# Patient Record
Sex: Female | Born: 1940
Health system: Southern US, Community
[De-identification: ages and names within clinical notes are randomized; demographics above are authoritative.]

## PROBLEM LIST (undated history)

## (undated) DIAGNOSIS — H9190 Unspecified hearing loss, unspecified ear: Secondary | ICD-10-CM

## (undated) DIAGNOSIS — J4489 Other specified chronic obstructive pulmonary disease: Secondary | ICD-10-CM

## (undated) DIAGNOSIS — I10 Essential (primary) hypertension: Secondary | ICD-10-CM

## (undated) DIAGNOSIS — K589 Irritable bowel syndrome without diarrhea: Secondary | ICD-10-CM

## (undated) DIAGNOSIS — N952 Postmenopausal atrophic vaginitis: Secondary | ICD-10-CM

## (undated) DIAGNOSIS — N809 Endometriosis, unspecified: Secondary | ICD-10-CM

## (undated) DIAGNOSIS — K219 Gastro-esophageal reflux disease without esophagitis: Secondary | ICD-10-CM

## (undated) DIAGNOSIS — F419 Anxiety disorder, unspecified: Secondary | ICD-10-CM

## (undated) DIAGNOSIS — J449 Chronic obstructive pulmonary disease, unspecified: Secondary | ICD-10-CM

## (undated) DIAGNOSIS — G43909 Migraine, unspecified, not intractable, without status migrainosus: Secondary | ICD-10-CM

## (undated) DIAGNOSIS — IMO0002 Reserved for concepts with insufficient information to code with codable children: Secondary | ICD-10-CM

## (undated) DIAGNOSIS — E785 Hyperlipidemia, unspecified: Secondary | ICD-10-CM

## (undated) DIAGNOSIS — D649 Anemia, unspecified: Secondary | ICD-10-CM

## (undated) DIAGNOSIS — K5730 Diverticulosis of large intestine without perforation or abscess without bleeding: Secondary | ICD-10-CM

## (undated) DIAGNOSIS — J309 Allergic rhinitis, unspecified: Secondary | ICD-10-CM

## (undated) DIAGNOSIS — Z8679 Personal history of other diseases of the circulatory system: Secondary | ICD-10-CM

## (undated) DIAGNOSIS — J45909 Unspecified asthma, uncomplicated: Secondary | ICD-10-CM

## (undated) DIAGNOSIS — M810 Age-related osteoporosis without current pathological fracture: Secondary | ICD-10-CM

## (undated) HISTORY — DX: Age-related osteoporosis without current pathological fracture: M81.0

## (undated) HISTORY — DX: Unspecified asthma, uncomplicated: J45.909

## (undated) HISTORY — DX: Allergic rhinitis, unspecified: J30.9

## (undated) HISTORY — DX: Irritable bowel syndrome, unspecified: K58.9

## (undated) HISTORY — DX: Chronic obstructive pulmonary disease, unspecified: J44.9

## (undated) HISTORY — DX: Reserved for concepts with insufficient information to code with codable children: IMO0002

## (undated) HISTORY — DX: Essential (primary) hypertension: I10

## (undated) HISTORY — DX: Gastro-esophageal reflux disease without esophagitis: K21.9

## (undated) HISTORY — DX: Anemia, unspecified: D64.9

## (undated) HISTORY — DX: Endometriosis, unspecified: N80.9

## (undated) HISTORY — DX: Diverticulosis of large intestine without perforation or abscess without bleeding: K57.30

## (undated) HISTORY — DX: Personal history of other diseases of the circulatory system: Z86.79

## (undated) HISTORY — DX: Anxiety disorder, unspecified: F41.9

## (undated) HISTORY — DX: Other specified chronic obstructive pulmonary disease: J44.89

## (undated) HISTORY — DX: Postmenopausal atrophic vaginitis: N95.2

## (undated) HISTORY — DX: Unspecified hearing loss, unspecified ear: H91.90

## (undated) HISTORY — PX: CATARACT EXTRACTION, BILATERAL: SHX1313

## (undated) HISTORY — DX: Hyperlipidemia, unspecified: E78.5

## (undated) HISTORY — DX: Migraine, unspecified, not intractable, without status migrainosus: G43.909

---
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
## 1972-12-31 HISTORY — PX: ABDOMINAL HYSTERECTOMY: SHX81

## 1984-01-01 HISTORY — PX: BREAST SURGERY: SHX581

## 1998-03-28 ENCOUNTER — Other Ambulatory Visit: Admission: RE | Admit: 1998-03-28 | Discharge: 1998-03-28 | Payer: Self-pay | Admitting: Obstetrics & Gynecology

## 1999-06-06 ENCOUNTER — Ambulatory Visit (HOSPITAL_COMMUNITY): Admission: RE | Admit: 1999-06-06 | Discharge: 1999-06-06 | Payer: Self-pay | Admitting: Internal Medicine

## 1999-07-25 ENCOUNTER — Ambulatory Visit (HOSPITAL_COMMUNITY): Admission: RE | Admit: 1999-07-25 | Discharge: 1999-07-25 | Payer: Self-pay | Admitting: Obstetrics & Gynecology

## 2002-12-07 ENCOUNTER — Encounter: Admission: RE | Admit: 2002-12-07 | Discharge: 2002-12-07 | Payer: Self-pay | Admitting: *Deleted

## 2002-12-07 ENCOUNTER — Encounter: Payer: Self-pay | Admitting: *Deleted

## 2002-12-11 ENCOUNTER — Encounter: Payer: Self-pay | Admitting: *Deleted

## 2002-12-11 ENCOUNTER — Ambulatory Visit (HOSPITAL_COMMUNITY): Admission: RE | Admit: 2002-12-11 | Discharge: 2002-12-11 | Payer: Self-pay | Admitting: *Deleted

## 2003-05-12 ENCOUNTER — Encounter: Payer: Self-pay | Admitting: Endocrinology

## 2003-05-12 ENCOUNTER — Ambulatory Visit (HOSPITAL_COMMUNITY): Admission: RE | Admit: 2003-05-12 | Discharge: 2003-05-12 | Payer: Self-pay | Admitting: Endocrinology

## 2003-06-04 ENCOUNTER — Encounter: Payer: Self-pay | Admitting: Neurosurgery

## 2003-06-04 ENCOUNTER — Ambulatory Visit (HOSPITAL_COMMUNITY): Admission: RE | Admit: 2003-06-04 | Discharge: 2003-06-04 | Payer: Self-pay | Admitting: Neurosurgery

## 2004-08-24 ENCOUNTER — Ambulatory Visit (HOSPITAL_COMMUNITY): Admission: RE | Admit: 2004-08-24 | Discharge: 2004-08-24 | Payer: Self-pay | Admitting: Gastroenterology

## 2005-05-16 ENCOUNTER — Encounter (INDEPENDENT_AMBULATORY_CARE_PROVIDER_SITE_OTHER): Payer: Self-pay | Admitting: Specialist

## 2005-05-16 ENCOUNTER — Ambulatory Visit (HOSPITAL_COMMUNITY): Admission: RE | Admit: 2005-05-16 | Discharge: 2005-05-16 | Payer: Self-pay | Admitting: Gastroenterology

## 2006-12-26 ENCOUNTER — Encounter: Admission: RE | Admit: 2006-12-26 | Discharge: 2007-02-03 | Payer: Self-pay | Admitting: Family Medicine

## 2008-03-29 ENCOUNTER — Encounter: Payer: Self-pay | Admitting: Internal Medicine

## 2008-03-29 LAB — CONVERTED CEMR LAB
ALT: 14 units/L
BUN: 18 mg/dL
Calcium: 9.1 mg/dL
Glucose, Bld: 96 mg/dL
Hemoglobin: 13.1 g/dL
Platelets: 243 10*3/uL
RBC: 4.09 M/uL
TSH: 2.57 microintl units/mL
Total Bilirubin: 0.8 mg/dL
Total Protein: 6.3 g/dL
WBC: 5.5 10*3/uL

## 2008-04-02 ENCOUNTER — Encounter: Payer: Self-pay | Admitting: Internal Medicine

## 2008-04-06 ENCOUNTER — Encounter: Payer: Self-pay | Admitting: Internal Medicine

## 2008-04-08 ENCOUNTER — Encounter: Payer: Self-pay | Admitting: Internal Medicine

## 2008-04-26 ENCOUNTER — Encounter: Payer: Self-pay | Admitting: Internal Medicine

## 2008-05-17 ENCOUNTER — Ambulatory Visit (HOSPITAL_COMMUNITY): Admission: RE | Admit: 2008-05-17 | Discharge: 2008-05-17 | Payer: Self-pay | Admitting: Family Medicine

## 2008-09-13 ENCOUNTER — Encounter: Payer: Self-pay | Admitting: Internal Medicine

## 2008-11-12 ENCOUNTER — Encounter: Payer: Self-pay | Admitting: Internal Medicine

## 2009-05-16 ENCOUNTER — Encounter: Payer: Self-pay | Admitting: Internal Medicine

## 2009-07-18 ENCOUNTER — Emergency Department (HOSPITAL_COMMUNITY): Admission: EM | Admit: 2009-07-18 | Discharge: 2009-07-18 | Payer: Self-pay | Admitting: Emergency Medicine

## 2009-08-03 ENCOUNTER — Encounter: Payer: Self-pay | Admitting: Internal Medicine

## 2009-08-03 LAB — CONVERTED CEMR LAB
ALT: 22 units/L
AST: 24 units/L
BUN: 12 mg/dL
Chloride: 106 meq/L
Cholesterol: 115 mg/dL
HDL: 39 mg/dL
LDL Cholesterol: 63 mg/dL
Potassium: 4.2 meq/L
Sodium: 144 meq/L
Total Protein: 6.8 g/dL

## 2009-09-28 ENCOUNTER — Encounter: Payer: Self-pay | Admitting: Internal Medicine

## 2010-07-13 ENCOUNTER — Ambulatory Visit: Payer: Self-pay | Admitting: Internal Medicine

## 2010-07-13 DIAGNOSIS — J45909 Unspecified asthma, uncomplicated: Secondary | ICD-10-CM

## 2010-07-13 DIAGNOSIS — I10 Essential (primary) hypertension: Secondary | ICD-10-CM | POA: Insufficient documentation

## 2010-07-13 DIAGNOSIS — Z8679 Personal history of other diseases of the circulatory system: Secondary | ICD-10-CM | POA: Insufficient documentation

## 2010-07-13 DIAGNOSIS — K219 Gastro-esophageal reflux disease without esophagitis: Secondary | ICD-10-CM | POA: Insufficient documentation

## 2010-07-13 DIAGNOSIS — N809 Endometriosis, unspecified: Secondary | ICD-10-CM | POA: Insufficient documentation

## 2010-07-13 DIAGNOSIS — D649 Anemia, unspecified: Secondary | ICD-10-CM | POA: Insufficient documentation

## 2010-07-13 DIAGNOSIS — E785 Hyperlipidemia, unspecified: Secondary | ICD-10-CM | POA: Insufficient documentation

## 2010-07-13 DIAGNOSIS — J309 Allergic rhinitis, unspecified: Secondary | ICD-10-CM

## 2010-07-13 DIAGNOSIS — J449 Chronic obstructive pulmonary disease, unspecified: Secondary | ICD-10-CM

## 2010-07-13 DIAGNOSIS — H9190 Unspecified hearing loss, unspecified ear: Secondary | ICD-10-CM

## 2010-07-13 DIAGNOSIS — J4489 Other specified chronic obstructive pulmonary disease: Secondary | ICD-10-CM | POA: Insufficient documentation

## 2010-07-13 DIAGNOSIS — G43909 Migraine, unspecified, not intractable, without status migrainosus: Secondary | ICD-10-CM | POA: Insufficient documentation

## 2010-07-13 HISTORY — DX: Unspecified hearing loss, unspecified ear: H91.90

## 2010-07-26 ENCOUNTER — Encounter: Payer: Self-pay | Admitting: Internal Medicine

## 2010-07-31 ENCOUNTER — Telehealth (INDEPENDENT_AMBULATORY_CARE_PROVIDER_SITE_OTHER): Payer: Self-pay | Admitting: *Deleted

## 2010-08-03 ENCOUNTER — Encounter: Payer: Self-pay | Admitting: Internal Medicine

## 2010-08-04 ENCOUNTER — Encounter: Payer: Self-pay | Admitting: Internal Medicine

## 2010-08-08 ENCOUNTER — Ambulatory Visit: Payer: Self-pay | Admitting: Internal Medicine

## 2010-08-08 ENCOUNTER — Encounter: Payer: Self-pay | Admitting: Internal Medicine

## 2010-08-09 ENCOUNTER — Encounter: Payer: Self-pay | Admitting: Internal Medicine

## 2010-08-16 DIAGNOSIS — M810 Age-related osteoporosis without current pathological fracture: Secondary | ICD-10-CM | POA: Insufficient documentation

## 2010-08-24 ENCOUNTER — Ambulatory Visit: Payer: Self-pay | Admitting: Internal Medicine

## 2010-11-16 ENCOUNTER — Telehealth: Payer: Self-pay | Admitting: Internal Medicine

## 2010-11-16 ENCOUNTER — Ambulatory Visit: Payer: Self-pay | Admitting: Internal Medicine

## 2010-11-16 LAB — CONVERTED CEMR LAB
AST: 22 units/L (ref 0–37)
Albumin: 4.3 g/dL (ref 3.5–5.2)
Alkaline Phosphatase: 40 units/L (ref 39–117)
Bilirubin, Direct: 0.1 mg/dL (ref 0.0–0.3)
LDL Cholesterol: 65 mg/dL (ref 0–99)
Total CHOL/HDL Ratio: 3
Triglycerides: 79 mg/dL (ref 0.0–149.0)

## 2010-11-21 ENCOUNTER — Encounter: Payer: Self-pay | Admitting: Internal Medicine

## 2010-12-07 ENCOUNTER — Ambulatory Visit: Payer: Self-pay | Admitting: Internal Medicine

## 2011-01-28 LAB — CONVERTED CEMR LAB
ALT: 14 units/L
AST: 18 units/L
AST: 18 units/L
Albumin: 3.7 g/dL
Albumin: 3.8 g/dL
Albumin: 4.3 g/dL (ref 3.5–5.2)
Alkaline Phosphatase: 42 units/L (ref 39–117)
Basophils Absolute: 0 10*3/uL (ref 0.0–0.1)
Bilirubin, Direct: 0.1 mg/dL (ref 0.0–0.3)
CO2: 32 meq/L (ref 19–32)
Calcium: 9.3 mg/dL (ref 8.4–10.5)
Cholesterol: 255 mg/dL — ABNORMAL HIGH (ref 0–200)
Creatinine, Ser: 0.6 mg/dL (ref 0.4–1.2)
Eosinophils Absolute: 0.1 10*3/uL (ref 0.0–0.7)
Glucose, Bld: 85 mg/dL (ref 70–99)
HDL: 42 mg/dL
HDL: 44 mg/dL
LDL Cholesterol: 113 mg/dL
LDL Cholesterol: 131 mg/dL
LDL Cholesterol: 161 mg/dL
Lymphocytes Relative: 31.7 % (ref 12.0–46.0)
MCHC: 34.3 g/dL (ref 30.0–36.0)
Neutrophils Relative %: 55.4 % (ref 43.0–77.0)
Platelets: 212 10*3/uL (ref 150.0–400.0)
RDW: 12.7 % (ref 11.5–14.6)
Specific Gravity, Urine: 1.02 (ref 1.000–1.030)
Total Bilirubin: 1 mg/dL
Total Protein, Urine: NEGATIVE mg/dL
Total Protein: 6.1 g/dL
Total Protein: 6.3 g/dL
Triglyceride fasting, serum: 147 mg/dL
Triglycerides: 129 mg/dL (ref 0.0–149.0)
Urine Glucose: NEGATIVE mg/dL
Urobilinogen, UA: 0.2 (ref 0.0–1.0)

## 2011-01-30 NOTE — Assessment & Plan Note (Signed)
Summary: prolia injection/val/cd  Nurse Visit   Allergies: No Known Drug Allergies  Medication Administration  Injection # 1:    Medication: Prolia 60mg     Diagnosis: OSTEOPOROSIS (ICD-733.00)    Route: SQ    Site: L deltoid    Exp Date: 01/01/2012    Lot #: [id number]    Mfr: [employer]    Patient tolerated injection without complications    Given by: Margaret Pyle, CMA (December 07, 2010 9:53 AM)  Orders Added: 1)  Admin of Therapeutic Inj  intramuscular or subcutaneous [96372] 2)  Prolia 60mg  [J3590]

## 2011-01-30 NOTE — Medication Information (Signed)
Summary: Benefits/Prolia plus  Benefits/Prolia plus   Imported By: Lester [place] 11/27/2010 07:59:24  _____________________________________________________________________  External Attachment:    Type:   Image     Comment:   External Document

## 2011-01-30 NOTE — Assessment & Plan Note (Signed)
Summary: PER PT 6 WK FU  STC   Vital Signs:  Patient profile:   70 year old female Height:      58.5 inches (148.59 cm) Weight:      112.0 pounds (50.91 kg) O2 Sat:      97 % on Room air Temp:     97.8 degrees F (36.56 degrees C) oral Pulse rate:   77 / minute BP sitting:   120 / 68  (left arm) Cuff size:   regular  Vitals Entered By: Orlan Leavens RMA (August 24, 2010 10:42 AM)  O2 Flow:  Room air CC: 6 week follow-up Is Patient Diabetic? No Pain Assessment Patient in pain? no      Comments Pt want to discuss alendronate   Primary Care Provider:  Newt Lukes MD  CC:  6 week follow-up.  History of Present Illness: here for f/u  HTN - resumed lisinopril hct 06/2010 - reports compliance with ongoing medical treatment and no changes in medication dose or frequency. denies adverse side effects related to current therapy. no HA, CP or edema - no known CAD or CVA hx  dyslipidemia - on crestor 5mg  until running out 11/2009 - no problems on statin med but would prefer not to take if not needed - resumed 06/2010 due to FLP results - reports compliance with ongoing medical treatment and no changes in medication dose or frequency. denies adverse side effects related to current therapy.  no myalgia or GI problems  osteoporosis - results dexa 07/2010 reviewed and compared to 03/2008 - reluctant to take fosamax generic + Ca/D  - prev IV reclast x 1 dose - has bone and muscle aches but would try again if needed  Current Medications (verified): 1)  Lisinopril-Hydrochlorothiazide 10-12.5 Mg Tabs (Lisinopril-Hydrochlorothiazide) .Marland Kitchen.. 1 By Mouth Once Daily 2)  Caltrate 600+d Plus 600-400 Mg-Unit Tabs (Calcium Carbonate-Vit D-Min) .Marland Kitchen.. 1 By Mouth Two Times A Day 3)  Crestor 5 Mg Tabs (Rosuvastatin Calcium) .Marland Kitchen.. 1 By Mouth At Bedtime 4)  Alendronate Sodium 70 Mg Tabs (Alendronate Sodium) .Marland Kitchen.. 1 Tab By Mouth Once Weekly (Hold)  Allergies (verified): No Known Drug Allergies  Past  History:  Past Medical History: Allergic rhinitis Anemia-NOS Asthma COPD GERD Hyperlipidemia Hypertension Bundle branch block meniers dz osteoporosis - dexa 03/2008, 07/2010: -3.1 spine & hip  MD roster: derm - tafeen ortho -paul hand - sypher gyn - wein  Review of Systems  The patient denies anorexia, weight loss, chest pain, dyspnea on exertion, peripheral edema, and headaches.    Physical Exam  General:  short statured, alert, well-developed, well-nourished, and cooperative to examination.    Lungs:  normal respiratory effort, no intercostal retractions or use of accessory muscles; normal breath sounds bilaterally - no crackles and no wheezes.    Heart:  normal rate, regular rhythm, no murmur, and no rub. BLE without edema.   Impression & Recommendations:  Problem # 1:  HYPERTENSION (ICD-401.9) Assessment Improved  Her updated medication list for this problem includes:    Lisinopril-hydrochlorothiazide 10-12.5 Mg Tabs (Lisinopril-hydrochlorothiazide) .Marland Kitchen... 1 by mouth once daily  BP today: 120/68 Prior BP: 142/88 (07/13/2010)  Labs Reviewed: K+: 4.4 (07/13/2010) Creat: : 0.6 (07/13/2010)   Chol: 255 (07/13/2010)   HDL: 48.80 (07/13/2010)   LDL: 63 (08/03/2009)   TG: 129.0 (07/13/2010)  Problem # 2:  HYPERLIPIDEMIA (ICD-272.4)  Her updated medication list for this problem includes:    Crestor 5 Mg Tabs (Rosuvastatin calcium) .Marland Kitchen... 1 by mouth at bedtime  prev on crestor 5 until running out 11/2009- resumes same due to results of cpx lipids/lfts -  Labs Reviewed: SGOT: 19 (07/13/2010)   SGPT: 14 (07/13/2010)   HDL:48.80 (07/13/2010), 39 (08/03/2009)  LDL:63 (08/03/2009), 161 (01/08/3234)  Chol:255 (07/13/2010), 115 (08/03/2009)  Trig:129.0 (07/13/2010), 117 (08/03/2009)  Problem # 3:  OSTEOPOROSIS (ICD-733.00) reviewed prior DEXA and tx options -  slight improvement in dexa s/p single reclast IV but still osteoporotic - reluctant to take meds due to concern  over poss SE - will consider prolia or repeat reclast - information and education provided on each -  pt will call with her descion on this but leaning towards prolia The following medications were removed from the medication list:    Alendronate Sodium 70 Mg Tabs (Alendronate sodium) .Marland Kitchen... 1 tab by mouth once weekly Her updated medication list for this problem includes:    Prolia 60 Mg/ml Soln (Denosumab) ..... Subcutaneously every 6 months  Complete Medication List: 1)  Lisinopril-hydrochlorothiazide 10-12.5 Mg Tabs (Lisinopril-hydrochlorothiazide) .Marland Kitchen.. 1 by mouth once daily 2)  Caltrate 600+d Plus 600-400 Mg-unit Tabs (Calcium carbonate-vit d-min) .Marland Kitchen.. 1 by mouth two times a day 3)  Crestor 5 Mg Tabs (Rosuvastatin calcium) .Marland Kitchen.. 1 by mouth at bedtime 4)  Prolia 60 Mg/ml Soln (Denosumab) .... Subcutaneously every 6 months  Patient Instructions: 1)  it was good to see you today. 2)  blood pressure much better - continue same  medications 3)  consider Prolia vs Reclast for your bones - information provided to review - call our office with your descison and we will arrange this treatment 4)  Please schedule a follow-up appointment in 3-6 months to monitor blood pressure and cholesterol, call sooner if problems.

## 2011-01-30 NOTE — Assessment & Plan Note (Signed)
Summary: 3-6 MTH FU---STC   Vital Signs:  Patient profile:   70 year old female Height:      58.5 inches (148.59 cm) Weight:      112.0 pounds (50.91 kg) Temp:     98.3 degrees F (36.83 degrees C) oral Pulse rate:   87 / minute BP sitting:   110 / 60  (left arm) Cuff size:   regular  Vitals Entered By: Orlan Leavens RMA (November 16, 2010 9:01 AM) CC: 3 month follow-up Is Patient Diabetic? No Pain Assessment Patient in pain? no      Comments Req refill on meds   Primary Care Provider:  Newt Lukes MD  CC:  3 month follow-up.  History of Present Illness: here for f/u  HTN - resumed lisinopril hct 06/2010 - reports compliance with ongoing medical treatment and no changes in medication dose or frequency. denies adverse side effects related to current therapy. no HA, CP or edema - no known CAD or CVA hx  dyslipidemia - on crestor 5mg  until running out 11/2009 - no problems on statin med but would prefer not to take if not needed - resumed 06/2010 due to FLP results - reports compliance with ongoing medical treatment and no changes in medication dose or frequency. denies adverse side effects related to current therapy.  no myalgia or GI problems  osteoporosis - results dexa 07/2010 reviewed and compared to 03/2008 - reluctant to take fosamax generic + Ca/D  - prev IV reclast x 1 dose - has bone and muscle aches but would try again if needed but prefers prolia at this time  Clinical Review Panels:  Lipid Management   Cholesterol:  255 (07/13/2010)   LDL (bad choesterol):  63 (08/03/2009)   HDL (good cholesterol):  48.80 (07/13/2010)   Triglycerides:  117 (08/03/2009)  CBC   WBC:  4.5 (07/13/2010)   RBC:  3.97 (07/13/2010)   Hgb:  13.2 (07/13/2010)   Hct:  38.5 (07/13/2010)   Platelets:  212.0 (07/13/2010)   MCV  97.1 (07/13/2010)   MCHC  34.3 (07/13/2010)   RDW  12.7 (07/13/2010)   PMN:  55.4 (07/13/2010)   Lymphs:  31.7 (07/13/2010)   Monos:  10.7 (07/13/2010)  Eosinophils:  1.7 (07/13/2010)   Basophil:  0.5 (07/13/2010)  Complete Metabolic Panel   Glucose:  85 (07/13/2010)   Sodium:  140 (07/13/2010)   Potassium:  4.4 (07/13/2010)   Chloride:  103 (07/13/2010)   CO2:  32 (07/13/2010)   BUN:  19 (07/13/2010)   Creatinine:  0.6 (07/13/2010)   Albumin:  4.3 (07/13/2010)   Total Protein:  6.8 (07/13/2010)   Calcium:  9.3 (07/13/2010)   Total Bili:  1.0 (07/13/2010)   Alk Phos:  42 (07/13/2010)   SGPT (ALT):  14 (07/13/2010)   SGOT (AST):  19 (07/13/2010)   Current Medications (verified): 1)  Lisinopril-Hydrochlorothiazide 10-12.5 Mg Tabs (Lisinopril-Hydrochlorothiazide) .Marland Kitchen.. 1 By Mouth Once Daily 2)  Caltrate 600+d Plus 600-400 Mg-Unit Tabs (Calcium Carbonate-Vit D-Min) .Marland Kitchen.. 1 By Mouth Two Times A Day 3)  Crestor 5 Mg Tabs (Rosuvastatin Calcium) .Marland Kitchen.. 1 By Mouth At Bedtime 4)  Prolia 60 Mg/ml Soln (Denosumab) .... Subcutaneously Every 6 Months  Allergies (verified): No Known Drug Allergies  Past History:  Past Medical History: Allergic rhinitis Anemia-NOS Asthma COPD GERD Hyperlipidemia Hypertension Bundle branch block meniers dz osteoporosis - dexa 03/2008, 07/2010: -3.1 spine & hip  MD roster:  derm - tafeen ortho -paul hand - sypher gyn - wein  Review of Systems  The patient denies fever, weight loss, weight gain, chest pain, and syncope.    Physical Exam  General:  short statured, alert, well-developed, well-nourished, and cooperative to examination.    Lungs:  normal respiratory effort, no intercostal retractions or use of accessory muscles; normal breath sounds bilaterally - no crackles and no wheezes.    Heart:  normal rate, regular rhythm, no murmur, and no rub. BLE without edema.   Impression & Recommendations:  Problem # 1:  HYPERLIPIDEMIA (ICD-272.4)  Her updated medication list for this problem includes:    Crestor 5 Mg Tabs (Rosuvastatin calcium) .Marland Kitchen... 1 by mouth at bedtime  prev on crestor 5 until  running out 11/2009- resumed same due to results of 06/2010 cpx lipids/lfts - recheck now and adjust dose if needed before sending refills  Labs Reviewed: SGOT: 19 (07/13/2010)   SGPT: 14 (07/13/2010)   HDL:48.80 (07/13/2010), 39 (08/03/2009)  LDL:63 (08/03/2009), 161 (84/13/2440)  Chol:255 (07/13/2010), 115 (08/03/2009)  Trig:129.0 (07/13/2010), 117 (08/03/2009)  Orders: TLB-Lipid Panel (80061-LIPID)  Problem # 2:  OSTEOPOROSIS (ICD-733.00)  Her updated medication list for this problem includes:    Prolia 60 Mg/ml Soln (Denosumab) ..... Subcutaneously every 6 months  reviewed 2011 DEXA and tx options -  slight improvement in dexa s/p single reclast IV 2010 but still osteoporotic - reluctant to take meds due to concern over poss SE - has chosen prolia instead of repeat reclast - will arrange to administer now  Problem # 3:  HYPERTENSION (ICD-401.9)  Her updated medication list for this problem includes:    Lisinopril-hydrochlorothiazide 10-12.5 Mg Tabs (Lisinopril-hydrochlorothiazide) .Marland Kitchen... 1 by mouth once daily  BP today: 110/60 Prior BP: 120/68 (08/24/2010) Prior BP: 142/88 (07/13/2010)  Labs Reviewed: K+: 4.4 (07/13/2010) Creat: : 0.6 (07/13/2010)   Chol: 255 (07/13/2010)   HDL: 48.80 (07/13/2010)   LDL: 63 (08/03/2009)   TG: 129.0 (07/13/2010)  Complete Medication List: 1)  Lisinopril-hydrochlorothiazide 10-12.5 Mg Tabs (Lisinopril-hydrochlorothiazide) .Marland Kitchen.. 1 by mouth once daily 2)  Caltrate 600+d Plus 600-400 Mg-unit Tabs (Calcium carbonate-vit d-min) .Marland Kitchen.. 1 by mouth two times a day 3)  Crestor 5 Mg Tabs (Rosuvastatin calcium) .Marland Kitchen.. 1 by mouth at bedtime 4)  Prolia 60 Mg/ml Soln (Denosumab) .... Subcutaneously every 6 months  Other Orders: TLB-Hepatic/Liver Function Pnl (80076-HEPATIC)  Patient Instructions: 1)  it was good to see you today. 2)  will arrange for Prolia injection - out office will call you once onsurance gives approval and we have the medication in  stock - 3)  test(s) ordered today - your results will be posted on the phone tree for review in 48-72 hours from the time of test completion; call [phone] and enter your 9 digit MRN (listed above on this page, just below your name); if any changes need to be made or there are abnormal results, you will be contacted directly.  4)  Please schedule a follow-up appointment in 6 months to monitor blood pressure and cholesterol (and 2nd Prloia injection), call sooner if problems.  Prescriptions: CRESTOR 5 MG TABS (ROSUVASTATIN CALCIUM) 1 by mouth at bedtime  #30 x 6   Entered by:   Orlan Leavens RMA   Authorized by:   Newt Lukes MD   Signed by:   Orlan Leavens RMA on 11/16/2010   Method used:   Electronically to        [facility]. [phone]* (retail)       [address]  [place], [place]  [address]       Ph: [phone]       Fax: [phone]   RxID:   [id number] LISINOPRIL-HYDROCHLOROTHIAZIDE 10-12.5 MG TABS (LISINOPRIL-HYDROCHLOROTHIAZIDE) 1 by mouth once daily  #30 x 6   Entered by:   Orlan Leavens RMA   Authorized by:   Newt Lukes MD   Signed by:   Orlan Leavens RMA on 11/16/2010   Method used:   Electronically to        [facility]. [phone]* (retail)       [address]       [place], [place]  [address]       Ph: [phone]       Fax: [phone]   RxID:   [id number]    Orders Added: 1)  Est. Patient Level IV [60630] 2)  TLB-Lipid Panel [80061-LIPID] 3)  TLB-Hepatic/Liver Function Pnl [80076-HEPATIC]

## 2011-01-30 NOTE — Progress Notes (Signed)
Summary: Prolia  Phone Note Outgoing Call   Summary of Call: faxed ins verification request to [employer].......Marland Kitchenwill wait for summary of benefits Initial call taken by: Lanier Prude, [facility]),  November 16, 2010 1:24 PM  Follow-up for Phone Call        rec fax back that pt would owe 20%=approx $165.00.  No prior auth req.  pt informed.....she will call back when she is in town to sched inj. Follow-up by: Lanier Prude, [facility]),  November 22, 2010 8:50 AM  Additional Follow-up for Phone Call Additional follow up Details #1::        ok - thanks Additional Follow-up by: Newt Lukes MD,  November 25, 2010 3:12 PM

## 2011-01-30 NOTE — Miscellaneous (Signed)
Summary: BONE DENSITY  Clinical Lists Changes  Orders: Added new Test order of T-Bone Densitometry (77080) - Signed Added new Test order of T-Lumbar Vertebral Assessment (77082) - Signed 

## 2011-01-30 NOTE — Letter (Signed)
Summary: [facility] Screws [facility]   Imported By: Lester [place] 08/09/2010 10:45:42  _____________________________________________________________________  External Attachment:    Type:   Image     Comment:   External Document

## 2011-01-30 NOTE — Letter (Signed)
[facility]: [facility]   Imported By: Lester [place] 08/09/2010 10:34:02  _____________________________________________________________________  External Attachment:    Type:   Image     Comment:   External Document

## 2011-01-30 NOTE — Procedures (Signed)
Summary: [facility]  [facility]   Imported By: Lester [place] 08/09/2010 10:36:58  _____________________________________________________________________  External Attachment:    Type:   Image     Comment:   External Document

## 2011-01-30 NOTE — Therapy (Signed)
Summary: [facility]  [facility]   Imported By: Lennie Odor 08/02/2010 11:10:42  _____________________________________________________________________  External Attachment:    Type:   Image     Comment:   External Document

## 2011-01-30 NOTE — Assessment & Plan Note (Signed)
Summary: NEW MEDICARE PT--PKG--#--STC   Vital Signs:  Patient profile:   70 year old female Height:      58.5 inches (148.59 cm) Weight:      115.0 pounds (52.27 kg) BMI:     23.71 O2 Sat:      98 % on Room air Temp:     98.6 degrees F (37.00 degrees C) oral Pulse rate:   84 / minute BP sitting:   142 / 88  (left arm) Cuff size:   regular  Vitals Entered By: Orlan Leavens (July 13, 2010 9:22 AM)  O2 Flow:  Room air CC: New patient Is Patient Diabetic? No Pain Assessment Patient in pain? no        Primary Care Provider:  Newt Lukes MD  CC:  New patient.  History of Present Illness: new pt to me and our practice - here to est care also patient is here today for annual physical. Patient feels well and has no complaints.   also review other medical issues -  HTN - not on meds since 11/2009 due to running out - prev on lisinopril 10mg  as intol of many other - no edema or HA or CP - no known CAD or CVA hx  dyslipidemia - on crestor 5mg  until running out 11/2009 - no problems on statin med but would prefer not to take if not needed  Preventive Screening-Counseling & Management  Alcohol-Tobacco     Alcohol drinks/day: <1     Alcohol Counseling: not indicated; use of alcohol is not excessive or problematic     Smoking Status: quit  Caffeine-Diet-Exercise     Does Patient Exercise: no     Exercise Counseling: to improve exercise regimen     Depression Counseling: not indicated; screening negative for depression  Safety-Violence-Falls     Seat Belt Counseling: not indicated; patient wears seat belts     Helmet Counseling: not indicated; patient wears helmet when riding bicycle/motocycle     Firearms in the Home: firearms in the home     Firearm Counseling: not indicated; uses recommended firearm safety measures     Violence Counseling: not indicated; no violence risk noted     Fall Risk Counseling: not indicated; no significant falls noted  Clinical Review  Panels:  Immunizations   Last Tetanus Booster:  Historical (06/30/2009)   Current Medications (verified): 1)  None  Allergies (verified): No Known Drug Allergies  Past History:  Past Medical History: Allergic rhinitis Anemia-NOS Asthma COPD GERD Hyperlipidemia Hypertension Bundle branch block  MD roster: derm - tafeen ortho -paul hand - sypher gyn - wein  Past Surgical History: Appendectomy (6962) Hysterectomy (1974)  Breast biopsy (1985)  Family History: Family History of Arthritis (parent,grandparent) Family History Breast cancer 1st degree relative <50 (parent, other relative) Family History Diabetes 1st degree relative (parent, grandparent) Family History High cholesterol (parent)  mom expired 37s - met cancer dad expired 58s- suicide  Social History: Former Smoker - quits 1960s social alcohol married, 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 I have reviewed all other systems and they were negative.   Physical Exam  General:  short statured, alert, well-developed, well-nourished, and cooperative to examination.    Head:  Normocephalic and atraumatic without obvious abnormalities. No apparent alopecia or balding. Eyes:  vision grossly intact; pupils equal, round and reactive to light.  conjunctiva and lids normal.   wears corrective lenses (glasses) Ears:  normal pinnae bilaterally, without erythema, swelling, or tenderness to palpation. TMs clear, without effusion, or cerumen impaction. Hearing grossly normal bilaterally  Mouth:  teeth and gums in good repair; mucous membranes moist, without lesions or ulcers. oropharynx clear without exudate, no erythema.  Neck:  supple, full ROM, no masses, no thyromegaly; no thyroid nodules or tenderness. no JVD or carotid bruits.   Lungs:  normal  respiratory effort, no intercostal retractions or use of accessory muscles; normal breath sounds bilaterally - no crackles and no wheezes.    Heart:  normal rate, regular rhythm, no murmur, and no rub. BLE without edema. normal DP pulses and normal cap refill in all 4 extremities    Abdomen:  soft, non-tender, normal bowel sounds, no distention; no masses and no appreciable hepatomegaly or splenomegaly.   Genitalia:  defer to gyn Msk:  No deformity or scoliosis noted of thoracic or lumbar spine.   Neurologic:  alert & oriented X3 and cranial nerves II-XII symetrically intact.  strength normal in all extremities, sensation intact to light touch, and gait normal. speech fluent without dysarthria or aphasia; follows commands with good comprehension.  Skin:  no rashes, vesicles, ulcers, or erythema. No nodules or irregularity to palpation.  Psych:  Oriented X3, memory intact for recent and remote, normally interactive, good eye contact, not anxious appearing, not depressed appearing, and not agitated.      Impression & Recommendations:  Problem # 1:  PREVENTIVE HEALTH CARE (ICD-V70.0)  Patient has been counseled on age-appropriate routine health concerns for screening and prevention. These are reviewed and up-to-date. Immunizations are up-to-date or declined. Labs and ECG reviewed.   Orders: EKG w/ Interpretation (93000) TLB-Lipid Panel (80061-LIPID) TLB-BMP (Basic Metabolic Panel-BMET) (80048-METABOL) TLB-CBC Platelet - w/Differential (85025-CBCD) TLB-Hepatic/Liver Function Pnl (80076-HEPATIC) TLB-TSH (Thyroid Stimulating Hormone) (84443-TSH) TLB-Udip w/ Micro (81001-URINE) T-Bone Densitometry (64332) T-Lumbar Vertebral Assessment (95188) Misc. Referral (Misc. Ref)  Problem # 2:  HYPERTENSION (ICD-401.9)  resume tx - chose ACEI with diuretic as reports not to goail with ACEI alone - send for prior records to compare and review other med trials Her updated medication list for this problem  includes:    Lisinopril-hydrochlorothiazide 10-12.5 Mg Tabs (Lisinopril-hydrochlorothiazide) .Marland Kitchen... 1 by mouth once daily  Orders: Prescription Created Electronically [phone])  BP today: 142/88  Problem # 3:  HYPERLIPIDEMIA (ICD-272.4) prev on crestor 5 until running out 11/2009- expect need to resume but await results of cpx lipids/lfts - also compare to prior labs from prior pcp - records requested  Problem # 4:  HEARING LOSS (ICD-389.9)  Orders: Audiology (Audio)  Complete Medication List: 1)  Lisinopril-hydrochlorothiazide 10-12.5 Mg Tabs (Lisinopril-hydrochlorothiazide) .Marland Kitchen.. 1 by mouth once daily 2)  Caltrate 600+d Plus 600-400 Mg-unit Tabs (Calcium carbonate-vit d-min) .Marland Kitchen.. 1 by mouth two times a day  Patient Instructions: 1)  it was good to see you today. 2)  exam and EKG look good - 3)  start lisinoprilHCTZ for your blood pressure -your prescription has been electronically submitted to your pharmacy. Please take as directed. Contact our office if you believe you're having problems with the medication(s).  4)  also take Caltrate plus two times a day for calcium and vit D 5)  will send for records from dr. Recardo Evangelist office to review 6)  test(s) ordered today - your results will be posted on the phone tree for review in 48-72  hours from the time of test completion; call [phone] and enter your 9 digit MRN (listed above on this page, just below your name); if any changes need to be made or there are abnormal results, you will be contacted directly.  7)  we'll make referral for bone density screen, mammography, and hearing evaluation. Our office will contact you regarding these appointments once made.  8)  Please schedule a follow-up appointment in 6-12 weeks to review blood [pressure and medications, call sooner if problems.  Prescriptions: LISINOPRIL-HYDROCHLOROTHIAZIDE 10-12.5 MG TABS (LISINOPRIL-HYDROCHLOROTHIAZIDE) 1 by mouth once daily  #30 x 3   Entered and Authorized by:    Newt Lukes MD   Signed by:   Newt Lukes MD on 07/13/2010   Method used:   Electronically to        [facility]. [phone]* (retail)       [address]       [place], [place]  [address]       Ph: [phone]       Fax: [phone]   RxID:   [id number]    Immunization History:  Tetanus/Td Immunization History:    Tetanus/Td:  historical (06/30/2009)

## 2011-01-30 NOTE — Progress Notes (Signed)
  Phone Note Other Incoming   Request: Send information Summary of Call: Records received from Dr. Abigail Miyamoto. 59 pages forwarded to Dr. Felicity Coyer for her to review.

## 2011-04-24 ENCOUNTER — Encounter: Payer: Self-pay | Admitting: Internal Medicine

## 2011-04-25 ENCOUNTER — Ambulatory Visit (INDEPENDENT_AMBULATORY_CARE_PROVIDER_SITE_OTHER): Payer: Medicare Other | Admitting: Internal Medicine

## 2011-04-25 ENCOUNTER — Encounter: Payer: Self-pay | Admitting: Internal Medicine

## 2011-04-25 VITALS — BP 100/60 | HR 85 | Temp 99.3°F

## 2011-04-25 DIAGNOSIS — J441 Chronic obstructive pulmonary disease with (acute) exacerbation: Secondary | ICD-10-CM

## 2011-04-25 MED ORDER — PREDNISONE (PAK) 10 MG PO TABS: 10.0000 mg | ORAL_TABLET | ORAL | Status: AC

## 2011-04-25 MED ORDER — DOXYCYCLINE HYCLATE 100 MG PO TABS: 100.0000 mg | ORAL_TABLET | Freq: Two times a day (BID) | ORAL | Status: AC

## 2011-04-25 NOTE — Patient Instructions (Signed)
It was good to see you today. Doxycycline antibiotics and prednisone for wheeze and inflammation to help cough symptoms -  Your prescription(s) have been submitted to your pharmacy. Please take as directed and contact our office if you believe you are having problem(s) with the medication(s). If symptoms unimproved with treatment, call for re-evaluation, sooner if worse

## 2011-04-25 NOTE — Progress Notes (Signed)
  Subjective:    Patient ID: Regina Ramsey, female    DOB: 01/21/1941, 70 y.o.   MRN: [id number]  HPI complains of cough Onset 4 weeks ago associated with with allergies in past 4 months but now worse Min sputum but +chest congestion symptoms worse at night/supine Denies SOB or CP or Le swelling associated with wheeze and LGF  Also reviewed chronic medical issues:  HTN - resumed lisinopril hct 06/2010 - reports compliance with ongoing medical treatment and no changes in medication dose or frequency. denies adverse side effects related to current therapy. no HA, CP or edema - no known CAD or CVA hx  dyslipidemia - on crestor 5mg  until running out 11/2009 - no problems on statin med but would prefer not to take - resumed 06/2010 due to FLP results - reports compliance with ongoing medical treatment and no changes in medication dose or frequency. denies adverse side effects related to current therapy.  no myalgia or GI problems  osteoporosis - results dexa 07/2010 progression compared to 03/2008 - reluctant to take fosamax generic + Ca/D  - prev IV reclast x 1 dose >SE bone and muscle aches  - now s/p prolia 11/2010  Past Medical History  Diagnosis Date  . MIGRAINE HEADACHE   . HEARING LOSS 07/13/2010  . PALPITATIONS, HX OF   . ALLERGIC RHINITIS   . ANEMIA-NOS   . ASTHMA   . COPD   . HYPERTENSION   . HYPERLIPIDEMIA   . GERD   . OSTEOPOROSIS      Review of Systems  Constitutional: Negative for fever.  HENT: Positive for congestion.   Eyes: Negative for itching.  Respiratory: Positive for cough and wheezing. Negative for choking.   Cardiovascular: Negative for leg swelling.  Neurological: Negative for syncope and headaches.       Objective:   Physical Exam  Constitutional: She is oriented to person, place, and time. She appears well-developed and well-nourished.       Mildly ill appearing  HENT:  Head: Normocephalic and atraumatic.  Right Ear: External ear normal.  Nose:  Nose normal.  Mouth/Throat: No oropharyngeal exudate.  Eyes: Conjunctivae are normal. Pupils are equal, round, and reactive to light.  Neck: Normal range of motion. Neck supple. No thyromegaly present.  Cardiovascular: Normal rate, regular rhythm and normal heart sounds.   Pulmonary/Chest: Effort normal. No respiratory distress. She has wheezes. She has no rales.  Neurological: She is alert and oriented to person, place, and time. No cranial nerve deficit.  Psychiatric: She has a normal mood and affect. Her behavior is normal.   BP 100/60  Pulse 85  Temp(Src) 99.3 F (37.4 C) (Oral)  SpO2 90%     Lab Results  Component Value Date   WBC 4.5 07/13/2010   HGB 13.2 07/13/2010   HCT 38.5 07/13/2010   PLT 212.0 07/13/2010   CHOL 126 11/16/2010   TRIG 79.0 11/16/2010   HDL 44.90 11/16/2010   LDLDIRECT 193.0 07/13/2010   ALT 14 11/16/2010   AST 22 11/16/2010   NA 140 07/13/2010   K 4.4 07/13/2010   CL 103 07/13/2010   CREATININE 0.6 07/13/2010   BUN 19 07/13/2010   CO2 32 07/13/2010   TSH 2.06 07/13/2010   Assessment & Plan:  Acute COPD exac - tx pred for wheeze and inflammation and doxy for infection - no regular use of inhalers as no recent flares but prev rx'd same - may need to resume if frequent exac

## 2011-04-26 ENCOUNTER — Encounter: Payer: Self-pay | Admitting: Internal Medicine

## 2011-05-18 NOTE — Op Note (Signed)
NAME:  Regina Ramsey, Regina Ramsey NO.:  [id number]   MEDICAL RECORD NO.:  [id number]                   PATIENT TYPE:  AMB   LOCATION:  DFTL                                 FACILITY:  [facility]   PHYSICIAN:  Graylin Shiver, M.D.                DATE OF BIRTH:  05-19-1941   DATE OF PROCEDURE:  08/24/2004  DATE OF DISCHARGE:                                 OPERATIVE REPORT   PROCEDURE:  Colonoscopy.   SURGEON:   INDICATIONS FOR PROCEDURE:  Screening.   Informed consent was obtained after explanation of the risks of bleeding,  infection and perforation.   PREMEDICATION:  Fentanyl 100 mcg IV and Versed 7 mg IV.   DESCRIPTION OF PROCEDURE:  With the patient in the left lateral decubitus  position, a rectal examination was performed.  No masses were felt.  The  Olympus colonoscope was inserted into the rectum and advanced around the  colon to the cecum.  Cecal landmarks were identified.  The ileocecal valve  was intubated and the first few centimeters of terminal ileum were seen and  looked normal.  The cecum looked normal.  The ascending colon looked normal.  The transverse colon looked normal.  The descending colon, sigmoid and  rectum looked normal.  She tolerated the procedure well without  complications.   IMPRESSION:  Normal colonoscopy to the cecum.   I would recommend a follow-up screening colonoscopy again in 10 years.                                               Graylin Shiver, M.D.    [name]  D:  08/24/2004  T:  08/24/2004  Job:  [id number]   cc:   Chales Salmon. Abigail Miyamoto, M.D.  [address]  [place]  [place] [address]  Fax: [phone]

## 2011-05-18 NOTE — Op Note (Signed)
NAMECARINA, Regina Ramsey             ACCOUNT NO.:  [id number]   MEDICAL RECORD NO.:  [id number]          PATIENT TYPE:  AMB   LOCATION:  ENDO                         FACILITY:  [facility]   PHYSICIAN:  Graylin Shiver, M.D.   DATE OF BIRTH:  [birth year]   DATE OF PROCEDURE:  05/16/2005  DATE OF DISCHARGE:                                 OPERATIVE REPORT   INDICATIONS FOR PROCEDURE:  Heartburn, gastroesophageal reflux.  The  procedure is being done to look for any evidence of mucosal damage.   Informed consent was obtained after explanation of the risks of bleeding,  infection, and perforation.   ENDOSCOPIST:  Graylin Shiver, M.D.   PREMEDICATION:  Fentanyl 50 mcg IV, Versed 6 mg IV.   PROCEDURE:  With the patient in the left lateral decubitus position, the  Olympus gastroscope was inserted into the oropharynx and passed into the  esophagus. It was advanced down the esophagus, then into the stomach and  into the duodenum. The second portion and bulb of the duodenum looked  normal. The stomach showed erythema in the antrum compatible with a  nonerosive gastritis. Biopsies were obtained from the antrum and lower  stomach for histological inspection and to look for evidence of Helicobacter  pylori. No other mucosal abnormalities were noted in the stomach including  the upper fundus and cardia seen on retroflexion. The scope was then brought  back into the esophagus which revealed the esophagogastric junction to be at  33 cm from the incisor teeth region. The esophageal mucosa looked normal in  its entirety. She tolerated the procedure well without complications.   IMPRESSION:  1.  Mild antral gastritis.  2.  Hiatal hernia.   PLAN:  The biopsies will be checked to look for evidence of Helicobacter  pylori. The patient should remain on a proton pump inhibitor for control of  heartburn secondary to gastroesophageal reflux.      SFG/MEDQ  D:  05/16/2005  T:  05/16/2005  Job:  [id number]   cc:   Chales Salmon. Abigail Miyamoto, M.D.  [address]  [place]  [place] [address]  Fax: [phone]

## 2011-05-18 NOTE — Op Note (Signed)
   NAME:  Regina Ramsey, Regina Ramsey                       ACCOUNT NO.:  [id number]   MEDICAL RECORD NO.:  [id number]                   PATIENT TYPE:  AMB   LOCATION:  ENDO                                 FACILITY:  [facility]   PHYSICIAN:  Georgiana Spinner, M.D.                 DATE OF BIRTH:  1941-01-28   DATE OF PROCEDURE:  12/11/2002  DATE OF DISCHARGE:                                 OPERATIVE REPORT   PROCEDURE:  Esophageal dilation.   ANESTHESIA:  Demerol 50, Versed 5 mg. See previous note.   INDICATIONS FOR PROCEDURE:  Patient with reflux diagnosed by Dr. Arletha Grippe  currently on Nexium b.i.d. with persistent solid food dysphagia. Barium  swallow showed reflux only.   ANESTHESIA:  Demerol 50, Versed 5 mg.   DESCRIPTION OF PROCEDURE:  With the patient mildly sedated in the left  lateral decubitus position, the Olympus videoscopic endoscope was inserted  in the mouth, passed under direct vision through the esophagus which  appeared normal. There was no evidence of Barrett's esophagus. We entered  into the stomach. The fundus, body, antrum, duodenal bulb, second portion of  duodenum appeared normal. From this point, the endoscope was slowly  withdrawn taking circumferential views of the entire duodenal mucosa until  the endoscope was then pulled back in the stomach, placed in retroflexion to  view the stomach from below and a widely patent GE junction was seen and  photographed. The endoscope was then straightened and the guidewire was  passed. The endoscope was withdrawn taking circumferential views of the  remaining gastric and esophageal mucosa. Over the guidewire, 15 and 18  Savary dilators were passed with the latter. There was some blood tinging.  The endoscope was reinserted. There was some blood in the esophagus  indicating adequate dilation of the esophagus. The endoscope was withdrawn.  The patient's vital signs and pulse oximeter remained stable. The patient  tolerated the procedure  well without apparent complications.   FINDINGS:  Dysphagia with esophagus dilated to 15 and 18 Savary.   PLAN:  Await clinical response. The patient will followup with me as an  outpatient.                                               Georgiana Spinner, M.D.    GMO/MEDQ  D:  12/11/2002  T:  12/11/2002  Job:  [id number]   cc:   Veverly Fells. Arletha Grippe, M.D.  [address]  [place]  [place] [address]  Fax: [phone]

## 2011-06-06 ENCOUNTER — Telehealth: Payer: Self-pay | Admitting: *Deleted

## 2011-06-06 NOTE — Telephone Encounter (Signed)
Faxed ins verificatin req to [employer] for pt's upcoming Prolia inj due 06-08-11. Will wait for benefit summary then call pt when ready to schedule nurse visit.

## 2011-06-08 ENCOUNTER — Ambulatory Visit (INDEPENDENT_AMBULATORY_CARE_PROVIDER_SITE_OTHER): Payer: Medicare Other | Admitting: Internal Medicine

## 2011-06-08 ENCOUNTER — Ambulatory Visit (INDEPENDENT_AMBULATORY_CARE_PROVIDER_SITE_OTHER)
Admission: RE | Admit: 2011-06-08 | Discharge: 2011-06-08 | Disposition: A | Discharge status: Home / Self Care | Payer: Medicare Other | Source: Ambulatory Visit | Attending: Internal Medicine | Admitting: Internal Medicine

## 2011-06-08 ENCOUNTER — Encounter: Payer: Self-pay | Admitting: Internal Medicine

## 2011-06-08 VITALS — BP 130/72 | HR 91 | Temp 98.5°F | Ht 58.5 in

## 2011-06-08 DIAGNOSIS — J4489 Other specified chronic obstructive pulmonary disease: Secondary | ICD-10-CM

## 2011-06-08 DIAGNOSIS — J449 Chronic obstructive pulmonary disease, unspecified: Secondary | ICD-10-CM

## 2011-06-08 DIAGNOSIS — M810 Age-related osteoporosis without current pathological fracture: Secondary | ICD-10-CM

## 2011-06-08 MED ORDER — ALBUTEROL SULFATE HFA 108 (90 BASE) MCG/ACT IN AERS: 2.0000 | INHALATION_SPRAY | Freq: Four times a day (QID) | RESPIRATORY_TRACT | Status: DC | PRN

## 2011-06-08 NOTE — Progress Notes (Signed)
Subjective:    Patient ID: Regina Ramsey, female    DOB: 07-12-1941, 70 y.o.   MRN: [id number]  HPI  complains of cough Onset 4 weeks ago associated with with allergies in past 4 months but now worse Min sputum but +chest congestion symptoms worse at night/supine Denies SOB or CP or Le swelling associated with wheeze and LGF  Also reviewed chronic medical issues:  HTN - resumed lisinopril hct 06/2010 - reports compliance with ongoing medical treatment and no changes in medication dose or frequency. denies adverse side effects related to current therapy. no HA, CP or edema - no known CAD or CVA hx  dyslipidemia - on crestor 5mg  until running out 11/2009 - no problems on statin med but would prefer not to take - resumed 06/2010 due to FLP results - reports compliance with ongoing medical treatment and no changes in medication dose or frequency. denies adverse side effects related to current therapy.  no myalgia or GI problems  osteoporosis - results dexa 07/2010 progression compared to 03/2008 - reluctant to take fosamax generic + Ca/D  - prev IV reclast x 1 dose >SE bone and muscle aches  - now s/p prolia 11/2010  Past Medical History  Diagnosis Date  . MIGRAINE HEADACHE   . HEARING LOSS 07/13/2010  . PALPITATIONS, HX OF   . ALLERGIC RHINITIS   . ANEMIA-NOS   . ASTHMA   . COPD   . HYPERTENSION   . HYPERLIPIDEMIA   . GERD   . OSTEOPOROSIS      Review of Systems  Constitutional: Negative for fever.  HENT: Positive for congestion.   Eyes: Negative for itching.  Respiratory: Positive for cough and wheezing. Negative for choking.   Cardiovascular: Negative for leg swelling.  Neurological: Negative for syncope and headaches.       Objective:   Physical Exam  Constitutional: She is oriented to person, place, and time. She appears well-developed and well-nourished.  HENT:  Head: Normocephalic and atraumatic.  Right Ear: External ear normal.  Nose: Nose normal.    Mouth/Throat: No oropharyngeal exudate.  Eyes: Conjunctivae are normal. Pupils are equal, round, and reactive to light.  Neck: Normal range of motion. Neck supple. No thyromegaly present.  Cardiovascular: Normal rate, regular rhythm and normal heart sounds.   Pulmonary/Chest: Effort normal. No respiratory distress. She has no wheezes. She has no rales.  Neurological: She is alert and oriented to person, place, and time. No cranial nerve deficit.  Psychiatric: She has a normal mood and affect. Her behavior is normal.   BP 130/72  Pulse 91  Temp(Src) 98.5 F (36.9 C) (Oral)  Ht 4' 10.5" (1.486 m)  SpO2 98%     Lab Results  Component Value Date   WBC 4.5 07/13/2010   HGB 13.2 07/13/2010   HCT 38.5 07/13/2010   PLT 212.0 07/13/2010   CHOL 126 11/16/2010   TRIG 79.0 11/16/2010   HDL 44.90 11/16/2010   LDLDIRECT 193.0 07/13/2010   ALT 14 11/16/2010   AST 22 11/16/2010   NA 140 07/13/2010   K 4.4 07/13/2010   CL 103 07/13/2010   CREATININE 0.6 07/13/2010   BUN 19 07/13/2010   CO2 32 07/13/2010   TSH 2.06 07/13/2010   Assessment & Plan:  Cough - ? COPD exac - 04/2011 tx pred for wheeze and inflammation and empiric doxy - not improved - no regular use of inhalers - now 2nd recent flares - prev rx'd MDIs for same - ?  need to resume - check PFTs to define obst vs bronchospasm and use albuterol mdui prn pending pft results. Also check cxr, hold repeat abx

## 2011-06-08 NOTE — Patient Instructions (Signed)
It was good to see you today. Test(s) ordered today. Your results will be called to you after review (48-72hours after test completion). If any changes need to be made, you will be notified at that time. Start Ventolin inhaler as needed for cough spells - Your prescription(s) have been submitted to your pharmacy. Please take as directed and contact our office if you believe you are having problem(s) with the medication(s). Will check into Prolia status as discussed Please schedule followup in 3-4 months, call sooner if problems.

## 2011-06-08 NOTE — Assessment & Plan Note (Signed)
1st prolia done 12/08/11 - due for 6 mo repeat tx now - will ask staff to arrange

## 2011-06-08 NOTE — Assessment & Plan Note (Signed)
Last flare 04/2011 - hx bronchospasm and asthma - no significant improvement s/p pred/doxy 04/2011 check cxr and PFTs to determine best maintence med - LABA+steroid or spiriva Use ProAir prn - new erx done

## 2011-06-12 NOTE — Telephone Encounter (Signed)
Rec benefit summary from [employer]. Pt's OOP is 20% (approx $165-175). Prior Berkley Harvey is required. Faxed requested info to [phone]. Will wait for approval/denial from pt's ins.

## 2011-06-18 ENCOUNTER — Other Ambulatory Visit: Payer: Self-pay | Admitting: Internal Medicine

## 2011-06-22 ENCOUNTER — Ambulatory Visit (INDEPENDENT_AMBULATORY_CARE_PROVIDER_SITE_OTHER): Payer: Medicare Other | Admitting: Internal Medicine

## 2011-06-22 DIAGNOSIS — J449 Chronic obstructive pulmonary disease, unspecified: Secondary | ICD-10-CM

## 2011-06-22 LAB — PULMONARY FUNCTION TEST

## 2011-06-22 NOTE — Progress Notes (Signed)
PFT done today. 

## 2011-06-29 ENCOUNTER — Telehealth: Payer: Self-pay | Admitting: Internal Medicine

## 2011-06-29 NOTE — Telephone Encounter (Signed)
Notified pt with results.Marland KitchenMarland Kitchen6/29/12@10 :07am/LMB

## 2011-06-29 NOTE — Telephone Encounter (Signed)
Please call patient: PFT (breathing) tests show normal lung results - no asthma or COPD or emphysema. No treatment changes recommended, ok to continue albuterol MDI as needed. Patient to call if symptoms worse or unimproved - thanks.

## 2011-07-02 NOTE — Telephone Encounter (Signed)
Re- faxed requested OV notes, Dexa and a request to approve Prolia for pt since she started the injection in 11/2010. Will wait for insurance decision.

## 2011-07-12 NOTE — Telephone Encounter (Signed)
Rec another fax from [employer] requesting re-fax on below documentation. I faxed the same requested info for the 3rd time to [phone] and [phone]. Will wait to see if Medicare approves Prolia for pt.

## 2011-08-06 ENCOUNTER — Encounter: Payer: Self-pay | Admitting: Internal Medicine

## 2011-08-06 ENCOUNTER — Ambulatory Visit (INDEPENDENT_AMBULATORY_CARE_PROVIDER_SITE_OTHER)
Admission: RE | Admit: 2011-08-06 | Discharge: 2011-08-06 | Disposition: A | Discharge status: Home / Self Care | Payer: Medicare Other | Source: Ambulatory Visit | Attending: Internal Medicine | Admitting: Internal Medicine

## 2011-08-06 ENCOUNTER — Ambulatory Visit (INDEPENDENT_AMBULATORY_CARE_PROVIDER_SITE_OTHER): Payer: Medicare Other | Admitting: Internal Medicine

## 2011-08-06 VITALS — BP 120/72 | HR 87 | Temp 97.3°F | Ht 60.0 in

## 2011-08-06 DIAGNOSIS — IMO0002 Reserved for concepts with insufficient information to code with codable children: Secondary | ICD-10-CM

## 2011-08-06 DIAGNOSIS — M25531 Pain in right wrist: Secondary | ICD-10-CM

## 2011-08-06 DIAGNOSIS — M25539 Pain in unspecified wrist: Secondary | ICD-10-CM

## 2011-08-06 DIAGNOSIS — S80811A Abrasion, right lower leg, initial encounter: Secondary | ICD-10-CM

## 2011-08-06 DIAGNOSIS — M810 Age-related osteoporosis without current pathological fracture: Secondary | ICD-10-CM

## 2011-08-06 DIAGNOSIS — J449 Chronic obstructive pulmonary disease, unspecified: Secondary | ICD-10-CM

## 2011-08-06 MED ORDER — MUPIROCIN 2 % EX OINT: TOPICAL_OINTMENT | CUTANEOUS | Status: AC

## 2011-08-06 MED ORDER — ALBUTEROL SULFATE HFA 108 (90 BASE) MCG/ACT IN AERS: 2.0000 | INHALATION_SPRAY | Freq: Four times a day (QID) | RESPIRATORY_TRACT | Status: DC | PRN

## 2011-08-06 NOTE — Patient Instructions (Signed)
It was good to see you today. Xray of right wrist ordered today. Your results will be called to you after review (48-72hours after test completion). If any changes need to be made, you will be notified at that time. Wash or "soak" right shin 2-3x/day for next 7 days (warm soapy water), then cover with antibiotics ointment and gauze or bandaid  Your prescription(s) for antibiotics ointment have been submitted to your pharmacy. Please take as directed and contact our office if you believe you are having problem(s) with the medication(s). Will follow up on Prolia issues!!! Please keep scheduled followup as planned, call sooner if problems.

## 2011-08-06 NOTE — Assessment & Plan Note (Signed)
1st prolia done 12/08/11 - overdue for 6 mo repeat tx now -  will ask staff to investigate and arrange (insurance issues pending)

## 2011-08-06 NOTE — Progress Notes (Signed)
  Subjective:    Patient ID: Regina Ramsey, female    DOB: [birth year], 70 y.o.   MRN: [id number]  HPI  complains of injury - Precipitated by accidental fall down last 2 steps while descending stairs Landed onto tile floor with R hip and RLE underneath and R hand to brace fall No LOC or incontinence - spouse at home but fall unwitnessed Onset (occurrence) was 3 days ago Immediate onset of pain in anterior side of R lower leg, R wrist and L knee No bruising or swelling - no pop or "cracks" Progressively worse soreness but able to bear weight, walk  Past Medical History  Diagnosis Date  . MIGRAINE HEADACHE   . HEARING LOSS 07/13/2010  . PALPITATIONS, HX OF   . ALLERGIC RHINITIS   . ANEMIA-NOS   . HYPERTENSION   . HYPERLIPIDEMIA   . GERD   . OSTEOPOROSIS   . COPD     normal PFTs 06/22/11    Review of Systems  Respiratory: Negative for shortness of breath.   Cardiovascular: Negative for chest pain.  Musculoskeletal: Negative for back pain.  Neurological: Negative for syncope, light-headedness and headaches.       Objective:   Physical Exam BP 120/72  Pulse 87  Temp(Src) 97.3 F (36.3 C) (Oral)  Ht 5' (1.524 m)  SpO2 98%   Constitutional: She is oriented to person, place, and time. She appears well-developed and well-nourished. No distress.  Neck: Normal range of motion. Neck supple. No JVD present. No thyromegaly present.  Cardiovascular: Normal rate, regular rhythm and normal heart sounds.  No murmur heard. No BLE edema. Pulmonary/Chest: Effort normal and breath sounds normal. No respiratory distress. She has no wheezes.  Musculoskeletal: R wrist with mild tenderness to palp over radial side of wrist, FROM but painful active radial deviation. B knees and hips with normal range of motion, no joint effusions. No gross deformities Neurological: She is alert and oriented to person, place, and time. No cranial nerve deficit. Coordination normal.  Skin: small abrasion to  right anterior shin - tender to touch - no cellulitis Psychiatric: She has a normal mood and affect. Her behavior is normal. Judgment and thought content normal.       Assessment & Plan:  Fall, accidental - associated with mild R wrist pain - r/o fx with xray today  - also R leg pain due to abrasion, and L knee benign - no evidence for ortho injury in BLE -  Abrasion - educated on need to wask and soak 2-3x/d and cover with abx ointment and bandaid until healed - no systemic abx indicated at this time

## 2011-08-07 ENCOUNTER — Telehealth: Payer: Self-pay

## 2011-08-07 NOTE — Telephone Encounter (Signed)
PA received for pt's Proventil inhaler, [phone] ID [id number]. Okay to proceed or can Rx be changed to Ventolin?

## 2011-08-07 NOTE — Telephone Encounter (Signed)
Ok to change to ventolin , med list reviewed (albuterol MDI under both names so no "change" in rx needed) - however may send new rx  if needed - thanks

## 2011-08-08 NOTE — Telephone Encounter (Signed)
Per pharmacy Lowella Dandy) PA was not needed and pt has already pick up medication.

## 2011-08-09 ENCOUNTER — Telehealth: Payer: Self-pay

## 2011-08-09 MED ORDER — CEPHALEXIN 500 MG PO CAPS: 500.0000 mg | ORAL_CAPSULE | Freq: Three times a day (TID) | ORAL | Status: AC

## 2011-08-09 NOTE — Telephone Encounter (Signed)
Pt called stating that he leg is not improving and she was advised to call back for oral ABX. Pt is requesting Rx to pharmacy on file.

## 2011-08-09 NOTE — Telephone Encounter (Signed)
I spoke to Cecila at [facility] and she states that if we are buying and billing for Prolia- No PA is needed. I called pt to advise we can proceed with Prolia and her OOP is 20% approx $165. Pt is not at home now. Will try later.

## 2011-08-09 NOTE — Telephone Encounter (Signed)
Pt informed

## 2011-08-09 NOTE — Telephone Encounter (Signed)
keflex x 7d - erx done

## 2011-08-10 NOTE — Telephone Encounter (Signed)
Pt informed/transferred to scheduler to make nurse visit for middle or last of next week.

## 2011-08-15 ENCOUNTER — Ambulatory Visit (INDEPENDENT_AMBULATORY_CARE_PROVIDER_SITE_OTHER): Payer: Medicare Other | Admitting: *Deleted

## 2011-08-15 DIAGNOSIS — M810 Age-related osteoporosis without current pathological fracture: Secondary | ICD-10-CM

## 2011-08-15 MED ORDER — DENOSUMAB 60 MG/ML ~~LOC~~ SOLN
60.0000 mg | Freq: Once | SUBCUTANEOUS | Status: AC
  Administered 2011-08-15: 60 mg via SUBCUTANEOUS

## 2011-12-07 ENCOUNTER — Other Ambulatory Visit: Payer: Self-pay | Admitting: Internal Medicine

## 2012-01-03 ENCOUNTER — Other Ambulatory Visit: Payer: Self-pay | Admitting: Internal Medicine

## 2012-02-06 ENCOUNTER — Encounter: Payer: Medicare Other | Admitting: Internal Medicine

## 2012-02-07 ENCOUNTER — Ambulatory Visit (INDEPENDENT_AMBULATORY_CARE_PROVIDER_SITE_OTHER): Payer: Medicare Other | Admitting: Internal Medicine

## 2012-02-07 ENCOUNTER — Encounter: Payer: Self-pay | Admitting: Internal Medicine

## 2012-02-07 ENCOUNTER — Other Ambulatory Visit: Payer: Medicare Other

## 2012-02-07 ENCOUNTER — Other Ambulatory Visit (INDEPENDENT_AMBULATORY_CARE_PROVIDER_SITE_OTHER): Payer: Medicare Other

## 2012-02-07 VITALS — BP 126/68 | HR 85 | Temp 97.6°F | Ht 58.5 in | Wt 117.4 lb

## 2012-02-07 DIAGNOSIS — Z79899 Other long term (current) drug therapy: Secondary | ICD-10-CM

## 2012-02-07 DIAGNOSIS — M810 Age-related osteoporosis without current pathological fracture: Secondary | ICD-10-CM

## 2012-02-07 DIAGNOSIS — E785 Hyperlipidemia, unspecified: Secondary | ICD-10-CM

## 2012-02-07 DIAGNOSIS — Z131 Encounter for screening for diabetes mellitus: Secondary | ICD-10-CM

## 2012-02-07 DIAGNOSIS — Z23 Encounter for immunization: Secondary | ICD-10-CM

## 2012-02-07 DIAGNOSIS — I10 Essential (primary) hypertension: Secondary | ICD-10-CM

## 2012-02-07 DIAGNOSIS — Z0000 Encounter for general adult medical examination without abnormal findings: Secondary | ICD-10-CM

## 2012-02-07 LAB — HEPATIC FUNCTION PANEL
ALT: 13 U/L (ref 0–35)
Albumin: 4.4 g/dL (ref 3.5–5.2)
Alkaline Phosphatase: 41 U/L (ref 39–117)
Bilirubin, Direct: 0.1 mg/dL (ref 0.0–0.3)
Total Protein: 7.2 g/dL (ref 6.0–8.3)

## 2012-02-07 LAB — LIPID PANEL
Cholesterol: 149 mg/dL (ref 0–200)
HDL: 52.9 mg/dL (ref >39.00)
LDL Cholesterol: 77 mg/dL (ref 0–99)
Total CHOL/HDL Ratio: 3
Triglycerides: 96 mg/dL (ref 0.0–149.0)

## 2012-02-07 NOTE — Progress Notes (Signed)
Subjective:    Patient ID: Regina Ramsey, female    DOB: 1941-02-04, 71 y.o.   MRN: [id number]  HPI   Here for medicare wellness  Diet: heart healthy Physical activity: active, indep Depression/mood screen: negative Hearing: intact to whispered voice Visual acuity: grossly normal, performs annual eye exam  ADLs: capable Fall risk: none Home safety: good Cognitive evaluation: intact to orientation, naming, recall and repetition EOL planning: adv directives, full code/ I agree  I have personally reviewed and have noted 1. The patient's medical and social history 2. Their use of alcohol, tobacco or illicit drugs 3. Their current medications and supplements 4. The patient's functional ability including ADL's, fall risks, home safety risks and hearing or visual impairment. 5. Diet and physical activities 6. Evidence for depression or mood disorders   Also reviewed chronic medical issues:  HTN - resumed lisinopril hct 06/2010 - reports compliance with ongoing medical treatment and no changes in medication dose or frequency. denies adverse side effects related to current therapy. no headache, chest pain or edema - no known CAD or CVA hx  dyslipidemia - on crestor 5mg  - ran out 11/2009 and would prefer not to take, but resumed 06/2010 due to FLP results - reports compliance with ongoing medical treatment and no changes in medication dose or frequency. denies adverse side effects related to current therapy.  no myalgia or GI problems  osteoporosis - results dexa 07/2010 progression compared to 03/2008 (-3.1, unchanged) - reluctant to take fosamax generic + Ca/D  - prev IV reclast x 1 dose >SE bone and muscle aches  - started prolia 11/2010, due every 6 mo (now)  Past Medical History  Diagnosis Date  . MIGRAINE HEADACHE   . HEARING LOSS 07/13/2010  . PALPITATIONS, HX OF   . ALLERGIC RHINITIS   . ANEMIA-NOS   . HYPERTENSION   . HYPERLIPIDEMIA   . GERD   . OSTEOPOROSIS   . COPD     normal PFTs 06/22/11   Family History  Problem Relation Age of Onset  . Cancer Mother     met cancer  . Arthritis Mother   . Breast cancer Mother   . Arthritis Father   . Breast cancer Other   . Diabetes Other     parent & grandparent  . Hyperlipidemia Other     grandparent   History  Substance Use Topics  . Smoking status: Former Smoker    Quit date: 12/31/1988  . Smokeless tobacco: Not on file   Comment: Married, lives with souse. 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 Alcohol Use: Yes     Social     Review of Systems Constitutional: Negative for fever or weight change.  Respiratory: Negative for cough and shortness of breath.   Cardiovascular: Negative for chest pain or palpitations.  Gastrointestinal: Negative for abdominal pain, no bowel changes.  Musculoskeletal: Negative for gait problem or joint swelling.  Skin: Negative for rash.  Neurological: Negative for dizziness or headache.  No other specific complaints in a complete review of systems (except as listed in HPI above).     Objective:   Physical Exam BP 126/68  Pulse 85  Temp(Src) 97.6 F (36.4 C) (Oral)  Ht 4' 10.5" (1.486 m)  Wt 117 lb 6.4 oz (53.252 kg)  BMI 24.12 kg/m2  SpO2 99%  Wt Readings from Last 3 Encounters:  02/07/12 117 lb 6.4 oz (53.252 kg)  11/16/10 112 lb (50.803 kg)  08/24/10 112 lb (50.803  kg)   Constitutional: She appears well-developed and well-nourished. No distress.  HENT: Head: Normocephalic and atraumatic. Ears: B TMs ok, no erythema or effusion; Nose: Nose normal.  Mouth/Throat: Oropharynx is clear and moist. No oropharyngeal exudate.  Eyes: Conjunctivae and EOM are normal. Pupils are equal, round, and reactive to light. No scleral icterus.  Neck: Normal range of motion. Neck supple. No JVD present. No thyromegaly present.  Cardiovascular: Normal rate, regular rhythm and normal heart sounds.  No murmur heard. No BLE edema. Pulmonary/Chest: Effort normal  and breath sounds normal. No respiratory distress. She has no wheezes.  Abdominal: Soft. Bowel sounds are normal. She exhibits no distension. There is no tenderness. no masses Musculoskeletal: Normal range of motion, no joint effusions. No gross deformities Neurological: She is alert and oriented to person, place, and time. No cranial nerve deficit. Coordination normal.  Skin: Skin is warm and dry. No rash noted. No erythema.  Psychiatric: She has a normal mood and affect. Her behavior is normal. Judgment and thought content normal.       Lab Results  Component Value Date   WBC 4.5 07/13/2010   HGB 13.2 07/13/2010   HCT 38.5 07/13/2010   PLT 212.0 07/13/2010   CHOL 126 11/16/2010   TRIG 79.0 11/16/2010   HDL 44.90 11/16/2010   LDLDIRECT 193.0 07/13/2010   ALT 14 11/16/2010   AST 22 11/16/2010   NA 140 07/13/2010   K 4.4 07/13/2010   CL 103 07/13/2010   CREATININE 0.6 07/13/2010   BUN 19 07/13/2010   CO2 32 07/13/2010   TSH 2.06 07/13/2010    ZOX:WRUEA @ 84 bpm, incomp LBBB  Assessment & Plan:   AWV/v70.0 - Today patient counseled on age appropriate routine health concerns for screening and prevention, each reviewed and up to date or declined. Immunizations reviewed and up to date or declined. Labs/ECG reviewed. Risk factors for depression reviewed and negative. Hearing function and visual acuity are intact. ADLs screened and addressed as needed. Functional ability and level of safety reviewed and appropriate. Education, counseling and referrals performed based on assessed risks today. Patient provided with a copy of personalized plan for preventive services.

## 2012-02-07 NOTE — Assessment & Plan Note (Signed)
DEXA 07/2010 - will order follow up now On prolia since 11/2010 - due for 3rd shot now - will ask staff to verify coverage and administer as needed

## 2012-02-07 NOTE — Assessment & Plan Note (Signed)
On statin for same - Check lipids and LFTs annually  

## 2012-02-07 NOTE — Patient Instructions (Signed)
It was good to see you today. Health Maintenance reviewed - Pneumovax given, will check on Prolia shot timing and given when ready. Will "think about" Singles vaccine (Zostavax) as discussed, everything else up to date we'll make referral for bone density at [facility] (DEXA). Our office will contact you regarding appointment(s) once made. Test(s) ordered today. Your results will be called to you after review (48-72hours after test completion). If any changes need to be made, you will be notified at that time. Medications reviewed, no changes at this time. Please schedule followup in 6-12 months for blood pressure and Prolia, call sooner if problems.

## 2012-02-07 NOTE — Assessment & Plan Note (Signed)
The current medical regimen is effective;  continue present plan and medications. BP Readings from Last 3 Encounters:  02/07/12 126/68  08/06/11 120/72  06/08/11 130/72

## 2012-02-29 ENCOUNTER — Ambulatory Visit (INDEPENDENT_AMBULATORY_CARE_PROVIDER_SITE_OTHER): Payer: Medicare Other | Admitting: *Deleted

## 2012-02-29 DIAGNOSIS — M810 Age-related osteoporosis without current pathological fracture: Secondary | ICD-10-CM

## 2012-02-29 MED ORDER — DENOSUMAB 60 MG/ML ~~LOC~~ SOLN
60.0000 mg | Freq: Once | SUBCUTANEOUS | Status: AC
  Administered 2012-02-29: 60 mg via SUBCUTANEOUS

## 2012-04-11 ENCOUNTER — Ambulatory Visit (INDEPENDENT_AMBULATORY_CARE_PROVIDER_SITE_OTHER)
Admission: RE | Admit: 2012-04-11 | Discharge: 2012-04-11 | Disposition: A | Discharge status: Home / Self Care | Payer: Medicare Other | Source: Ambulatory Visit | Attending: Internal Medicine | Admitting: Internal Medicine

## 2012-04-11 DIAGNOSIS — M810 Age-related osteoporosis without current pathological fracture: Secondary | ICD-10-CM

## 2012-07-23 ENCOUNTER — Other Ambulatory Visit: Payer: Self-pay | Admitting: *Deleted

## 2012-07-23 MED ORDER — ROSUVASTATIN CALCIUM 5 MG PO TABS: 5.0000 mg | ORAL_TABLET | Freq: Every day | ORAL | Status: DC

## 2012-07-23 MED ORDER — LISINOPRIL-HYDROCHLOROTHIAZIDE 10-12.5 MG PO TABS: 1.0000 | ORAL_TABLET | Freq: Every day | ORAL | Status: DC

## 2012-07-25 ENCOUNTER — Other Ambulatory Visit: Payer: Self-pay

## 2012-07-25 NOTE — Telephone Encounter (Signed)
A user error has taken place: encounter opened in error, closed for administrative reasons.

## 2012-08-07 ENCOUNTER — Ambulatory Visit: Payer: Medicare Other | Admitting: Internal Medicine

## 2012-08-07 DIAGNOSIS — Z0289 Encounter for other administrative examinations: Secondary | ICD-10-CM

## 2012-09-09 ENCOUNTER — Ambulatory Visit (INDEPENDENT_AMBULATORY_CARE_PROVIDER_SITE_OTHER): Payer: Medicare Other | Admitting: Internal Medicine

## 2012-09-09 ENCOUNTER — Encounter: Payer: Self-pay | Admitting: Internal Medicine

## 2012-09-09 ENCOUNTER — Telehealth: Payer: Self-pay | Admitting: *Deleted

## 2012-09-09 VITALS — BP 122/70 | HR 82 | Temp 97.7°F | Ht 58.5 in | Wt 117.4 lb

## 2012-09-09 DIAGNOSIS — L309 Dermatitis, unspecified: Secondary | ICD-10-CM

## 2012-09-09 DIAGNOSIS — L259 Unspecified contact dermatitis, unspecified cause: Secondary | ICD-10-CM

## 2012-09-09 DIAGNOSIS — M542 Cervicalgia: Secondary | ICD-10-CM

## 2012-09-09 DIAGNOSIS — M479 Spondylosis, unspecified: Secondary | ICD-10-CM

## 2012-09-09 DIAGNOSIS — M810 Age-related osteoporosis without current pathological fracture: Secondary | ICD-10-CM

## 2012-09-09 MED ORDER — TRIAMCINOLONE ACETONIDE 0.1 % EX CREA: TOPICAL_CREAM | Freq: Two times a day (BID) | CUTANEOUS | Status: DC

## 2012-09-09 MED ORDER — CYCLOBENZAPRINE HCL 5 MG PO TABS: 5.0000 mg | ORAL_TABLET | Freq: Three times a day (TID) | ORAL | Status: AC | PRN

## 2012-09-09 MED ORDER — TRAMADOL HCL 50 MG PO TABS: 50.0000 mg | ORAL_TABLET | Freq: Three times a day (TID) | ORAL | Status: DC | PRN

## 2012-09-09 NOTE — Telephone Encounter (Signed)
Received PA back med has been approve. Notified pharmacy spoke with [facility] approval status.Marland KitchenRaechel Chute

## 2012-09-09 NOTE — Assessment & Plan Note (Signed)
DEXA 07/2010 - will order follow up now On prolia since 11/2010 - q46mo will ask staff to verify coverage and administer as needed

## 2012-09-09 NOTE — Patient Instructions (Addendum)
It was good to see you today. will check on Prolia shot timing and given when ready Use tramadol for back pain as needed, flexeril at night for neck spasm pain as needed and triamcinolone cream for itch as needed Your prescription(s) have been submitted to your pharmacy. Please take as directed and contact our office if you believe you are having problem(s) with the medication(s).

## 2012-09-09 NOTE — Progress Notes (Signed)
Subjective:    Patient ID: Regina Ramsey, female    DOB: [birth year], 71 y.o.   MRN: [id number]  Back Pain This is a chronic problem. The current episode started in the past 7 days. The problem occurs intermittently. The problem has been waxing and waning since onset. The pain is present in the sacro-iliac. The quality of the pain is described as aching. The pain does not radiate. The pain is moderate. The pain is the same all the time. The symptoms are aggravated by position and sitting. Stiffness is present all day. Pertinent negatives include no abdominal pain, dysuria, fever, headaches, leg pain, numbness, pelvic pain, tingling, weakness or weight loss. Risk factors include history of osteoporosis. She has tried NSAIDs, walking and analgesics for the symptoms. The treatment provided moderate relief.   Also reviewed chronic medical issues:  HTN - resumed lisinopril hct 06/2010 - reports compliance with ongoing medical treatment and no changes in medication dose or frequency. denies adverse side effects related to current therapy. no headache, chest pain or edema - no known CAD or CVA hx  dyslipidemia - on crestor 5mg  since 06/2010- reports compliance with ongoing medical treatment and no changes in medication dose or frequency. denies adverse side effects related to current therapy.  no myalgia or GI problems  osteoporosis - results dexa 07/2010 progression compared to 03/2008 (-3.1, unchanged) - reluctant to take fosamax generic + Ca/D  - prev IV reclast x 1 dose >SE bone and muscle aches  - started prolia 11/2010, due every 6 mo (now)  Past Medical History  Diagnosis Date  . MIGRAINE HEADACHE   . HEARING LOSS 07/13/2010  . PALPITATIONS, HX OF   . ALLERGIC RHINITIS   . ANEMIA-NOS   . HYPERTENSION   . HYPERLIPIDEMIA   . GERD   . OSTEOPOROSIS   . COPD     normal PFTs 06/22/11     Review of Systems  Constitutional: Negative for fever and weight loss.  Gastrointestinal: Negative for  abdominal pain.  Genitourinary: Negative for dysuria and pelvic pain.  Musculoskeletal: Positive for back pain.  Neurological: Negative for tingling, weakness, numbness and headaches.       Objective:   Physical Exam  BP 122/70  Pulse 82  Temp 97.7 F (36.5 C) (Oral)  Ht 4' 10.5" (1.486 m)  Wt 117 lb 6.4 oz (53.252 kg)  BMI 24.12 kg/m2  SpO2 98%  Wt Readings from Last 3 Encounters:  09/09/12 117 lb 6.4 oz (53.252 kg)  02/07/12 117 lb 6.4 oz (53.252 kg)  11/16/10 112 lb (50.803 kg)   Constitutional: She appears well-developed and well-nourished. No distress.  Neck: Normal range of motion. Neck supple. No JVD present. No thyromegaly present.  Cardiovascular: Normal rate, regular rhythm and normal heart sounds.  No murmur heard. No BLE edema. Pulmonary/Chest: Effort normal and breath sounds normal. No respiratory distress. She has no wheezes.  Musculoskeletal:Back: full range of motion of thoracic and lumbar spine. Non tender to palpation. Negative straight leg raise. DTR's are symmetrically intact. Sensation intact in all dermatomes of the lower extremities. Full strength to manual muscle testing. patient is able to heel toe walk without difficulty and ambulates with antalgic gait. Skin: Eczema-like dermatitis on bilateral lower extremities. Remaining skin is warm and dry. No rash noted. No erythema.  Psychiatric: She has a normal mood and affect. Her behavior is normal. Judgment and thought content normal.       Lab Results  Component Value Date  WBC 4.5 07/13/2010   HGB 13.2 07/13/2010   HCT 38.5 07/13/2010   PLT 212.0 07/13/2010   CHOL 149 02/07/2012   TRIG 96.0 02/07/2012   HDL 52.90 02/07/2012   LDLDIRECT 193.0 07/13/2010   ALT 13 02/07/2012   AST 21 02/07/2012   NA 140 07/13/2010   K 4.4 07/13/2010   CL 103 07/13/2010   CREATININE 0.6 07/13/2010   BUN 19 07/13/2010   CO2 32 07/13/2010   TSH 2.06 07/13/2010      Assessment & Plan:   Chronic lumbar pain with right-sided  dominant symptoms - no new deficits on exam. No symptoms currently but flare of pain symptoms 48 hours ago. We'll prescribe tramadol to use when over-the-counter anti-inflammatory ineffective. Also muscle relaxants for back and neck spasm as needed at night - patient to call if symptoms worse or unimproved with this therapy  Eczema. Topical steroid electronic prescription done. Followup with dermatology if unimproved (taffeen)

## 2012-09-09 NOTE — Telephone Encounter (Signed)
Received fax need PA on cyclobenzaprine. Contacted insurance faxing over PA did received & completed. Faxed back to insurance waiting on PA status...Raechel Chute

## 2012-09-17 ENCOUNTER — Ambulatory Visit (INDEPENDENT_AMBULATORY_CARE_PROVIDER_SITE_OTHER): Payer: Medicare Other | Admitting: General Practice

## 2012-09-17 ENCOUNTER — Ambulatory Visit: Payer: Medicare Other

## 2012-09-17 DIAGNOSIS — M810 Age-related osteoporosis without current pathological fracture: Secondary | ICD-10-CM

## 2012-09-17 MED ORDER — DENOSUMAB 60 MG/ML ~~LOC~~ SOLN
60.0000 mg | Freq: Once | SUBCUTANEOUS | Status: AC
  Administered 2012-09-17: 60 mg via SUBCUTANEOUS

## 2012-09-19 ENCOUNTER — Emergency Department (HOSPITAL_COMMUNITY): Payer: Medicare Other

## 2012-09-19 ENCOUNTER — Emergency Department (HOSPITAL_COMMUNITY)
Admission: EM | Admit: 2012-09-19 | Discharge: 2012-09-19 | Disposition: A | Discharge status: Home / Self Care | Payer: Medicare Other | Attending: Emergency Medicine | Admitting: Emergency Medicine

## 2012-09-19 ENCOUNTER — Encounter (HOSPITAL_COMMUNITY): Payer: Self-pay | Admitting: *Deleted

## 2012-09-19 DIAGNOSIS — J449 Chronic obstructive pulmonary disease, unspecified: Secondary | ICD-10-CM | POA: Insufficient documentation

## 2012-09-19 DIAGNOSIS — M25519 Pain in unspecified shoulder: Secondary | ICD-10-CM | POA: Insufficient documentation

## 2012-09-19 DIAGNOSIS — M810 Age-related osteoporosis without current pathological fracture: Secondary | ICD-10-CM | POA: Insufficient documentation

## 2012-09-19 DIAGNOSIS — Z87891 Personal history of nicotine dependence: Secondary | ICD-10-CM | POA: Insufficient documentation

## 2012-09-19 DIAGNOSIS — J4489 Other specified chronic obstructive pulmonary disease: Secondary | ICD-10-CM | POA: Insufficient documentation

## 2012-09-19 DIAGNOSIS — E785 Hyperlipidemia, unspecified: Secondary | ICD-10-CM | POA: Insufficient documentation

## 2012-09-19 DIAGNOSIS — K219 Gastro-esophageal reflux disease without esophagitis: Secondary | ICD-10-CM | POA: Insufficient documentation

## 2012-09-19 DIAGNOSIS — R079 Chest pain, unspecified: Secondary | ICD-10-CM | POA: Insufficient documentation

## 2012-09-19 DIAGNOSIS — Z79899 Other long term (current) drug therapy: Secondary | ICD-10-CM | POA: Insufficient documentation

## 2012-09-19 DIAGNOSIS — I10 Essential (primary) hypertension: Secondary | ICD-10-CM | POA: Insufficient documentation

## 2012-09-19 DIAGNOSIS — G8929 Other chronic pain: Secondary | ICD-10-CM | POA: Insufficient documentation

## 2012-09-19 DIAGNOSIS — M549 Dorsalgia, unspecified: Secondary | ICD-10-CM

## 2012-09-19 DIAGNOSIS — Z85830 Personal history of malignant neoplasm of bone: Secondary | ICD-10-CM | POA: Insufficient documentation

## 2012-09-19 MED ORDER — IBUPROFEN 200 MG PO TABS
600.0000 mg | ORAL_TABLET | Freq: Once | ORAL | Status: AC
  Administered 2012-09-19: 600 mg via ORAL
  Filled 2012-09-19: qty 3

## 2012-09-19 MED ORDER — IBUPROFEN 600 MG PO TABS: 600.0000 mg | ORAL_TABLET | Freq: Three times a day (TID) | ORAL | Status: AC

## 2012-09-19 MED ORDER — TRAMADOL HCL 50 MG PO TABS: 50.0000 mg | ORAL_TABLET | Freq: Three times a day (TID) | ORAL | Status: DC | PRN

## 2012-09-19 MED ORDER — TRAMADOL HCL 50 MG PO TABS
25.0000 mg | ORAL_TABLET | Freq: Once | ORAL | Status: AC
  Administered 2012-09-19: 25 mg via ORAL
  Filled 2012-09-19: qty 1

## 2012-09-19 NOTE — ED Notes (Addendum)
Per EMS. Pt is from home. Pt reports twisting to left and felt shooting pain to right scapula. Pt denies popping or other issues. Pt has extensive history of osteoporosis and bone cancer. No deformity and no pain with palpation,. Pt reports pain is worse with movement. Pt denies pain when not moving

## 2012-09-19 NOTE — ED Provider Notes (Signed)
History     CSN: [id number]  Arrival date & time 09/19/12  [id number]   First MD Initiated Contact with Patient 09/19/12 [phone]      Chief Complaint  Patient presents with  . Back Pain    Right Scapular Pain     HPI Patient presents with the acute onset of right-sided back pain.  She notes that she has chronic back pain, but this is reasonably well-controlled with tramadol.  Today, just prior to arrival the patient made an awkward twisting and lifting motion and felt a sharp onset of pain from her right scapula to her right sacrum.  The pain is sharp, worse with motion, and causes pain with inspiration.  No dish no medication was taken after the onset of pain.  She denies any fall, trauma, dyspnea, other chest pain, other focal complaints. Past Medical History  Diagnosis Date  . MIGRAINE HEADACHE   . HEARING LOSS 07/13/2010  . PALPITATIONS, HX OF   . ALLERGIC RHINITIS   . ANEMIA-NOS   . HYPERTENSION   . HYPERLIPIDEMIA   . GERD   . OSTEOPOROSIS   . COPD     normal PFTs 06/22/11  . Bone cancer     Past Surgical History  Procedure Date  . Breast surgery 1985    breast biopsy  . Appendectomy 1958  . Abdominal hysterectomy 1974    Family History  Problem Relation Age of Onset  . Cancer Mother     met cancer  . Arthritis Mother   . Breast cancer Mother   . Arthritis Father   . Breast cancer Other   . Diabetes Other     parent & grandparent  . Hyperlipidemia Other     grandparent    History  Substance Use Topics  . Smoking status: Former Smoker    Quit date: 12/31/1988  . Smokeless tobacco: Not on file   Comment: Married, lives with souse. 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 Alcohol Use: Yes     Social    OB History    Grav Para Term Preterm Abortions TAB SAB Ect Mult Living                  Review of Systems  Constitutional:       HPI  HENT:       HPI otherwise negative  Eyes: Negative.   Respiratory:       HPI, otherwise negative    Cardiovascular:       HPI, otherwise nmegative  Gastrointestinal: Negative for nausea and vomiting.  Genitourinary:       HPI, otherwise negative  Musculoskeletal: Positive for back pain.       Osteoporosis, chronic pain in back (diffuse)  Skin: Negative.   Neurological: Negative for syncope.    Allergies  Review of patient's allergies indicates no known allergies.  Home Medications   Current Outpatient Rx  Name Route Sig Dispense Refill  . DENOSUMAB 60 MG/ML [employer] SOLN Subcutaneous Inject 60 mg into the skin once. Every six months     . LISINOPRIL-HYDROCHLOROTHIAZIDE 10-12.5 MG PO TABS Oral Take 1 tablet by mouth daily. 30 tablet 5  . ADULT MULTIVITAMIN W/MINERALS CH Oral Take 1 tablet by mouth daily.    Marland Kitchen ROSUVASTATIN CALCIUM 5 MG PO TABS Oral Take 1 tablet (5 mg total) by mouth at bedtime. 30 tablet 5  . TRAMADOL HCL 50 MG PO TABS Oral Take 50 mg by mouth every 8 (eight) hours  as needed. Patient cuts into thirds    . TRIAMCINOLONE ACETONIDE 0.1 % EX CREA Topical Apply 1 application topically 2 (two) times daily as needed. For itching    . ALBUTEROL SULFATE HFA 108 (90 BASE) MCG/ACT IN AERS Inhalation Inhale 2 puffs into the lungs every 6 (six) hours as needed. For shortness of breath    . CALCIUM CARBONATE-VITAMIN D 600-400 MG-UNIT PO TABS Oral Take 1 tablet by mouth 2 (two) times daily.      . CYCLOBENZAPRINE HCL 5 MG PO TABS Oral Take 1 tablet (5 mg total) by mouth every 8 (eight) hours as needed for muscle spasms. 30 tablet 1    BP 133/52  Pulse 81  Temp 97.4 F (36.3 C) (Oral)  Resp 18  SpO2 95%  Physical Exam  Nursing note and vitals reviewed. Constitutional: She is oriented to person, place, and time. She appears well-developed and well-nourished. No distress.  HENT:  Head: Normocephalic and atraumatic.  Eyes: Conjunctivae normal and EOM are normal.  Cardiovascular: Normal rate and regular rhythm.   Pulmonary/Chest: Effort normal and breath sounds normal. No  stridor. No respiratory distress.  Abdominal: She exhibits no distension.  Musculoskeletal: She exhibits no edema.  Neurological: She is alert and oriented to person, place, and time. No cranial nerve deficit.  Skin: Skin is warm and dry.  Psychiatric: She has a normal mood and affect.    ED Course  Procedures (including critical care time)  Labs Reviewed - No data to display No results found.   No diagnosis found.    MDM  This generally well F, but w Hx of osteoporosis and chronic back pain now p/w the acute onset of R-sided back pain exacerbated by deep breaths.  Given her description there is concern for intercostal / paraspinal muscle strain vs. Rib fracture.  XR were reassuring, and the patient had some improvement w analgesics.  Absent concerning VS / findings / distress, she was d/c in stable condition.  Gerhard Munch, MD 09/19/12 [phone]

## 2012-09-19 NOTE — ED Notes (Signed)
EAV:[id number]<JX> Expected date:09/19/12<BR> Expected time: 9:32 AM<BR> Means of arrival:Ambulance<BR> Comments:<BR> 71yoF, R side pain

## 2013-01-05 ENCOUNTER — Other Ambulatory Visit: Payer: Self-pay | Admitting: Internal Medicine

## 2013-02-14 ENCOUNTER — Other Ambulatory Visit: Payer: Self-pay

## 2013-03-11 ENCOUNTER — Other Ambulatory Visit: Payer: Self-pay | Admitting: *Deleted

## 2013-03-11 MED ORDER — LISINOPRIL-HYDROCHLOROTHIAZIDE 10-12.5 MG PO TABS: ORAL_TABLET | ORAL | Status: DC

## 2013-04-01 ENCOUNTER — Other Ambulatory Visit (INDEPENDENT_AMBULATORY_CARE_PROVIDER_SITE_OTHER): Payer: Medicare Other

## 2013-04-01 ENCOUNTER — Encounter: Payer: Self-pay | Admitting: Internal Medicine

## 2013-04-01 ENCOUNTER — Ambulatory Visit (INDEPENDENT_AMBULATORY_CARE_PROVIDER_SITE_OTHER): Payer: Medicare Other | Admitting: Internal Medicine

## 2013-04-01 VITALS — BP 118/70 | HR 97 | Temp 98.0°F | Resp 16 | Wt 117.2 lb

## 2013-04-01 DIAGNOSIS — R5381 Other malaise: Secondary | ICD-10-CM

## 2013-04-01 DIAGNOSIS — Z1239 Encounter for other screening for malignant neoplasm of breast: Secondary | ICD-10-CM

## 2013-04-01 DIAGNOSIS — M810 Age-related osteoporosis without current pathological fracture: Secondary | ICD-10-CM

## 2013-04-01 DIAGNOSIS — I10 Essential (primary) hypertension: Secondary | ICD-10-CM

## 2013-04-01 DIAGNOSIS — J309 Allergic rhinitis, unspecified: Secondary | ICD-10-CM

## 2013-04-01 DIAGNOSIS — E785 Hyperlipidemia, unspecified: Secondary | ICD-10-CM

## 2013-04-01 DIAGNOSIS — Z0000 Encounter for general adult medical examination without abnormal findings: Secondary | ICD-10-CM

## 2013-04-01 LAB — BASIC METABOLIC PANEL
CO2: 33 mEq/L — ABNORMAL HIGH (ref 19–32)
Chloride: 101 mEq/L (ref 96–112)
Creatinine, Ser: 0.8 mg/dL (ref 0.4–1.2)
Glucose, Bld: 100 mg/dL — ABNORMAL HIGH (ref 70–99)

## 2013-04-01 LAB — TSH: TSH: 2.64 u[IU]/mL (ref 0.35–5.50)

## 2013-04-01 LAB — HEPATIC FUNCTION PANEL
AST: 20 U/L (ref 0–37)
Albumin: 4.1 g/dL (ref 3.5–5.2)
Alkaline Phosphatase: 44 U/L (ref 39–117)
Bilirubin, Direct: 0.2 mg/dL (ref 0.0–0.3)
Total Bilirubin: 0.7 mg/dL (ref 0.3–1.2)

## 2013-04-01 LAB — CBC WITH DIFFERENTIAL/PLATELET
Basophils Absolute: 0 10*3/uL (ref 0.0–0.1)
Basophils Relative: 0.5 % (ref 0.0–3.0)
Eosinophils Absolute: 0.1 10*3/uL (ref 0.0–0.7)
MCHC: 32.9 g/dL (ref 30.0–36.0)
MCV: 97.7 fl (ref 78.0–100.0)
Monocytes Absolute: 0.7 10*3/uL (ref 0.1–1.0)
Neutro Abs: 4.4 10*3/uL (ref 1.4–7.7)
Neutrophils Relative %: 64.9 % (ref 43.0–77.0)
RBC: 4.25 Mil/uL (ref 3.87–5.11)
RDW: 12.8 % (ref 11.5–14.6)

## 2013-04-01 LAB — LIPID PANEL: Total CHOL/HDL Ratio: 3

## 2013-04-01 MED ORDER — ROSUVASTATIN CALCIUM 5 MG PO TABS: 5.0000 mg | ORAL_TABLET | Freq: Every day | ORAL | Status: DC

## 2013-04-01 MED ORDER — TRAMADOL HCL 50 MG PO TABS: 50.0000 mg | ORAL_TABLET | Freq: Three times a day (TID) | ORAL | Status: AC | PRN

## 2013-04-01 MED ORDER — LISINOPRIL-HYDROCHLOROTHIAZIDE 10-12.5 MG PO TABS: 1.0000 | ORAL_TABLET | Freq: Every day | ORAL | Status: DC

## 2013-04-01 MED ORDER — CETIRIZINE HCL 10 MG PO TABS: 10.0000 mg | ORAL_TABLET | Freq: Every day | ORAL | Status: DC

## 2013-04-01 MED ORDER — METHYLPREDNISOLONE ACETATE 40 MG/ML IJ SUSP
80.0000 mg | Freq: Once | INTRAMUSCULAR | Status: AC
  Administered 2013-04-01: 80 mg via INTRAMUSCULAR

## 2013-04-01 NOTE — Assessment & Plan Note (Signed)
occasional seasonal/spring flares, allergic hx bronchospasm and asthma -  04/2011 PFTs normal - Continues to use ProAir prn - ok to refill as needed

## 2013-04-01 NOTE — Assessment & Plan Note (Signed)
DEXA 03/2012 reviewed On prolia since 11/2010 - q61mo will ask staff to verify coverage and administer as needed

## 2013-04-01 NOTE — Progress Notes (Signed)
Subjective:    Patient ID: Regina Ramsey, female    DOB: 26-Apr-1941, 72 y.o.   MRN: [id number]  HPI Here for medicare wellness  Diet: heart healthy Physical activity: active, indep Depression/mood screen: negative Hearing: intact to whispered voice Visual acuity: grossly normal, performs annual eye exam  ADLs: capable Fall risk: none Home safety: good Cognitive evaluation: intact to orientation, naming, recall and repetition EOL planning: adv directives, full code/ I agree  I have personally reviewed and have noted 1. The patient's medical and social history 2. Their use of alcohol, tobacco or illicit drugs 3. Their current medications and supplements 4. The patient's functional ability including ADL's, fall risks, home safety risks and hearing or visual impairment. 5. Diet and physical activities 6. Evidence for depression or mood disorders   Also reviewed chronic medical issues:  HTN - resumed lisinopril hct 06/2010 - reports compliance with ongoing medical treatment and no changes in medication dose or frequency. denies adverse side effects related to current therapy. no headache, chest pain or edema - no known CAD or CVA hx  dyslipidemia - on crestor 5mg  - ran out 11/2009 and would prefer not to take, but resumed 06/2010 due to FLP results - reports compliance with ongoing medical treatment and no changes in medication dose or frequency. denies adverse side effects related to current therapy.    osteoporosis - DEXA 02/2012 reviewed - reluctant to take fosamax generic + Ca/D  - prev IV reclast x 1 dose in 2010 -caused side effects of bone and muscle aches - started prolia 11/2010, due every 6 mo (now)  Past Medical History  Diagnosis Date  . MIGRAINE HEADACHE   . HEARING LOSS 07/13/2010  . PALPITATIONS, HX OF   . ALLERGIC RHINITIS   . ANEMIA-NOS   . HYPERTENSION   . HYPERLIPIDEMIA   . GERD   . OSTEOPOROSIS     on Prolia q 22mo, hx vertebral fx  . COPD     normal PFTs  06/22/11   Family History  Problem Relation Age of Onset  . Cancer Mother     met cancer  . Arthritis Mother   . Breast cancer Mother   . Arthritis Father   . Breast cancer Other   . Diabetes Other     parent & grandparent  . Hyperlipidemia Other     grandparent   History  Substance Use Topics  . Smoking status: Former Smoker    Quit date: 12/31/1988  . Smokeless tobacco: Not on file     Comment: Married, lives with souse. 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 Alcohol Use: Yes     Comment: Social     Review of Systems Constitutional: Negative for fever or weight change.  Respiratory: Negative for shortness of breath.  cough (dry) x 1 week Cardiovascular: Negative for chest pain or palpitations.  Gastrointestinal: Negative for abdominal pain, no bowel changes.  Musculoskeletal: Negative for gait problem or joint swelling.  Skin: Negative for rash.  Neurological: Negative for dizziness or headache.  No other specific complaints in a complete review of systems (except as listed in HPI above).     Objective:   Physical Exam  BP 118/70  Pulse 97  Temp(Src) 98 F (36.7 C) (Oral)  Resp 16  Wt 117 lb 4 oz (53.184 kg)  BMI 24.08 kg/m2  SpO2 97%  Wt Readings from Last 3 Encounters:  04/01/13 117 lb 4 oz (53.184 kg)  09/09/12 117 lb 6.4  oz (53.252 kg)  02/07/12 117 lb 6.4 oz (53.252 kg)   Constitutional: She appears well-developed and well-nourished. No distress.  HENT: Head: Normocephalic and atraumatic. Sinuses nontender to palpation. Ears: B TMs ok, no erythema or effusion; Nose: Nose normal. Mouth/Throat: Oropharynx is clear and moist, but cobblestoning with clear PND evident. No oropharyngeal exudate.  Eyes: Conjunctivae and EOM are normal. Pupils are equal, round, and reactive to light. No scleral icterus.  Neck: Normal range of motion. Neck supple. No JVD present. No thyromegaly present.  Cardiovascular: Normal rate, regular rhythm and normal  heart sounds.  No murmur heard. No BLE edema. Pulmonary/Chest: Effort normal and breath sounds normal. No respiratory distress. She has no wheezes.  Abdominal: Soft. Bowel sounds are normal. She exhibits no distension. There is no tenderness. no masses Musculoskeletal: Normal range of motion, no joint effusions. No gross deformities Neurological: She is alert and oriented to person, place, and time. No cranial nerve deficit. Coordination normal.  Skin: Skin is warm and dry. No rash noted. No erythema.  Psychiatric: She has a normal mood and affect. Her behavior is normal. Judgment and thought content normal.       Lab Results  Component Value Date   WBC 4.5 07/13/2010   HGB 13.2 07/13/2010   HCT 38.5 07/13/2010   PLT 212.0 07/13/2010   CHOL 149 02/07/2012   TRIG 96.0 02/07/2012   HDL 52.90 02/07/2012   LDLDIRECT 193.0 07/13/2010   ALT 13 02/07/2012   AST 21 02/07/2012   NA 140 07/13/2010   K 4.4 07/13/2010   CL 103 07/13/2010   CREATININE 0.6 07/13/2010   BUN 19 07/13/2010   CO2 32 07/13/2010   TSH 2.06 07/13/2010     Assessment & Plan:   AWV/v70.0 - Today patient counseled on age appropriate routine health concerns for screening and prevention, each reviewed and up to date or declined. Immunizations reviewed and up to date or declined. Labs reviewed. Risk factors for depression reviewed and negative. Hearing function and visual acuity are intact. ADLs screened and addressed as needed. Functional ability and level of safety reviewed and appropriate. Education, counseling and referrals performed based on assessed risks today. Patient provided with a copy of personalized plan for preventive services.  Fatigue - nonspecific symptoms/exam - check screening labs  Cough -related to allergic rhinitis with postnasal drip. No evidence for infection. IM Medrol today and begin antihistamine daily  -patient understands and agrees, will call if symptoms unimproved or sooner if worse  Also See problem list.  Medications and labs reviewed today.

## 2013-04-01 NOTE — Assessment & Plan Note (Signed)
The current medical regimen is effective;  continue present plan and medications. BP Readings from Last 3 Encounters:  09/19/12 122/57  09/09/12 122/70  02/07/12 126/68

## 2013-04-01 NOTE — Assessment & Plan Note (Signed)
On statin for same - Check lipids and LFTs annually

## 2013-04-01 NOTE — Patient Instructions (Signed)
It was good to see you today. The cough is being caused from allergy symptoms, no evidence for infection Steroid shot given today for cough and allergies Also begin Zyrtec (or Claritin) once daily for next 6 weeks to control allergy symptoms Other medications reviewed and updated, refills provided Health Maintenance reviewed - all recommended immunizations and age-appropriate screenings are up-to-date. Consider the shingles vaccination as discussed and we recommend a flu vaccination every fall Test(s) ordered today. Your results will be released to MyChart (or called to you) after review, usually within 72hours after test completion. If any changes need to be made, you will be notified at that same time. we'll make referral for mammogram screening at [facility]. Our office will contact you regarding appointment(s) once made. My office will call you regarding coordinating your next prolia injection - due now Please schedule followup in 6-12 months for review, call sooner if problems.   Health Maintenance, Females A healthy lifestyle and preventative care can promote health and wellness.  Maintain regular health, dental, and eye exams.  Eat a healthy diet. Foods like vegetables, fruits, whole grains, low-fat dairy products, and lean protein foods contain the nutrients you need without too many calories. Decrease your intake of foods high in solid fats, added sugars, and salt. Get information about a proper diet from your caregiver, if necessary.  Regular physical exercise is one of the most important things you can do for your health. Most adults should get at least 150 minutes of moderate-intensity exercise (any activity that increases your heart rate and causes you to sweat) each week. In addition, most adults need muscle-strengthening exercises on 2 or more days a week.   Maintain a healthy weight. The body mass index (BMI) is a screening tool to identify possible weight problems. It provides an  estimate of body fat based on height and weight. Your caregiver can help determine your BMI, and can help you achieve or maintain a healthy weight. For adults 20 years and older:  A BMI below 18.5 is considered underweight.  A BMI of 18.5 to 24.9 is normal.  A BMI of 25 to 29.9 is considered overweight.  A BMI of 30 and above is considered obese.  Maintain normal blood lipids and cholesterol by exercising and minimizing your intake of saturated fat. Eat a balanced diet with plenty of fruits and vegetables. Blood tests for lipids and cholesterol should begin at age 71 and be repeated every 5 years. If your lipid or cholesterol levels are high, you are over 50, or you are a high risk for heart disease, you may need your cholesterol levels checked more frequently.Ongoing high lipid and cholesterol levels should be treated with medicines if diet and exercise are not effective.  If you smoke, find out from your caregiver how to quit. If you do not use tobacco, do not start.  If you are pregnant, do not drink alcohol. If you are breastfeeding, be very cautious about drinking alcohol. If you are not pregnant and choose to drink alcohol, do not exceed 1 drink per day. One drink is considered to be 12 ounces (355 mL) of beer, 5 ounces (148 mL) of wine, or 1.5 ounces (44 mL) of liquor.  Avoid use of street drugs. Do not share needles with anyone. Ask for help if you need support or instructions about stopping the use of drugs.  High blood pressure causes heart disease and increases the risk of stroke. Blood pressure should be checked at least every 1  to 2 years. Ongoing high blood pressure should be treated with medicines, if weight loss and exercise are not effective.  If you are 44 to 72 years old, ask your caregiver if you should take aspirin to prevent strokes.  Diabetes screening involves taking a blood sample to check your fasting blood sugar level. This should be done once every 3 years, after  age 32, if you are within normal weight and without risk factors for diabetes. Testing should be considered at a younger age or be carried out more frequently if you are overweight and have at least 1 risk factor for diabetes.  Breast cancer screening is essential preventative care for women. You should practice "breast self-awareness." This means understanding the normal appearance and feel of your breasts and may include breast self-examination. Any changes detected, no matter how small, should be reported to a caregiver. Women in their 10s and 30s should have a clinical breast exam (CBE) by a caregiver as part of a regular health exam every 1 to 3 years. After age 31, women should have a CBE every year. Starting at age 75, women should consider having a mammogram (breast X-ray) every year. Women who have a family history of breast cancer should talk to their caregiver about genetic screening. Women at a high risk of breast cancer should talk to their caregiver about having an MRI and a mammogram every year.  The Pap test is a screening test for cervical cancer. Women should have a Pap test starting at age 89. Between ages 18 and 22, Pap tests should be repeated every 2 years. Beginning at age 16, you should have a Pap test every 3 years as long as the past 3 Pap tests have been normal. If you had a hysterectomy for a problem that was not cancer or a condition that could lead to cancer, then you no longer need Pap tests. If you are between ages 31 and 44, and you have had normal Pap tests going back 10 years, you no longer need Pap tests. If you have had past treatment for cervical cancer or a condition that could lead to cancer, you need Pap tests and screening for cancer for at least 20 years after your treatment. If Pap tests have been discontinued, risk factors (such as a new sexual partner) need to be reassessed to determine if screening should be resumed. Some women have medical problems that increase  the chance of getting cervical cancer. In these cases, your caregiver may recommend more frequent screening and Pap tests.  The human papillomavirus (HPV) test is an additional test that may be used for cervical cancer screening. The HPV test looks for the virus that can cause the cell changes on the cervix. The cells collected during the Pap test can be tested for HPV. The HPV test could be used to screen women aged 65 years and older, and should be used in women of any age who have unclear Pap test results. After the age of 79, women should have HPV testing at the same frequency as a Pap test.  Colorectal cancer can be detected and often prevented. Most routine colorectal cancer screening begins at the age of 63 and continues through age 23. However, your caregiver may recommend screening at an earlier age if you have risk factors for colon cancer. On a yearly basis, your caregiver may provide home test kits to check for hidden blood in the stool. Use of a small camera at the end of  a tube, to directly examine the colon (sigmoidoscopy or colonoscopy), can detect the earliest forms of colorectal cancer. Talk to your caregiver about this at age 46, when routine screening begins. Direct examination of the colon should be repeated every 5 to 10 years through age 43, unless early forms of pre-cancerous polyps or small growths are found.  Hepatitis C blood testing is recommended for all people born from [birth year] through 1965 and any individual with known risks for hepatitis C.  Practice safe sex. Use condoms and avoid high-risk sexual practices to reduce the spread of sexually transmitted infections (STIs). Sexually active women aged 2 and younger should be checked for Chlamydia, which is a common sexually transmitted infection. Older women with new or multiple partners should also be tested for Chlamydia. Testing for other STIs is recommended if you are sexually active and at increased risk.  Osteoporosis is a  disease in which the bones lose minerals and strength with aging. This can result in serious bone fractures. The risk of osteoporosis can be identified using a bone density scan. Women ages 72 and over and women at risk for fractures or osteoporosis should discuss screening with their caregivers. Ask your caregiver whether you should be taking a calcium supplement or vitamin D to reduce the rate of osteoporosis.  Menopause can be associated with physical symptoms and risks. Hormone replacement therapy is available to decrease symptoms and risks. You should talk to your caregiver about whether hormone replacement therapy is right for you.  Use sunscreen with a sun protection factor (SPF) of 30 or greater. Apply sunscreen liberally and repeatedly throughout the day. You should seek shade when your shadow is shorter than you. Protect yourself by wearing long sleeves, pants, a wide-brimmed hat, and sunglasses year round, whenever you are outdoors.  Notify your caregiver of new moles or changes in moles, especially if there is a change in shape or color. Also notify your caregiver if a mole is larger than the size of a pencil eraser.  Stay current with your immunizations. Document Released: 07/02/2011 Document Revised: 03/10/2012 Document Reviewed: 07/02/2011 [facility] Patient Information 2013 [place], [place].

## 2013-04-01 NOTE — Assessment & Plan Note (Signed)
Seasonal flare causing dry cough tx with medrol IM today recommended daily zyrtec for next 4-6 weeks

## 2013-04-03 ENCOUNTER — Other Ambulatory Visit: Payer: Self-pay | Admitting: Internal Medicine

## 2013-04-04 ENCOUNTER — Other Ambulatory Visit: Payer: Self-pay | Admitting: Internal Medicine

## 2013-04-07 ENCOUNTER — Telehealth: Payer: Self-pay | Admitting: Internal Medicine

## 2013-04-07 MED ORDER — GUAIFENESIN ER 600 MG PO TB12: 600.0000 mg | ORAL_TABLET | Freq: Two times a day (BID) | ORAL | Status: DC

## 2013-04-07 MED ORDER — AZELASTINE HCL 0.1 % NA SOLN: 2.0000 | Freq: Two times a day (BID) | NASAL | Status: DC

## 2013-04-07 NOTE — Telephone Encounter (Signed)
Notified pt with md response.../lmb 

## 2013-04-07 NOTE — Telephone Encounter (Signed)
Given Medrol 54m IM in addition to Zyrtec daily Start astelin nose spray (rx done) and use Mucinex 600 BID (otc)

## 2013-04-07 NOTE — Telephone Encounter (Signed)
Caller: Fran/Patient; Phone: [phone]; Reason for Call: Was seen 04-01-13 and put on Zyrtec for allergies She said it is not helping.  She has a lot of head drainage which is causing a persistent dry cough.  She said drainage is to point she has to almost sit up at night She has no fever and no new sxs since her visit She was wondering since she was just in if anything else could be prescribed to help dry up all her drainage.

## 2013-04-13 ENCOUNTER — Ambulatory Visit (INDEPENDENT_AMBULATORY_CARE_PROVIDER_SITE_OTHER): Payer: Medicare Other | Admitting: *Deleted

## 2013-04-13 DIAGNOSIS — M810 Age-related osteoporosis without current pathological fracture: Secondary | ICD-10-CM

## 2013-04-13 MED ORDER — DENOSUMAB 60 MG/ML ~~LOC~~ SOLN
60.0000 mg | Freq: Once | SUBCUTANEOUS | Status: AC
  Administered 2013-04-13: 60 mg via SUBCUTANEOUS

## 2013-04-14 LAB — HM MAMMOGRAPHY: HM Mammogram: NEGATIVE

## 2013-04-15 ENCOUNTER — Encounter: Payer: Self-pay | Admitting: Internal Medicine

## 2013-10-15 ENCOUNTER — Other Ambulatory Visit: Payer: Self-pay | Admitting: Internal Medicine

## 2013-11-05 ENCOUNTER — Other Ambulatory Visit: Payer: Self-pay

## 2013-11-25 ENCOUNTER — Telehealth: Payer: Self-pay

## 2013-11-25 ENCOUNTER — Other Ambulatory Visit: Payer: Self-pay | Admitting: Internal Medicine

## 2013-11-25 NOTE — Telephone Encounter (Signed)
Script for Tramadol was sent to pharmacy on 11/25/2013 at 1:40 pm /met.  To # [phone]

## 2014-03-26 ENCOUNTER — Ambulatory Visit (INDEPENDENT_AMBULATORY_CARE_PROVIDER_SITE_OTHER): Payer: Medicare Other | Admitting: Internal Medicine

## 2014-03-26 ENCOUNTER — Encounter: Payer: Self-pay | Admitting: Internal Medicine

## 2014-03-26 VITALS — BP 98/50 | HR 103 | Temp 98.3°F | Resp 14 | Wt 117.0 lb

## 2014-03-26 DIAGNOSIS — I10 Essential (primary) hypertension: Secondary | ICD-10-CM

## 2014-03-26 DIAGNOSIS — K219 Gastro-esophageal reflux disease without esophagitis: Secondary | ICD-10-CM

## 2014-03-26 DIAGNOSIS — R059 Cough, unspecified: Secondary | ICD-10-CM

## 2014-03-26 DIAGNOSIS — E785 Hyperlipidemia, unspecified: Secondary | ICD-10-CM

## 2014-03-26 DIAGNOSIS — R05 Cough: Secondary | ICD-10-CM

## 2014-03-26 DIAGNOSIS — J310 Chronic rhinitis: Secondary | ICD-10-CM

## 2014-03-26 MED ORDER — LOSARTAN POTASSIUM-HCTZ 50-12.5 MG PO TABS: 1.0000 | ORAL_TABLET | Freq: Every day | ORAL | Status: DC

## 2014-03-26 NOTE — Progress Notes (Signed)
Pre visit review using our clinic review tool, if applicable. No additional management support is needed unless otherwise documented below in the visit note. 

## 2014-03-26 NOTE — Progress Notes (Signed)
   Subjective:    Patient ID: Regina Ramsey, female    DOB: [birth year], 73 y.o.   MRN: [id number]  HPI Symptoms began 4 days ago as dry cough, no sputum. She describes PND with clear nasal secretions.  She describes this cough as perennial during spring, summer, and fall. Her history is significant for allergy-triggered asthma.  She used her rescue inhaler 1 puff two hours ago.  She has coughed to the point of the her chest and stomach becoming sore.  She has history of GERD; she took omeprazole in the past and after weight loss she felt she no longer needed the medication.  Also, she takes Lisinopril-HCTZ.   She reports daily dysphagia and dyspepsia.   Review of Systems  Denies fever, chills, sweats, headache, maxillary or dental pain/pressure. Denies colored secretions or sputum.  Objective:   Physical Exam General appearance:good health ;well nourished; no acute distress or increased work of breathing is present.  No  lymphadenopathy about the head, neck, or axilla noted.  Eyes: No conjunctival inflammation or lid edema is present. There is no scleral icterus. Ears:  External ear exam shows no significant lesions or deformities.  Otoscopic examination reveals clear canals, tympanic membranes are intact bilaterally without bulging, retraction, inflammation or discharge. Nose:  External nasal examination shows no deformity or inflammation. Nasal mucosa are pink and moist without lesions or exudates. No septal dislocation or deviation.No obstruction to airflow.  Oral exam: Dental hygiene is good; lips and gums are healthy appearing.There is no oropharyngeal erythema or exudate noted.  Neck:  No deformities, thyromegaly, masses, or tenderness noted.   Supple with full range of motion without pain.  Heart:  Normal rate and regular rhythm. S1 and S2 normal without gallop, murmur, click, rub or other extra sounds.  Lungs:Chest clear to auscultation; no wheezes, rhonchi,rales ,or rubs  present.No increased work of breathing.   Extremities:  No cyanosis, edema, or clubbing  noted  Skin: Warm & dry w/o jaundice or tenting.     Assessment & Plan:  #1 GERD - omeprazole  #2 Allergy-induced asthma - Singulair (?) and nasal cleansing

## 2014-03-26 NOTE — Progress Notes (Signed)
   Subjective:    Patient ID: Regina Ramsey, female    DOB: 01/22/1941, 73 y.o.   MRN: [id number]  HPI Symptoms began 4 days ago as dry cough, no sputum. She describes PND with clear nasal secretions.  She describes this cough as perennial occuring during Spring, Summer, and Fall. Her history is significant for allergy-triggered asthma.  She used her rescue inhaler 1 puff two hours ago w/o benefit for the cough She has coughed to the point of the her chest and stomach becoming sore.  She has history of GERD; she took omeprazole in the past and after weight loss she felt she no longer needed the medication.  Also, she takes Lisinopril-HCTZ.  She reports daily dysphagia and dyspepsia over past 4 days   PMH of Osteoporosis. Concerned about cost of Crestor.    Review of Systems Denies fever, chills, sweats, headache, maxillary or dental pain/pressure. Denies colored secretions or sputum.  No unexplained weight loss, melena or rectal bleeding     Objective:   Physical Exam General appearance:good health ;well nourished; no acute distress or increased work of breathing is present. No lymphadenopathy about the head, neck, or axilla noted. Appears younger than stated age Eyes: No conjunctival inflammation or lid edema is present. Ears: External ear exam shows no significant lesions or deformities. Otoscopic examination reveals clear canals, tympanic membranes are intact bilaterally without bulging, retraction, inflammation or discharge.  Nose: External nasal examination shows no deformity or inflammation. Nasal mucosa are pink and moist without lesions or exudates. No septal dislocation or deviation.No obstruction to airflow.  Oral exam: Dental hygiene is good; lips and gums are healthy appearing.There is no oropharyngeal erythema or exudate noted.  Neck: No deformities, thyromegaly, masses, or tenderness noted.   Heart: Normal rate and regular rhythm. S1 and S2 normal without gallop,  murmur, click, rub or other extra sounds.  Lungs:Chest clear to auscultation; no wheezes, rhonchi,rales ,or rubs present.No increased work of breathing.  Extremities: No cyanosis, edema, or clubbing noted  Skin: Warm & dry .     Assessment & Plan:  #1 cough with multiple possible contributing or etiologic factors (rhinitis, GERD, asthma, ACE-I ) #2 rhinitis #3 GERD #4 hyperlipidemia See  Orders & AVS  Baseline lipids as prelude to possible change in statin

## 2014-03-26 NOTE — Patient Instructions (Addendum)
Plain Mucinex (NOT D) for thick secretions ;force NON dairy fluids .   Nasal cleansing in the shower as discussed with lather of mild shampoo.After 10 seconds wash off lather while  exhaling through nostrils. Make sure that all residual soap is removed to prevent irritation.  Flonase OR Nasacort AQ 1 spray in each nostril twice a day as needed. Use the "crossover" technique into opposite nostril spraying toward opposite ear @ 45 degree angle, not straight up into nostril.  Use a Neti pot daily only  as needed for significant sinus congestion; going from open side to congested side . Plain Allegra (NOT D )  160 daily , Loratidine 10 mg , OR Zyrtec 10 mg @ bedtime  as needed for itchy eyes & sneezing.Reflux of gastric acid may be asymptomatic as this may occur mainly during sleep.The triggers for reflux  include stress; the "aspirin family" ; alcohol; peppermint; and caffeine (coffee, tea, cola, and chocolate). The aspirin family would include aspirin and the nonsteroidal agents such as ibuprofen &  Naproxen. Tylenol would not cause reflux. If having symptoms ; food & drink should be avoided for @ least 2 hours before going to bed.  Until the cough resolves; take the protein pump inhibitor 30 minutes before breakfast and 30 minutes before the evening meal. Once the cough has been resolved for at least a week PPI could be stopped

## 2014-03-27 ENCOUNTER — Telehealth: Payer: Self-pay | Admitting: Internal Medicine

## 2014-03-27 NOTE — Telephone Encounter (Signed)
Relevant patient education assigned to patient using Emmi. ° °

## 2014-03-30 ENCOUNTER — Ambulatory Visit (INDEPENDENT_AMBULATORY_CARE_PROVIDER_SITE_OTHER): Payer: Medicare Other

## 2014-03-30 DIAGNOSIS — Z23 Encounter for immunization: Secondary | ICD-10-CM

## 2014-03-30 DIAGNOSIS — Z2911 Encounter for prophylactic immunotherapy for respiratory syncytial virus (RSV): Secondary | ICD-10-CM

## 2014-05-02 ENCOUNTER — Other Ambulatory Visit: Payer: Self-pay | Admitting: Internal Medicine

## 2014-05-05 ENCOUNTER — Other Ambulatory Visit: Payer: Self-pay | Admitting: Internal Medicine

## 2014-05-05 ENCOUNTER — Other Ambulatory Visit: Payer: Self-pay | Admitting: *Deleted

## 2014-05-05 NOTE — Telephone Encounter (Signed)
Received fax pt wanting refill on her lisinopril 10/12.5mg . Faxed form back denied med was d/c on 03/26/14 was change to losartan...Regina Ramsey

## 2014-06-29 ENCOUNTER — Other Ambulatory Visit (INDEPENDENT_AMBULATORY_CARE_PROVIDER_SITE_OTHER): Payer: Medicare Other

## 2014-06-29 ENCOUNTER — Ambulatory Visit (INDEPENDENT_AMBULATORY_CARE_PROVIDER_SITE_OTHER): Payer: Medicare Other | Admitting: Internal Medicine

## 2014-06-29 ENCOUNTER — Encounter: Payer: Self-pay | Admitting: Internal Medicine

## 2014-06-29 VITALS — BP 132/72 | HR 95 | Temp 98.2°F | Wt 120.0 lb

## 2014-06-29 DIAGNOSIS — N9089 Other specified noninflammatory disorders of vulva and perineum: Secondary | ICD-10-CM

## 2014-06-29 DIAGNOSIS — K5730 Diverticulosis of large intestine without perforation or abscess without bleeding: Secondary | ICD-10-CM

## 2014-06-29 DIAGNOSIS — E785 Hyperlipidemia, unspecified: Secondary | ICD-10-CM

## 2014-06-29 DIAGNOSIS — Z8719 Personal history of other diseases of the digestive system: Secondary | ICD-10-CM

## 2014-06-29 DIAGNOSIS — Z8742 Personal history of other diseases of the female genital tract: Secondary | ICD-10-CM

## 2014-06-29 LAB — LIPID PANEL
Cholesterol: 147 mg/dL (ref 0–200)
HDL: 48.9 mg/dL (ref >39.00)
LDL Cholesterol: 66 mg/dL (ref 0–99)
NONHDL: 98.1
Total CHOL/HDL Ratio: 3
Triglycerides: 163 mg/dL — ABNORMAL HIGH (ref 0.0–149.0)
VLDL: 32.6 mg/dL (ref 0.0–40.0)

## 2014-06-29 LAB — CBC WITH DIFFERENTIAL/PLATELET
BASOS ABS: 0 10*3/uL (ref 0.0–0.1)
Basophils Relative: 0.5 % (ref 0.0–3.0)
Eosinophils Absolute: 0.1 10*3/uL (ref 0.0–0.7)
Eosinophils Relative: 2.2 % (ref 0.0–5.0)
HCT: 36.5 % (ref 36.0–46.0)
Hemoglobin: 12.2 g/dL (ref 12.0–15.0)
LYMPHS PCT: 24.8 % (ref 12.0–46.0)
Lymphs Abs: 1.6 10*3/uL (ref 0.7–4.0)
MCHC: 33.5 g/dL (ref 30.0–36.0)
MCV: 96.3 fl (ref 78.0–100.0)
Monocytes Absolute: 0.9 10*3/uL (ref 0.1–1.0)
Monocytes Relative: 13.8 % — ABNORMAL HIGH (ref 3.0–12.0)
Neutro Abs: 3.8 10*3/uL (ref 1.4–7.7)
Neutrophils Relative %: 58.7 % (ref 43.0–77.0)
PLATELETS: 251 10*3/uL (ref 150.0–400.0)
RBC: 3.79 Mil/uL — ABNORMAL LOW (ref 3.87–5.11)
RDW: 12.8 % (ref 11.5–15.5)
WBC: 6.5 10*3/uL (ref 4.0–10.5)

## 2014-06-29 LAB — HEPATIC FUNCTION PANEL
ALK PHOS: 40 U/L (ref 39–117)
ALT: 13 U/L (ref 0–35)
AST: 22 U/L (ref 0–37)
Albumin: 3.8 g/dL (ref 3.5–5.2)
BILIRUBIN TOTAL: 0.6 mg/dL (ref 0.2–1.2)
Bilirubin, Direct: 0.1 mg/dL (ref 0.0–0.3)
Total Protein: 6.8 g/dL (ref 6.0–8.3)

## 2014-06-29 LAB — BASIC METABOLIC PANEL
BUN: 18 mg/dL (ref 6–23)
CO2: 32 mEq/L (ref 19–32)
Calcium: 9.2 mg/dL (ref 8.4–10.5)
Chloride: 102 mEq/L (ref 96–112)
Creatinine, Ser: 0.8 mg/dL (ref 0.4–1.2)
GFR: 75.77 mL/min (ref >60.00)
GLUCOSE: 129 mg/dL — AB (ref 70–99)
Potassium: 3.5 mEq/L (ref 3.5–5.1)
SODIUM: 141 meq/L (ref 135–145)

## 2014-06-29 NOTE — Progress Notes (Signed)
   Subjective:    Patient ID: Regina Ramsey, female    DOB: 12/08/1941, 73 y.o.   MRN: [id number]  HPI Pt began having pus in her stool and urine approximately 06/11/14. A few days later the pus subsided  and the pt noticed a cyst-like growth in her perianal area on 06/15/14. The growth was constantly bleeding small amounts from 06/15/14-06/22/14. She wore one period pad per day until the bleeding stopped. Since the bleeding as stopped the cyst has reduced in size. The pt was unable to visualize the cyst and thus cannot describe the nature of the cyst.   The cyst is not painful nor does it itch. She is no longer have discomfort upon defecating or urinating.    Review of Systems  Constitutional: Negative for fever and chills.  Respiratory: Negative for shortness of breath.   Gastrointestinal: Negative for nausea, vomiting and diarrhea.  Skin: Negative for rash.      Objective:   Physical Exam        Assessment & Plan:

## 2014-06-29 NOTE — Progress Notes (Signed)
Pre visit review using our clinic review tool, if applicable. No additional management support is needed unless otherwise documented below in the visit note. 

## 2014-06-29 NOTE — Patient Instructions (Signed)
Your next office appointment will be determined based upon review of your pending labs . Those instructions will be transmitted to you through My Chart  OR  by mail;whichever process is your choice to receive results & recommendations .  Followup as needed for your acute issue. Please report any significant change in your symptoms.  The Gyn referral will be scheduled and you'll be notified of the time.

## 2014-06-29 NOTE — Progress Notes (Signed)
   Subjective:    Patient ID: Regina Ramsey, female    DOB: 1941-04-27, 73 y.o.   MRN: [id number]  HPI She noted pus which she described as brownish in her stool and urine proximally 06/11/14. A few days later that subsided and then she noted a cystlike growth in the perineal area. This was associated with small amounts of blood production from 6/16-6/23. She noted that once the bleeding stopped the cyst had reduced in size.  There was no associated pain or itching with the cyst.  Past medical history includes colitis and diverticulosis as a teenager. She's had no GI followup for several decades. She's also not seen her gynecologist for an extended period time. There's also a past medical history of endometriosis.    Review of Systems Unexplained weight loss, abdominal pain, significant dyspepsia, dysphagia, melena, definite rectal bleeding, or persistently small caliber stools are denied.  She denies fever, chills, sweats    Objective:   Physical Exam General appearance is one of good health and nourishment w/o distress.  Eyes: No conjunctival inflammation or scleral icterus is present.  Oral exam: Dental hygiene is good; lips and gums are healthy appearing.There is no oropharyngeal erythema or exudate noted.   Heart:  Normal rate and regular rhythm. S1 and S2 normal . Grade 1/2 systolic murmur. Lungs:Chest clear to auscultation; no wheezes, rhonchi,rales ,or rubs present.No increased work of breathing.   Abdomen: bowel sounds normal, soft and non-tender without masses, organomegaly or hernias noted.  No guarding or rebound . No tenderness over the flanks to percussion   Rectal and perineal areas reveal no lesions.  Musculoskeletal: Able to lie flat and sit up without help. Negative straight leg raising bilaterally. Gait normal  Skin:Warm & dry.  Intact without suspicious lesions or rashes ; no jaundice or tenting.   Lymphatic: No lymphadenopathy is noted about the head, neck,  axilla, or inguinal areas.   Pysch: "[place]ifference" suggested by her affect. Laughing about lack of Gyn & GI survelliance.               Assessment & Plan:  #1 history of pus in the urine and stool described as brownish, associated with bleeding from a perineal cyst.  #2 past history of endometriosis, diverticulosis and colitis; rule out rectovaginal fistula.  Plan: See orders.  Gynecologic evaluation. If that's negative; she'll be referred to GI

## 2014-06-30 ENCOUNTER — Ambulatory Visit: Payer: Medicare Other

## 2014-06-30 ENCOUNTER — Telehealth: Payer: Self-pay

## 2014-06-30 DIAGNOSIS — R7309 Other abnormal glucose: Secondary | ICD-10-CM

## 2014-06-30 DIAGNOSIS — Z8742 Personal history of other diseases of the female genital tract: Secondary | ICD-10-CM | POA: Insufficient documentation

## 2014-06-30 DIAGNOSIS — K5730 Diverticulosis of large intestine without perforation or abscess without bleeding: Secondary | ICD-10-CM | POA: Insufficient documentation

## 2014-06-30 LAB — HEMOGLOBIN A1C: HEMOGLOBIN A1C: 5.8 % (ref 4.6–6.5)

## 2014-06-30 NOTE — Telephone Encounter (Signed)
Request faxed to lab 

## 2014-06-30 NOTE — Telephone Encounter (Signed)
Message copied by Shelly Coss on Wed Jun 30, 2014  1:10 PM ------      Message from: Hendricks Limes      Created: Wed Jun 30, 2014 12:37 PM       Please add A1c (790.29)       ------

## 2014-07-05 ENCOUNTER — Other Ambulatory Visit: Payer: Self-pay | Admitting: Internal Medicine

## 2014-07-06 ENCOUNTER — Telehealth: Payer: Self-pay | Admitting: *Deleted

## 2014-07-06 MED ORDER — ATORVASTATIN CALCIUM 20 MG PO TABS: 20.0000 mg | ORAL_TABLET | Freq: Every day | ORAL | Status: DC

## 2014-07-06 NOTE — Telephone Encounter (Signed)
Notified pt spoke with husband inform him md sent over generic lipitor...Regina Ramsey

## 2014-07-06 NOTE — Telephone Encounter (Signed)
Faxed script back to [facility].../lmb 

## 2014-07-06 NOTE — Telephone Encounter (Signed)
Left msg on triage stating pt has been taking crestor for a while she is complaining about the price she states it too expensive. Wanting to see if md can change to a generic statin something equivalent...Regina Ramsey

## 2014-07-06 NOTE — Telephone Encounter (Signed)
Ok for generic - atorva 20mg  daily is equivalent to current crestor dose - erx atorva 20 done

## 2014-07-23 ENCOUNTER — Encounter: Payer: Self-pay | Admitting: Gynecology

## 2014-07-23 ENCOUNTER — Ambulatory Visit (INDEPENDENT_AMBULATORY_CARE_PROVIDER_SITE_OTHER): Payer: Medicare Other | Admitting: Gynecology

## 2014-07-23 VITALS — BP 124/78 | Ht 59.0 in | Wt 118.0 lb

## 2014-07-23 DIAGNOSIS — R102 Pelvic and perineal pain: Secondary | ICD-10-CM

## 2014-07-23 DIAGNOSIS — N952 Postmenopausal atrophic vaginitis: Secondary | ICD-10-CM | POA: Insufficient documentation

## 2014-07-23 DIAGNOSIS — IMO0002 Reserved for concepts with insufficient information to code with codable children: Secondary | ICD-10-CM

## 2014-07-23 DIAGNOSIS — N949 Unspecified condition associated with female genital organs and menstrual cycle: Secondary | ICD-10-CM

## 2014-07-23 MED ORDER — ESTRADIOL 10 MCG VA TABS: 1.0000 | ORAL_TABLET | VAGINAL | Status: DC

## 2014-07-23 MED ORDER — LIDOCAINE HCL 2 % EX GEL: CUTANEOUS | Status: DC

## 2014-07-23 NOTE — Patient Instructions (Signed)
Estradiol vaginal tablets What is this medicine? ESTRADIOL (es tra DYE ole) vaginal tablet is used to help relieve symptoms of vaginal irritation and dryness that occurs in some women during menopause. This medicine may be used for other purposes; ask your health care provider or pharmacist if you have questions. COMMON BRAND NAME(S): Vagifem What should I tell my health care provider before I take this medicine? They need to know if you have any of these conditions: -abnormal vaginal bleeding -blood vessel disease or blood clots -breast, cervical, endometrial, ovarian, liver, or uterine cancer -dementia -diabetes -gallbladder disease -heart disease or recent heart attack -high blood pressure -high cholesterol -high level of calcium in the blood -hysterectomy -kidney disease -liver disease -migraine headaches -protein C deficiency -protein S deficiency -stroke -systemic lupus erythematosus (SLE) -tobacco smoker -an unusual or allergic reaction to estrogens, other hormones, medicines, foods, dyes, or preservatives -pregnant or trying to get pregnant -breast-feeding How should I use this medicine? This medicine is only for use in the vagina. Do not take by mouth. Wash your hands before and after use. Read package directions carefully. Unwrap the pre-filled applicator package. Lie on your back, part and bend your knees. Gently insert the applicator tip high in the vagina and push the plunger to release the tablet into the vagina. Gently remove the applicator. Throw away the applicator after use. Do not use your medicine more often than directed. Finish the full course prescribed by your doctor or health care professional even if you think your condition is better. Do not stop using except on the advice of your doctor or health care professional. Talk to your pediatrician regarding the use of this medicine in children. A patient package insert for the product will be given with each  prescription and refill. Read this sheet carefully each time. The sheet may change frequently. Overdosage: If you think you have taken too much of this medicine contact a poison control center or emergency room at once. NOTE: This medicine is only for you. Do not share this medicine with others. What if I miss a dose? If you miss a dose, take it as soon as you can. If it is almost time for your next dose, take only that dose. Do not take double or extra doses. What may interact with this medicine? Do not take this medicine with any of the following medications: -aromatase inhibitors like aminoglutethimide, anastrozole, exemestane, letrozole, testolactone This medicine may also interact with the following medications: -antibiotics used to treat tuberculosis like rifabutin, rifampin and rifapentene -raloxifene or tamoxifen -warfarin This list may not describe all possible interactions. Give your health care provider a list of all the medicines, herbs, non-prescription drugs, or dietary supplements you use. Also tell them if you smoke, drink alcohol, or use illegal drugs. Some items may interact with your medicine. What should I watch for while using this medicine? Visit your health care professional for regular checks on your progress. You will need a regular breast and pelvic exam. You should also discuss the need for regular mammograms with your health care professional, and follow his or her guidelines. This medicine can make your body retain fluid, making your fingers, hands, or ankles swell. Your blood pressure can go up. Contact your doctor or health care professional if you feel you are retaining fluid. If you have any reason to think you are pregnant; stop taking this medicine at once and contact your doctor or health care professional. Tobacco smoking increases the risk of getting  a blood clot or having a stroke, especially if you are more than 73 years old. You are strongly advised not to  smoke. If you wear contact lenses and notice visual changes, or if the lenses begin to feel uncomfortable, consult your eye care specialist. If you are going to have elective surgery, you may need to stop taking this medicine beforehand. Consult your health care professional for advice prior to scheduling the surgery. What side effects may I notice from receiving this medicine? Side effects that you should report to your doctor or health care professional as soon as possible: -allergic reactions like skin rash, itching or hives, swelling of the face, lips, or tongue -breast tissue changes or discharge -changes in vision -chest pain -confusion, trouble speaking or understanding -dark urine -general ill feeling or flu-like symptoms -light-colored stools -nausea, vomiting -pain, swelling, warmth in the leg -right upper belly pain -severe headaches -shortness of breath -sudden numbness or weakness of the face, arm or leg -trouble walking, dizziness, loss of balance or coordination -unusual vaginal bleeding -yellowing of the eyes or skin Side effects that usually do not require medical attention (report to your doctor or health care professional if they continue or are bothersome): -hair loss -increased hunger or thirst -increased urination -symptoms of vaginal infection like itching, irritation or unusual discharge -unusually weak or tired This list may not describe all possible side effects. Call your doctor for medical advice about side effects. You may report side effects to FDA at 1-800-FDA-1088. Where should I keep my medicine? Keep out of the reach of children. Store at room temperature between 15 and 30 degrees C (59 and 86 degrees F). Throw away any unused medicine after the expiration date. NOTE: This sheet is a summary. It may not cover all possible information. If you have questions about this medicine, talk to your doctor, pharmacist, or health care provider.  2015,  [employer]vier/Gold Standard. (2011-03-21 09:08:58)

## 2014-07-23 NOTE — Progress Notes (Signed)
   73 year old patient knew to the practice who several weeks ago had noted a cystlike area near her external genital area at which she describes this some oozing and bloody discharge was some swollen lymph node in resolve spontaneously. She had seen her PCP and he did not see anything and she also presented to [place] for a second opinion as well as to discuss her issues with vaginal atrophy and dyspareunia. Patient has describe some nonspecific occasional left lower quadrant discomfort. She has no GU or GI complaints today.  Patient in 1974 had a total abdominal hysterectomy as a result of endometriosis. She denies any recent vaginal bleeding. Dr. Linna Darner history the patient for osteoporosis, hyperlipidemia and anemia.  Exam: Bartholin's urethra Skene glands with atrophic changes Vagina: Atrophic changes but no lesions seen Vaginal cuff intact no lesions seen friable contact due to severe atrophy. Very narrow vaginal tube. Rectal exam unremarkable  Assessment/plan: Postmenopausal severe vaginal atrophy contributing to her dyspareunia. Past history of abdominal hysterectomy as a result of endometriosis. Patient has not used any hormonal replacement therapy for many years. We discussed the utilization of Vagifem 10 mcg intravaginally twice a week. That she could use 2% lidocaine gel at time of intercourse. Were going to order for her a series of vaginal dilators so a combination with the above will hopefully improve her sexual activity with her partner. She will return back to the office within 1 week for an ultrasound for better assessment of her remaining ovaries. The risks benefits and pros and cons of Vagifem were discussed. Literature information was provided.

## 2014-08-04 DIAGNOSIS — Z0289 Encounter for other administrative examinations: Secondary | ICD-10-CM

## 2014-08-18 ENCOUNTER — Encounter: Payer: Self-pay | Admitting: Gynecology

## 2014-08-18 ENCOUNTER — Ambulatory Visit (INDEPENDENT_AMBULATORY_CARE_PROVIDER_SITE_OTHER): Payer: Medicare Other

## 2014-08-18 ENCOUNTER — Ambulatory Visit (INDEPENDENT_AMBULATORY_CARE_PROVIDER_SITE_OTHER): Payer: Medicare Other | Admitting: Gynecology

## 2014-08-18 ENCOUNTER — Other Ambulatory Visit: Payer: Self-pay | Admitting: Gynecology

## 2014-08-18 VITALS — BP 116/74

## 2014-08-18 DIAGNOSIS — IMO0002 Reserved for concepts with insufficient information to code with codable children: Secondary | ICD-10-CM

## 2014-08-18 DIAGNOSIS — N952 Postmenopausal atrophic vaginitis: Secondary | ICD-10-CM

## 2014-08-18 DIAGNOSIS — N83339 Acquired atrophy of ovary and fallopian tube, unspecified side: Secondary | ICD-10-CM

## 2014-08-18 DIAGNOSIS — R102 Pelvic and perineal pain: Secondary | ICD-10-CM

## 2014-08-18 DIAGNOSIS — N949 Unspecified condition associated with female genital organs and menstrual cycle: Secondary | ICD-10-CM

## 2014-08-18 DIAGNOSIS — N942 Vaginismus: Secondary | ICD-10-CM

## 2014-08-18 NOTE — Progress Notes (Signed)
   Patient presented to the office today to discuss her ultrasound. Patient was last seen in the office on July 24. Patient with past history of abdominal hysterectomy as a result of endometriosis. Patient had not been on hormone replacement therapy. She was started on Vagifem 10 mcg intravaginally twice a week and utilization of 2% lidocaine during time of intercourse. We had also order her a series of vaginal dilators which she has continued to use and has helped her dyspareunia and has been doing well. Since we were not able to do a complete pelvic exam because of her vaginismus this was the reason why we had ordered an ultrasound to better assess her adnexa.  Ultrasound today: Both right and left ovary were atrophic normal echo pattern. There was no adnexal masses noted.  Assessment/plan: Patient with vaginal atrophy, dyspareunia and vaginismus is doing well since initiating Vagifem 10 mcg intravaginally twice a week. Also by using 2% lidocaine gel and during intercourse as well as the use of the vaginal dilators has helped. We'll otherwise see her back in one year or when necessary. She will followed up as well with her PCP.

## 2014-09-09 ENCOUNTER — Encounter: Payer: Self-pay | Admitting: Gastroenterology

## 2014-11-01 ENCOUNTER — Encounter: Payer: Self-pay | Admitting: Gynecology

## 2014-11-09 ENCOUNTER — Ambulatory Visit: Payer: Medicare Other | Admitting: Gastroenterology

## 2014-12-04 ENCOUNTER — Ambulatory Visit (INDEPENDENT_AMBULATORY_CARE_PROVIDER_SITE_OTHER): Payer: Medicare Other | Admitting: Family Medicine

## 2014-12-04 ENCOUNTER — Encounter: Payer: Self-pay | Admitting: Family Medicine

## 2014-12-04 VITALS — BP 160/98 | Temp 98.1°F | Wt 127.0 lb

## 2014-12-04 DIAGNOSIS — J3089 Other allergic rhinitis: Secondary | ICD-10-CM

## 2014-12-04 DIAGNOSIS — K219 Gastro-esophageal reflux disease without esophagitis: Secondary | ICD-10-CM

## 2014-12-04 DIAGNOSIS — J9801 Acute bronchospasm: Secondary | ICD-10-CM

## 2014-12-04 MED ORDER — PANTOPRAZOLE SODIUM 40 MG PO TBEC: 40.0000 mg | DELAYED_RELEASE_TABLET | Freq: Every day | ORAL | Status: DC

## 2014-12-04 MED ORDER — ALBUTEROL SULFATE HFA 108 (90 BASE) MCG/ACT IN AERS: INHALATION_SPRAY | RESPIRATORY_TRACT | Status: DC

## 2014-12-04 NOTE — Progress Notes (Signed)
OFFICE NOTE  12/04/2014  CC:  Chief Complaint  Patient presents with  . heartburn    pt c/o heartburn x3days   HPI: Patient is a 73 y.o. Caucasian female who is here for heartburn. Describes hx of reflux, esp when overeats-which she has been doing a lot lately-and about 3 nights ago it hit hard in night-time.  Burning sensation in central chest, often regurg into back of throat.  She has been taking omeprazole OTC 20mg  for the last 2d and some improvement has been felt but she has also cut back on her eating quite a bit.  Allergies flaring up last few days--"mold".  Some PND and dry cough. Not on any allergy meds at this time.  Pertinent PMH:  Past medical, surgical, social, and family history reviewed and no changes are noted since last office visit.  MEDS:  Outpatient Prescriptions Prior to Visit  Medication Sig Dispense Refill  . atorvastatin (LIPITOR) 20 MG tablet Take 1 tablet (20 mg total) by mouth daily. 90 tablet 3  . Estradiol 10 MCG TABS vaginal tablet Place 1 tablet (10 mcg total) vaginally 2 (two) times a week. 8 tablet 11  . lidocaine (XYLOCAINE) 2 % jelly Apply vaginally PRN 30 mL 3  . losartan-hydrochlorothiazide (HYZAAR) 50-12.5 MG per tablet TAKE 1 TABLET BY MOUTH DAILY 30 tablet 5  . albuterol (PROVENTIL HFA;VENTOLIN HFA) 108 (90 BASE) MCG/ACT inhaler Inhale 2 puffs into the lungs every 6 (six) hours as needed. For shortness of breath    . PROAIR HFA 108 (90 BASE) MCG/ACT inhaler INHALE 2 PUFFS BY MOUTH EVERY 6 HOURS AS NEEDED FOR WHEEZING (Patient not taking: Reported on 12/04/2014) 8.5 g 0  . traMADol (ULTRAM) 50 MG tablet TAKE 1 TABLET BY MOUTH EVERY 8 HOURS AS NEEDED FOR PAIN (Patient not taking: Reported on 12/04/2014) 30 tablet 0   No facility-administered medications prior to visit.    PE: Blood pressure 160/98, temperature 98.1 F (36.7 C), weight 127 lb (57.607 kg). Gen: Alert, well appearing.  Patient is oriented to person, place, time, and  situation. ENT: Ears: EACs clear, normal epithelium.  TMs with good light reflex and landmarks bilaterally.  Eyes: no injection, icteris, swelling, or exudate.  EOMI, PERRLA. Nose: no drainage or turbinate edema/swelling.  No injection or focal lesion.  Mouth: lips without lesion/swelling.  Oral mucosa pink and moist.  Dentition intact and without obvious caries or gingival swelling.  Oropharynx without erythema, exudate, or swelling.  Neck - No masses or thyromegaly or limitation in range of motion CV: RRR, no m/r/g.   LUNGS: CTA bilat, nonlabored resps, good aeration in all lung fields. Some coughing with exhalation but no wheezing or rhonchi or prolonged exp phase.   IMPRESSION AND PLAN:  1) GERD: protonix 40mg  qd x 14d, then trial off med, continue to modify dietary habits.  2) Allergic rhinitis with some bronchospasm cough: rf proair inhaler. Pt to get some OTC allergy med "walgreen brand" that has worked well for her in the past.  An After Visit Summary was printed and given to the patient.  FOLLOW UP: prn

## 2015-01-06 ENCOUNTER — Ambulatory Visit (INDEPENDENT_AMBULATORY_CARE_PROVIDER_SITE_OTHER): Payer: Medicare Other | Admitting: Gastroenterology

## 2015-01-06 ENCOUNTER — Encounter: Payer: Self-pay | Admitting: Gastroenterology

## 2015-01-06 ENCOUNTER — Other Ambulatory Visit (INDEPENDENT_AMBULATORY_CARE_PROVIDER_SITE_OTHER): Payer: Medicare Other

## 2015-01-06 VITALS — BP 138/62 | HR 68 | Ht 58.75 in | Wt 126.1 lb

## 2015-01-06 DIAGNOSIS — R197 Diarrhea, unspecified: Secondary | ICD-10-CM

## 2015-01-06 DIAGNOSIS — K219 Gastro-esophageal reflux disease without esophagitis: Secondary | ICD-10-CM

## 2015-01-06 LAB — CBC WITH DIFFERENTIAL/PLATELET
BASOS PCT: 0.5 % (ref 0.0–3.0)
Basophils Absolute: 0 10*3/uL (ref 0.0–0.1)
EOS PCT: 2.7 % (ref 0.0–5.0)
Eosinophils Absolute: 0.2 10*3/uL (ref 0.0–0.7)
HCT: 40.9 % (ref 36.0–46.0)
HEMOGLOBIN: 13.7 g/dL (ref 12.0–15.0)
LYMPHS ABS: 1.6 10*3/uL (ref 0.7–4.0)
Lymphocytes Relative: 28.2 % (ref 12.0–46.0)
MCHC: 33.5 g/dL (ref 30.0–36.0)
MCV: 96.3 fl (ref 78.0–100.0)
MONO ABS: 0.6 10*3/uL (ref 0.1–1.0)
Monocytes Relative: 10.8 % (ref 3.0–12.0)
NEUTROS ABS: 3.3 10*3/uL (ref 1.4–7.7)
Neutrophils Relative %: 57.8 % (ref 43.0–77.0)
PLATELETS: 238 10*3/uL (ref 150.0–400.0)
RBC: 4.25 Mil/uL (ref 3.87–5.11)
RDW: 13.3 % (ref 11.5–15.5)
WBC: 5.6 10*3/uL (ref 4.0–10.5)

## 2015-01-06 LAB — COMPREHENSIVE METABOLIC PANEL
ALT: 21 U/L (ref 0–35)
AST: 21 U/L (ref 0–37)
Albumin: 4.1 g/dL (ref 3.5–5.2)
Alkaline Phosphatase: 55 U/L (ref 39–117)
BUN: 23 mg/dL (ref 6–23)
CALCIUM: 9.3 mg/dL (ref 8.4–10.5)
CHLORIDE: 105 meq/L (ref 96–112)
CO2: 27 meq/L (ref 19–32)
Creatinine, Ser: 0.7 mg/dL (ref 0.4–1.2)
GFR: 94.76 mL/min (ref >60.00)
Glucose, Bld: 91 mg/dL (ref 70–99)
Potassium: 3.2 mEq/L — ABNORMAL LOW (ref 3.5–5.1)
Sodium: 140 mEq/L (ref 135–145)
Total Bilirubin: 1 mg/dL (ref 0.2–1.2)
Total Protein: 7.1 g/dL (ref 6.0–8.3)

## 2015-01-06 LAB — C-REACTIVE PROTEIN

## 2015-01-06 MED ORDER — NA SULFATE-K SULFATE-MG SULF 17.5-3.13-1.6 GM/177ML PO SOLN: ORAL | Status: DC

## 2015-01-06 NOTE — Assessment & Plan Note (Signed)
Symptoms are fairly well-controlled with dietary discretion

## 2015-01-06 NOTE — Patient Instructions (Addendum)
You have been scheduled for a colonoscopy. Please follow written instructions given to you at your visit today.  Please pick up your prep kit at the pharmacy within the next 1-3 days. If you use inhalers (even only as needed), please bring them with you on the day of your procedure. Your physician has requested that you go to www.startemmi.com and enter the access code given to you at your visit today(SENT TO YOUR E-MAIL). This web site gives a general overview about your procedure. However, you should still follow specific instructions given to you by our office regarding your preparation for the procedure.  You have a follow up visit scheduled with Dr. Deatra Ina for 03-07-2015 at 8:45 am  Your physician has requested that you go to the basement for the following lab work before leaving today: CBC CRP CMET  Please purchase Benefiber over the counter and use as directed.

## 2015-01-06 NOTE — Assessment & Plan Note (Signed)
Long history of diarrhea with urgency and incontinence.  I suspect this is related to IBS.  There may be certain food triggers.  Microscopic colitis should be ruled out.  Recommendations #1 begin Benefiber daily #2 patient was instructed to take a careful food history when she is symptomatic #3 colonoscopy

## 2015-01-06 NOTE — Progress Notes (Signed)
_                                                                                                                History of Present Illness:  Regina Ramsey is a pleasant 74 year old white female referred for evaluation of diarrhea and incontinence.  For years she's had problems with intermittent diarrhea with severe urgency and incontinence.  This problem is becoming more frequent.  Approximate twice a week develops diarrhea with severe urgency and frequent incontinence.  She may have several loose stools and the sensation of tenesmus.  In between these episodes she has a normal solid stool.  There is no history of rectal bleeding.  She is unaware of any particular foods that cause this problem.  She tends to avoid dairy products.  Last colonoscopy was over 10 years ago.  Patient has a history of GERD.  If she overeats she'll have frequent regurgitation of gastric contents for hours and will take Protonix for a couple of weeks.   Past Medical History  Diagnosis Date  . MIGRAINE HEADACHE   . HEARING LOSS 07/13/2010  . PALPITATIONS, HX OF   . ALLERGIC RHINITIS   . ANEMIA-NOS   . HYPERTENSION   . HYPERLIPIDEMIA   . GERD   . OSTEOPOROSIS     on Prolia q 20mo, hx vertebral fx  . COPD     normal PFTs 06/22/11  . Anxiety   . Irritable bowel syndrome   . Asthma     Triggered by allergies  . Endometriosis   . Diverticulosis of colon   . Vaginal atrophy   . Dyspareunia    Past Surgical History  Procedure Laterality Date  . Breast surgery  1985    breast biopsy  . Appendectomy  1958  . Abdominal hysterectomy  1974   family history includes Arthritis in her father and mother; Bone cancer in her mother; Breast cancer in her maternal aunt and mother; Cancer in her mother; Diabetes in her other; Hyperlipidemia in her maternal grandmother. There is no history of Colon cancer or Colon polyps. Current Outpatient Prescriptions  Medication Sig Dispense Refill  . albuterol (PROAIR  HFA) 108 (90 BASE) MCG/ACT inhaler INHALE 2 PUFFS BY MOUTH EVERY 6 HOURS AS NEEDED FOR WHEEZING 8.5 g 0  . atorvastatin (LIPITOR) 20 MG tablet Take 20 mg by mouth daily.   3  . lidocaine (XYLOCAINE) 2 % jelly Apply vaginally PRN 30 mL 3  . losartan-hydrochlorothiazide (HYZAAR) 50-12.5 MG per tablet TAKE 1 TABLET BY MOUTH DAILY 30 tablet 5  . pantoprazole (PROTONIX) 40 MG tablet Take 1 tablet (40 mg total) by mouth daily. (Patient taking differently: Take 40 mg by mouth daily. Pt takes every 2 weeks as needed) 30 tablet 1  . traMADol (ULTRAM) 50 MG tablet TAKE 1 TABLET BY MOUTH EVERY 8 HOURS AS NEEDED FOR PAIN 30 tablet 0   No current facility-administered medications for this visit.   Allergies as of 01/06/2015  . (No Known Allergies)  reports that she quit smoking about 26 years ago. She has never used smokeless tobacco. She reports that she drinks alcohol. She reports that she does not use illicit drugs.   Review of Systems: Pertinent positive and negative review of systems were noted in the above HPI section. All other review of systems were otherwise negative.  Vital signs were reviewed in today's medical record Physical Exam: General: Well developed , well nourished, no acute distress Skin: anicteric Head: Normocephalic and atraumatic Eyes:  sclerae anicteric, EOMI Ears: Normal auditory acuity Mouth: No deformity or lesions Neck: Supple, no masses or thyromegaly Lungs: Clear throughout to auscultation Heart: Regular rate and rhythm; no murmurs, rubs or bruits Abdomen: Soft, non tender and non distended. No masses, hepatosplenomegaly or hernias noted. Normal Bowel sounds Rectal:deferred Musculoskeletal: Symmetrical with no gross deformities  Skin: No lesions on visible extremities Pulses:  Normal pulses noted Extremities: No clubbing, cyanosis, edema or deformities noted Neurological: Alert oriented x 4, grossly nonfocal Cervical Nodes:  No significant cervical  adenopathy Inguinal Nodes: No significant inguinal adenopathy Psychological:  Alert and cooperative. Normal mood and affect  See Assessment and Plan under Problem List

## 2015-01-27 ENCOUNTER — Encounter: Payer: Self-pay | Admitting: Gastroenterology

## 2015-01-29 ENCOUNTER — Other Ambulatory Visit: Payer: Self-pay | Admitting: Internal Medicine

## 2015-01-29 ENCOUNTER — Other Ambulatory Visit: Payer: Self-pay | Admitting: Family Medicine

## 2015-02-10 ENCOUNTER — Encounter: Payer: Self-pay | Admitting: Gastroenterology

## 2015-02-10 ENCOUNTER — Ambulatory Visit (AMBULATORY_SURGERY_CENTER): Payer: Medicare Other | Admitting: Gastroenterology

## 2015-02-10 VITALS — BP 112/54 | HR 74 | Temp 98.6°F | Resp 14 | Ht 58.75 in | Wt 126.0 lb

## 2015-02-10 DIAGNOSIS — R197 Diarrhea, unspecified: Secondary | ICD-10-CM

## 2015-02-10 DIAGNOSIS — D123 Benign neoplasm of transverse colon: Secondary | ICD-10-CM

## 2015-02-10 MED ORDER — SODIUM CHLORIDE 0.9 % IV SOLN
500.0000 mL | INTRAVENOUS | Status: DC
Start: 2015-02-10 — End: 2015-02-11

## 2015-02-10 NOTE — Progress Notes (Signed)
Report to PACU, RN, vss, BBS= Clear.  

## 2015-02-10 NOTE — Op Note (Signed)
[place]  [address]. [place], [address]   COLONOSCOPY PROCEDURE REPORT  PATIENT: Regina Ramsey, Regina Ramsey  MR#: [id number] BIRTHDATE: Oct 28, 1941 , 73  yrs. old GENDER: female ENDOSCOPIST: Inda Castle, MD REFERRED LE:[id number] Asa Lente, M.D. PROCEDURE DATE:  02/10/2015 PROCEDURE:   Colonoscopy with snare polypectomy and Colonoscopy with biopsy First Screening Colonoscopy - Avg.  risk and is 50 yrs.  old or older - No.  Prior Negative Screening - Now for repeat screening. 10 or more years since last screening  History of Adenoma - Now for follow-up colonoscopy & has been > or = to 3 yrs.  N/A  Polyps Removed Today? Yes. ASA CLASS:   Class II INDICATIONS:unexplained diarrhea. MEDICATIONS: Monitored anesthesia care and Propofol 200 mg IV  DESCRIPTION OF PROCEDURE:   After the risks benefits and alternatives of the procedure were thoroughly explained, informed consent was obtained.  The digital rectal exam revealed no abnormalities of the rectum.   The LB [id number]  endoscope was introduced through the anus and advanced to the ileum. No adverse events experienced.   The quality of the prep was excellent using Suprep  The instrument was then slowly withdrawn as the colon was fully examined.      COLON FINDINGS: A sessile polyp measuring 3 mm in size was found in the proximal transverse colon.  A polypectomy was performed with a cold snare.  The resection was complete, the polyp tissue was completely retrieved and sent to histology.   The examination was otherwise normal. Random biopsies were taken throughout the colon to rule out microscopic colitis. Retroflexion was not performed due to a narrow rectal vault. The time to cecum=8 minutes 17 seconds. Withdrawal time=6 minutes 31 seconds.  The scope was withdrawn and the procedure completed. COMPLICATIONS: There were no immediate complications.  ENDOSCOPIC IMPRESSION: 1.   Sessile polyp was found in  the proximal transverse colon; polypectomy was performed with a cold snare 2.   The examination was otherwise normal  RECOMMENDATIONS: await biopsy findings Fiber supplementation daily Office visit 3-4 weeks  eSigned:  Inda Castle, MD 02/10/2015 2:29 PM   cc:   PATIENT NAME:  Melessa, Cowell MR#: [id number]

## 2015-02-10 NOTE — Patient Instructions (Signed)
YOU HAD AN ENDOSCOPIC PROCEDURE TODAY AT THE [facility]: Refer to the procedure report that was given to you for any specific questions about what was found during the examination.  If the procedure report does not answer your questions, please call your gastroenterologist to clarify.  If you requested that your care partner not be given the details of your procedure findings, then the procedure report has been included in a sealed envelope for you to review at your convenience later.  YOU SHOULD EXPECT: Some feelings of bloating in the abdomen. Passage of more gas than usual.  Walking can help get rid of the air that was put into your GI tract during the procedure and reduce the bloating. If you had a lower endoscopy (such as a colonoscopy or flexible sigmoidoscopy) you may notice spotting of blood in your stool or on the toilet paper. If you underwent a bowel prep for your procedure, then you may not have a normal bowel movement for a few days.  DIET: Your first meal following the procedure should be a light meal and then it is ok to progress to your normal diet.  A half-sandwich or bowl of soup is an example of a good first meal.  Heavy or fried foods are harder to digest and may make you feel nauseous or bloated.  Likewise meals heavy in dairy and vegetables can cause extra gas to form and this can also increase the bloating.  Drink plenty of fluids but you should avoid alcoholic beverages for 24 hours.  ACTIVITY: Your care partner should take you home directly after the procedure.  You should plan to take it easy, moving slowly for the rest of the day.  You can resume normal activity the day after the procedure however you should NOT DRIVE or use heavy machinery for 24 hours (because of the sedation medicines used during the test).    SYMPTOMS TO REPORT IMMEDIATELY: A gastroenterologist can be reached at any hour.  During normal business hours, 8:30 AM to 5:00 PM Monday through Friday,  call [phone].  After hours and on weekends, please call the GI answering service at [phone] who will take a message and have the physician on call contact you.   Following lower endoscopy (colonoscopy or flexible sigmoidoscopy):  Excessive amounts of blood in the stool  Significant tenderness or worsening of abdominal pains  Swelling of the abdomen that is new, acute  Fever of 100F or higher    FOLLOW UP: If any biopsies were taken you will be contacted by phone or by letter within the next 1-3 weeks.  Call your gastroenterologist if you have not heard about the biopsies in 3 weeks.  Our staff will call the home number listed on your records the next business day following your procedure to check on you and address any questions or concerns that you may have at that time regarding the information given to you following your procedure. This is a courtesy call and so if there is no answer at the home number and we have not heard from you through the emergency physician on call, we will assume that you have returned to your regular daily activities without incident.  SIGNATURES/CONFIDENTIALITY: You and/or your care partner have signed paperwork which will be entered into your electronic medical record.  These signatures attest to the fact that that the information above on your After Visit Summary has been reviewed and is understood.  Full responsibility of the confidentiality   of this discharge information lies with you and/or your care-partner.     

## 2015-02-10 NOTE — Progress Notes (Signed)
Called to room to assist during endoscopic procedure.  Patient ID and intended procedure confirmed with present staff. Received instructions for my participation in the procedure from the performing physician.  

## 2015-02-11 ENCOUNTER — Telehealth: Payer: Self-pay | Admitting: *Deleted

## 2015-02-11 NOTE — Telephone Encounter (Signed)
  Follow up Call-  Call back number 02/10/2015  Post procedure Call Back phone  # [phone]  Permission to leave phone message Yes     Patient questions:  Do you have a fever, pain , or abdominal swelling? No. Pain Score  0 *  Have you tolerated food without any problems? Yes.    Have you been able to return to your normal activities? Yes.    Do you have any questions about your discharge instructions: Diet   No. Medications  No. Follow up visit  No.  Do you have questions or concerns about your Care? No.  Actions: * If pain score is 4 or above: No action needed, pain <4.

## 2015-02-17 ENCOUNTER — Other Ambulatory Visit: Payer: Self-pay

## 2015-02-17 MED ORDER — RIFAXIMIN 550 MG PO TABS: 550.0000 mg | ORAL_TABLET | Freq: Two times a day (BID) | ORAL | Status: DC

## 2015-02-18 NOTE — Progress Notes (Signed)
Lm 2/19

## 2015-02-27 ENCOUNTER — Encounter: Payer: Self-pay | Admitting: Gastroenterology

## 2015-02-28 ENCOUNTER — Telehealth: Payer: Self-pay | Admitting: Gastroenterology

## 2015-03-02 NOTE — Telephone Encounter (Signed)
Called pharmacy today. They stated that her co-pay was $100. They do not need a prior authorization

## 2015-03-07 ENCOUNTER — Encounter: Payer: Self-pay | Admitting: Gastroenterology

## 2015-03-07 ENCOUNTER — Ambulatory Visit (INDEPENDENT_AMBULATORY_CARE_PROVIDER_SITE_OTHER): Payer: Medicare Other | Admitting: Gastroenterology

## 2015-03-07 VITALS — BP 110/64 | HR 88 | Ht 58.75 in | Wt 124.6 lb

## 2015-03-07 DIAGNOSIS — R197 Diarrhea, unspecified: Secondary | ICD-10-CM

## 2015-03-07 DIAGNOSIS — Z86010 Personal history of colonic polyps: Secondary | ICD-10-CM | POA: Insufficient documentation

## 2015-03-07 NOTE — Telephone Encounter (Signed)
Patients diarrhea has subsided

## 2015-03-07 NOTE — Assessment & Plan Note (Signed)
Follow-up colonoscopy 5 years pending patient's overall health

## 2015-03-07 NOTE — Progress Notes (Signed)
      History of Present Illness:  Regina Ramsey has returned following colonoscopy.  Adenomatous polyps were removed.  Random biopsies were negative for microscopic colitis.  On a regimen of Benefiber and, more recently, xifaxan, diarrhea has subsided.  She attributes the improvement to Benefiber.  She also has identified several food triggers.    Review of Systems: Pertinent positive and negative review of systems were noted in the above HPI section. All other review of systems were otherwise negative.    Current Medications, Allergies, Past Medical History, Past Surgical History, Family History and Social History were reviewed in [facility] record  Vital signs were reviewed in today's medical record. Physical Exam: General: Well developed , well nourished, no acute distress Skin: anicteric   See Assessment and Plan under Problem List

## 2015-03-07 NOTE — Assessment & Plan Note (Signed)
Diarrhea is probably related to food triggers and IBS.  While she improved with xifaxan, she believes that primary response was to [employer].  Plan to continue with the latter.

## 2015-03-07 NOTE — Patient Instructions (Signed)
Follow up as needed

## 2015-04-05 LAB — HM MAMMOGRAPHY

## 2015-04-06 ENCOUNTER — Encounter: Payer: Self-pay | Admitting: Internal Medicine

## 2015-04-24 ENCOUNTER — Other Ambulatory Visit: Payer: Self-pay | Admitting: Internal Medicine

## 2015-06-27 ENCOUNTER — Other Ambulatory Visit: Payer: Self-pay

## 2015-07-25 ENCOUNTER — Encounter: Payer: Medicare Other | Admitting: Gynecology

## 2015-09-08 ENCOUNTER — Encounter: Payer: Self-pay | Admitting: Internal Medicine

## 2015-09-10 ENCOUNTER — Other Ambulatory Visit: Payer: Self-pay | Admitting: Internal Medicine

## 2015-09-13 ENCOUNTER — Other Ambulatory Visit: Payer: Self-pay | Admitting: Family Medicine

## 2015-09-14 NOTE — Telephone Encounter (Signed)
RF request for pantoprazole LOV: 12/04/14 Next ov: None Last written: 01/31/15 #30 w/ 0RF

## 2015-10-11 ENCOUNTER — Other Ambulatory Visit (INDEPENDENT_AMBULATORY_CARE_PROVIDER_SITE_OTHER): Payer: Medicare Other

## 2015-10-11 ENCOUNTER — Encounter: Payer: Self-pay | Admitting: Internal Medicine

## 2015-10-11 ENCOUNTER — Ambulatory Visit (INDEPENDENT_AMBULATORY_CARE_PROVIDER_SITE_OTHER): Payer: Medicare Other | Admitting: Internal Medicine

## 2015-10-11 VITALS — BP 130/64 | HR 78 | Temp 97.9°F | Resp 16 | Ht 59.0 in | Wt 125.0 lb

## 2015-10-11 DIAGNOSIS — Z23 Encounter for immunization: Secondary | ICD-10-CM

## 2015-10-11 DIAGNOSIS — K219 Gastro-esophageal reflux disease without esophagitis: Secondary | ICD-10-CM

## 2015-10-11 DIAGNOSIS — Z0000 Encounter for general adult medical examination without abnormal findings: Secondary | ICD-10-CM

## 2015-10-11 DIAGNOSIS — K591 Functional diarrhea: Secondary | ICD-10-CM

## 2015-10-11 DIAGNOSIS — M810 Age-related osteoporosis without current pathological fracture: Secondary | ICD-10-CM | POA: Diagnosis not present

## 2015-10-11 DIAGNOSIS — E785 Hyperlipidemia, unspecified: Secondary | ICD-10-CM

## 2015-10-11 DIAGNOSIS — I10 Essential (primary) hypertension: Secondary | ICD-10-CM | POA: Diagnosis not present

## 2015-10-11 DIAGNOSIS — J302 Other seasonal allergic rhinitis: Secondary | ICD-10-CM

## 2015-10-11 DIAGNOSIS — M542 Cervicalgia: Secondary | ICD-10-CM | POA: Diagnosis not present

## 2015-10-11 DIAGNOSIS — Z79899 Other long term (current) drug therapy: Secondary | ICD-10-CM

## 2015-10-11 LAB — COMPREHENSIVE METABOLIC PANEL
ALK PHOS: 68 U/L (ref 39–117)
ALT: 17 U/L (ref 0–35)
AST: 22 U/L (ref 0–37)
Albumin: 4.2 g/dL (ref 3.5–5.2)
BILIRUBIN TOTAL: 0.8 mg/dL (ref 0.2–1.2)
BUN: 17 mg/dL (ref 6–23)
CO2: 25 mEq/L (ref 19–32)
Calcium: 9.7 mg/dL (ref 8.4–10.5)
Chloride: 104 mEq/L (ref 96–112)
Creatinine, Ser: 0.93 mg/dL (ref 0.40–1.20)
GFR: 62.55 mL/min (ref >60.00)
GLUCOSE: 102 mg/dL — AB (ref 70–99)
POTASSIUM: 3.5 meq/L (ref 3.5–5.1)
SODIUM: 146 meq/L — AB (ref 135–145)
TOTAL PROTEIN: 7.2 g/dL (ref 6.0–8.3)

## 2015-10-11 LAB — CBC WITH DIFFERENTIAL/PLATELET
BASOS ABS: 0 10*3/uL (ref 0.0–0.1)
Basophils Relative: 0.7 % (ref 0.0–3.0)
Eosinophils Absolute: 0.2 10*3/uL (ref 0.0–0.7)
Eosinophils Relative: 2.9 % (ref 0.0–5.0)
HCT: 40.2 % (ref 36.0–46.0)
Hemoglobin: 13.6 g/dL (ref 12.0–15.0)
LYMPHS ABS: 1.2 10*3/uL (ref 0.7–4.0)
Lymphocytes Relative: 20.1 % (ref 12.0–46.0)
MCHC: 33.9 g/dL (ref 30.0–36.0)
MCV: 95.4 fl (ref 78.0–100.0)
MONO ABS: 0.6 10*3/uL (ref 0.1–1.0)
Monocytes Relative: 10.5 % (ref 3.0–12.0)
NEUTROS PCT: 65.8 % (ref 43.0–77.0)
Neutro Abs: 4 10*3/uL (ref 1.4–7.7)
Platelets: 226 10*3/uL (ref 150.0–400.0)
RBC: 4.22 Mil/uL (ref 3.87–5.11)
RDW: 13.1 % (ref 11.5–15.5)
WBC: 6 10*3/uL (ref 4.0–10.5)

## 2015-10-11 LAB — TSH: TSH: 3.52 u[IU]/mL (ref 0.35–4.50)

## 2015-10-11 LAB — LIPID PANEL
Cholesterol: 181 mg/dL (ref 0–200)
HDL: 43.9 mg/dL (ref >39.00)
LDL Cholesterol: 114 mg/dL — ABNORMAL HIGH (ref 0–99)
NONHDL: 136.73
Total CHOL/HDL Ratio: 4
Triglycerides: 116 mg/dL (ref 0.0–149.0)
VLDL: 23.2 mg/dL (ref 0.0–40.0)

## 2015-10-11 LAB — VITAMIN B12: VITAMIN B 12: 217 pg/mL (ref 211–911)

## 2015-10-11 LAB — MAGNESIUM: MAGNESIUM: 1.7 mg/dL (ref 1.5–2.5)

## 2015-10-11 MED ORDER — FEXOFENADINE HCL 60 MG PO TABS: 60.0000 mg | ORAL_TABLET | Freq: Two times a day (BID) | ORAL | Status: DC

## 2015-10-11 NOTE — Progress Notes (Signed)
Pre visit review using our clinic review tool, if applicable. No additional management support is needed unless otherwise documented below in the visit note. 

## 2015-10-11 NOTE — Assessment & Plan Note (Signed)
Continue fexofenadine daily Use inhaler as needed for wheezing related to allergies

## 2015-10-11 NOTE — Assessment & Plan Note (Addendum)
Due for dexa -- ordered Needs to start vitamin D, will check level today Encouraged regular exercise Discussed getting off pantoprazole

## 2015-10-11 NOTE — Assessment & Plan Note (Signed)
On statin, check lipid panel, cmp today Encouraged to start regular exercise

## 2015-10-11 NOTE — Progress Notes (Signed)
Subjective:    Patient ID: Regina Ramsey, female    DOB: September 23, 1941, 74 y.o.   MRN: [id number]  HPI She is here to establish with a new pcp.  She is concerned about her bowel habits.    Diarrhea:  2-3 times a week she has urgency and diarrhea.  She has abdominal cramping before and it is resolved with having a bowel movement.  Her symptoms started over a year ago while in [place].  They did improve with benefiber.  Her symptoms may be related to salad dressings, rich sauces.  She still has the symptoms and there is some relation to stress.  She had a colonoscopy in the beginning of the year and it as normal.  She had a polyp removed.  She takes the fiber some days.    Hypertension: She is taking her medication daily. She is compliant with a low sodium diet.  She denies chest pain, palpitations, edema, shortness of breath and regular headaches. She is active, but not exercising regularly.  She does not monitor her blood pressure at home.    Hyperlipidemia: She is taking her medication daily. She is compliant with a low fat/cholesterol diet. She is not exercising regularly. She denies myalgias.   GERD:  She takes the pantoprazole daily.  If she does not take the medication she will have symptoms.  She is not compliant with a GERD diet.   Back pain:  She has chronic low grade back pain.  She takes 1/2 of a tramadol if she needs to be standing for long periods of time.  She does not take it often.    Allergies:  She has seasonal allergies and takes otc allegra daily.  She has wheezing if she does not take the allergy medication.   She rarely uses her inhaler.   Neck pain:  She has chronic neck pain from arthritis.  She has numbness/tingling in her hands when she sleeps at night only.  She does not take anything for the pain.  Tramadol, advil and tylenol does not help.  She saw ortho years ago and would like to see them again.   Medications and allergies reviewed with patient and updated if  appropriate.  Patient Active Problem List   Diagnosis Date Noted  . History of colonic polyps 03/07/2015  . Diarrhea 01/06/2015  . Vaginal atrophy 07/23/2014  . Dyspareunia 07/23/2014  . Hx of endometriosis 06/30/2014  . Diverticulosis of colon 06/30/2014  . Osteoporosis 08/16/2010  . Hyperlipidemia 07/13/2010  . ANEMIA-NOS 07/13/2010  . MIGRAINE HEADACHE 07/13/2010  . HEARING LOSS 07/13/2010  . Essential hypertension 07/13/2010  . Allergic rhinitis 07/13/2010  . ASTHMA 07/13/2010  . GERD 07/13/2010  . PALPITATIONS, HX OF 07/13/2010    Past Medical History  Diagnosis Date  . MIGRAINE HEADACHE   . HEARING LOSS 07/13/2010  . PALPITATIONS, HX OF   . ALLERGIC RHINITIS   . ANEMIA-NOS   . HYPERTENSION   . HYPERLIPIDEMIA   . GERD   . OSTEOPOROSIS     on Prolia q 31mo, hx vertebral fx  . COPD     normal PFTs 06/22/11  . Anxiety   . Irritable bowel syndrome   . Asthma     Triggered by allergies  . Endometriosis   . Diverticulosis of colon   . Vaginal atrophy   . Dyspareunia     Past Surgical History  Procedure Laterality Date  . Breast surgery  1985    breast biopsy  .  Appendectomy  1958  . Abdominal hysterectomy  1974    Social History   Social History  . Marital Status: Married    Spouse Name: N/A  . Number of Children: 2  . Years of Education: N/A   Occupational History  . Retired    Social History Main Topics  . Smoking status: Former Smoker    Quit date: 12/31/1988  . Smokeless tobacco: Never Used     Comment: Married, lives with souse. 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 Alcohol Use: 0.0 oz/week    0 Standard drinks or equivalent per week     Comment: Social  . Drug Use: No  . Sexual Activity: Not Asked   Other Topics Concern  . None   Social History Narrative   Retired Pharmacist, hospital      No regular exercise, active    Review of Systems  Constitutional: Negative for fever, chills and fatigue.  Respiratory: Positive for  cough (dry cough, chronic) and wheezing (occasional with allergies). Negative for shortness of breath.   Cardiovascular: Positive for palpitations (with stress only). Negative for chest pain and leg swelling.  Gastrointestinal: Positive for abdominal pain (just before diarrhea).  Genitourinary: Negative for dysuria.  Musculoskeletal: Positive for arthralgias (neck pain and stiffness, tingling/numbness in hands at night).  Neurological: Positive for dizziness (occasional). Negative for light-headedness and headaches.  Psychiatric/Behavioral: Negative for dysphoric mood. The patient is nervous/anxious.        Objective:   Filed Vitals:   10/11/15 0818  BP: 130/64  Pulse: 78  Temp: 97.9 F (36.6 C)  Resp: 16   Filed Weights   10/11/15 0818  Weight: 125 lb (56.7 kg)   Body mass index is 25.23 kg/(m^2).   Physical Exam  Constitutional: She appears well-developed and well-nourished. No distress.  HENT:  Head: Normocephalic and atraumatic.  Eyes: Conjunctivae are normal.  Neck: Neck supple. No tracheal deviation present. No thyromegaly present.  Decreased ROM from arthritis, no carotid bruit  Cardiovascular: Normal rate, regular rhythm and normal heart sounds.   No murmur heard. Pulmonary/Chest: Effort normal and breath sounds normal. No respiratory distress. She has no wheezes.  Abdominal: Soft. She exhibits no distension and no mass. There is no tenderness.  Musculoskeletal: She exhibits no edema.  Lymphadenopathy:    She has no cervical adenopathy.  Psychiatric: She has a normal mood and affect. Her behavior is normal.        Assessment & Plan:    See Problem List.  Pneumonia vaccine today  Follow up in 6 months for PE

## 2015-10-11 NOTE — Patient Instructions (Addendum)
  We have reviewed your prior records including labs and tests today.  Test(s) ordered today. Your results will be released to [facility] (or called to you) after review, usually within 72hours after test completion. If any changes need to be made, you will be notified at that same time.  All other Health Maintenance issues reviewed.   All recommended immunizations and age-appropriate screenings are up-to-date.  pneumonia vaccine administered today.   Medications reviewed and updated.  Start taking the benefiber regularly.  You should start taking vitamin D regularly as well.  I will be able to give you an idea of how much you should be taking after we get your blood results.   Your prescription(s) have been submitted to your pharmacy. Please take as directed and contact our office if you believe you are having problem(s) with the medication(s).  A referral for orthopedics has been ordered.   A bone density was ordered.  Please schedule followup in 6 months for a physical.

## 2015-10-11 NOTE — Assessment & Plan Note (Signed)
Colonoscopy earlier this year normal, except for polyp removed Likely related to IBS - stress and certain foods trigger symptoms Restart benefiber daily -- this has helped in past

## 2015-10-11 NOTE — Assessment & Plan Note (Signed)
Symptoms controlled with pantoprazole - symptomatic when she does not take the medication Reviewed gerd diet/lifestyle Discussed potential consequences of taking pantoprazole long term

## 2015-10-11 NOTE — Assessment & Plan Note (Signed)
bp at goal and well controlled Continue current medication at current dose

## 2015-10-14 LAB — VITAMIN D 1,25 DIHYDROXY
VITAMIN D 1, 25 (OH) TOTAL: 63 pg/mL (ref 18–72)
Vitamin D2 1, 25 (OH)2: <8 pg/mL
Vitamin D3 1, 25 (OH)2: 63 pg/mL

## 2015-10-16 ENCOUNTER — Encounter: Payer: Self-pay | Admitting: Internal Medicine

## 2015-11-02 ENCOUNTER — Telehealth: Payer: Self-pay | Admitting: Emergency Medicine

## 2015-11-02 NOTE — Telephone Encounter (Signed)
-----   Message from Binnie Rail, MD sent at 11/02/2015  8:51 AM EDT ----- I don't think he/she read their mychart message. Can you please call them or mail them a copy. Thanks.

## 2015-11-02 NOTE — Telephone Encounter (Signed)
Results have been mailed to pt

## 2015-12-19 ENCOUNTER — Encounter: Payer: Self-pay | Admitting: Internal Medicine

## 2015-12-19 ENCOUNTER — Ambulatory Visit (INDEPENDENT_AMBULATORY_CARE_PROVIDER_SITE_OTHER): Payer: Medicare Other | Admitting: Internal Medicine

## 2015-12-19 VITALS — BP 118/58 | HR 85 | Temp 97.7°F | Resp 14 | Ht <= 58 in | Wt 124.0 lb

## 2015-12-19 DIAGNOSIS — J302 Other seasonal allergic rhinitis: Secondary | ICD-10-CM | POA: Diagnosis not present

## 2015-12-19 MED ORDER — BENZONATATE 200 MG PO CAPS: 200.0000 mg | ORAL_CAPSULE | Freq: Three times a day (TID) | ORAL | Status: DC | PRN

## 2015-12-19 NOTE — Progress Notes (Signed)
   Subjective:    Patient ID: Regina Ramsey, female    DOB: [birth year], 74 y.o.   MRN: [id number]  HPI The patient is a 74 YO female coming in for cough. She tends to get allergies this time of year. Cough bad for 1 week, overall improving. She is taking allegra and has some flonase at home she is not using. Her stomach muscles are getting sore from coughing so much. No problems sleeping at night. No fevers or chills. No jaw or facial tenderness. No ear pain or discharge. Clear drainage. Non-productive cough.   Review of Systems  Constitutional: Negative for fever, chills, activity change, appetite change, fatigue and unexpected weight change.  HENT: Positive for congestion, postnasal drip and rhinorrhea. Negative for dental problem, ear discharge, ear pain, sinus pressure, sore throat and trouble swallowing.   Eyes: Negative.   Respiratory: Positive for cough. Negative for chest tightness, shortness of breath and wheezing.   Cardiovascular: Negative for chest pain, palpitations and leg swelling.  Gastrointestinal: Negative.   Musculoskeletal: Negative.   Neurological: Negative.       Objective:   Physical Exam  Constitutional: She is oriented to person, place, and time. She appears well-developed and well-nourished.  HENT:  Head: Normocephalic and atraumatic.  Right Ear: External ear normal.  Left Ear: External ear normal.  Oropharynx red with mild clear drainage. No CAD  Eyes: EOM are normal.  Neck: Normal range of motion.  Cardiovascular: Normal rate and regular rhythm.   Pulmonary/Chest: Effort normal and breath sounds normal. No respiratory distress. She has no wheezes. She has no rales.  Abdominal: Soft. She exhibits no distension. There is no tenderness. There is no rebound.  Neurological: She is alert and oriented to person, place, and time. Coordination normal.  Skin: Skin is warm and dry.   Filed Vitals:   12/19/15 1347  BP: 118/58  Pulse: 85  Temp: 97.7 F (36.5  C)  TempSrc: Oral  Resp: 14  Height: 4\' 10"  (1.473 m)  Weight: 124 lb (56.246 kg)  SpO2: 95%      Assessment & Plan:

## 2015-12-19 NOTE — Patient Instructions (Signed)
We have sent in the cough medicine called tessalon perles. You can take them up to 3 times per day to reduce the amount of coughing.  Think about starting the flonase to help more.

## 2015-12-19 NOTE — Assessment & Plan Note (Signed)
She was encouraged to take flonase and allegra for her symptoms. No indication for antibiotics at this time. Rx for tessalon perles today to help with her symptoms.

## 2015-12-19 NOTE — Progress Notes (Signed)
Pre visit review using our clinic review tool, if applicable. No additional management support is needed unless otherwise documented below in the visit note. 

## 2016-02-16 ENCOUNTER — Ambulatory Visit (INDEPENDENT_AMBULATORY_CARE_PROVIDER_SITE_OTHER)
Admission: RE | Admit: 2016-02-16 | Discharge: 2016-02-16 | Disposition: A | Discharge status: Home / Self Care | Payer: Medicare Other | Source: Ambulatory Visit | Attending: Internal Medicine | Admitting: Internal Medicine

## 2016-02-16 DIAGNOSIS — Z13820 Encounter for screening for osteoporosis: Secondary | ICD-10-CM | POA: Diagnosis not present

## 2016-02-16 DIAGNOSIS — Z0000 Encounter for general adult medical examination without abnormal findings: Secondary | ICD-10-CM

## 2016-02-18 ENCOUNTER — Encounter: Payer: Self-pay | Admitting: Internal Medicine

## 2016-04-11 ENCOUNTER — Encounter: Payer: Self-pay | Admitting: Internal Medicine

## 2016-04-11 ENCOUNTER — Ambulatory Visit (INDEPENDENT_AMBULATORY_CARE_PROVIDER_SITE_OTHER): Payer: Medicare Other | Admitting: Internal Medicine

## 2016-04-11 VITALS — BP 144/80 | HR 93 | Temp 98.1°F | Resp 16 | Wt 125.0 lb

## 2016-04-11 DIAGNOSIS — M810 Age-related osteoporosis without current pathological fracture: Secondary | ICD-10-CM | POA: Diagnosis not present

## 2016-04-11 DIAGNOSIS — E785 Hyperlipidemia, unspecified: Secondary | ICD-10-CM

## 2016-04-11 DIAGNOSIS — K219 Gastro-esophageal reflux disease without esophagitis: Secondary | ICD-10-CM

## 2016-04-11 DIAGNOSIS — R739 Hyperglycemia, unspecified: Secondary | ICD-10-CM

## 2016-04-11 DIAGNOSIS — I10 Essential (primary) hypertension: Secondary | ICD-10-CM | POA: Diagnosis not present

## 2016-04-11 DIAGNOSIS — R0609 Other forms of dyspnea: Secondary | ICD-10-CM

## 2016-04-11 DIAGNOSIS — R002 Palpitations: Secondary | ICD-10-CM | POA: Insufficient documentation

## 2016-04-11 MED ORDER — LOSARTAN POTASSIUM-HCTZ 50-12.5 MG PO TABS
1.0000 | ORAL_TABLET | Freq: Every day | ORAL | Status: DC
Start: 2016-04-11 — End: 2017-04-27

## 2016-04-11 MED ORDER — ALBUTEROL SULFATE HFA 108 (90 BASE) MCG/ACT IN AERS: INHALATION_SPRAY | RESPIRATORY_TRACT | Status: DC

## 2016-04-11 MED ORDER — ATORVASTATIN CALCIUM 20 MG PO TABS: 20.0000 mg | ORAL_TABLET | Freq: Every day | ORAL | Status: DC

## 2016-04-11 NOTE — Patient Instructions (Signed)
We will contact you to schedule a stress test.  We will contact you regarding the prolia injections.   Test(s) ordered today. Your results will be released to [facility] (or called to you) after review, usually within 72hours after test completion. If any changes need to be made, you will be notified at that same time.   Medications reviewed and updated.  No changes recommended at this time.  Your prescription(s) have been submitted to your pharmacy. Please take as directed and contact our office if you believe you are having problem(s) with the medication(s).   Please followup in 6 months

## 2016-04-11 NOTE — Assessment & Plan Note (Addendum)
Recent bone density shows osteoporosis She is currently not taking calcium or vitamin D She is currently not exercising She had been on Prolia in the past and would consider restarting this as long as the process is easier than it was in the past to get the injection-we will look into this Stressed regular exercise Advised calcium and vitamin D daily

## 2016-04-11 NOTE — Progress Notes (Signed)
Subjective:    Patient ID: Regina Ramsey, female    DOB: [birth year], 75 y.o.   MRN: [id number]  HPI She is here for follow up.  Hypertension: She is taking her medication daily. She is compliant with a low sodium diet.  She denies chest pain, edema, Lightheadedness and regular headaches. She is not exercising regularly.  She does monitor her blood pressure at home and is about the same as it is here today.    GERD:  She is taking her medication as needed only.  She denies daily GERD symptoms. She does not always feel that the pantoprazole is fast acting.   Hyperlipidemia: She is taking her medication daily. She is compliant with a low fat/cholesterol diet. She is not exercising regularly. She denies myalgias.   Osteoporosis:  She is not exercising.  She is not taking calcium and vitamin D.  She last took the prolia a couple of years ago.  She would do prolia again if it was an easier process to do.   When she goes upstairs her heart rate increases more than it used to. She noticed this in the past 3-4 months.  She sometimes gets sob with stairs or at rest.  She denies any chest pain.  She takes tramadol as needed back pain or for stage fright/nervousness prior to racing cars or singing in church.  She was only taking the tramadol for back pain, noticed that it helped in these other situations as well. She was wondering if she had an adrenal gland problem.  Medications and allergies reviewed with patient and updated if appropriate.  Patient Active Problem List   Diagnosis Date Noted  . History of colonic polyps 03/07/2015  . Diarrhea 01/06/2015  . Vaginal atrophy 07/23/2014  . Dyspareunia 07/23/2014  . Hx of endometriosis 06/30/2014  . Diverticulosis of colon 06/30/2014  . Osteoporosis 08/16/2010  . Hyperlipidemia 07/13/2010  . ANEMIA-NOS 07/13/2010  . MIGRAINE HEADACHE 07/13/2010  . HEARING LOSS 07/13/2010  . Essential hypertension 07/13/2010  . Allergic rhinitis 07/13/2010   . ASTHMA 07/13/2010  . GERD 07/13/2010  . PALPITATIONS, HX OF 07/13/2010    Current Outpatient Prescriptions on File Prior to Visit  Medication Sig Dispense Refill  . albuterol (PROAIR HFA) 108 (90 BASE) MCG/ACT inhaler INHALE 2 PUFFS BY MOUTH EVERY 6 HOURS AS NEEDED FOR WHEEZING 8.5 g 0  . atorvastatin (LIPITOR) 20 MG tablet Take 1 tablet (20 mg total) by mouth daily. 90 tablet 1  . benzonatate (TESSALON) 200 MG capsule Take 1 capsule (200 mg total) by mouth 3 (three) times daily as needed for cough. 60 capsule 0  . fexofenadine (ALLEGRA ALLERGY) 60 MG tablet Take 1 tablet (60 mg total) by mouth 2 (two) times daily. 30 tablet 12  . losartan-hydrochlorothiazide (HYZAAR) 50-12.5 MG per tablet Take 1 tablet by mouth daily. 90 tablet 3  . pantoprazole (PROTONIX) 40 MG tablet TAKE 1 TABLET BY MOUTH DAILY 30 tablet 3  . traMADol (ULTRAM) 50 MG tablet TAKE 1 TABLET BY MOUTH EVERY 8 HOURS AS NEEDED FOR PAIN (Patient taking differently: TAKE 1/2 TABLET BY MOUTH EVERY 8 HOURS AS NEEDED FOR PAIN) 30 tablet 0   No current facility-administered medications on file prior to visit.    Past Medical History  Diagnosis Date  . MIGRAINE HEADACHE   . HEARING LOSS 07/13/2010  . PALPITATIONS, HX OF   . ALLERGIC RHINITIS   . ANEMIA-NOS   . HYPERTENSION   . HYPERLIPIDEMIA   .  GERD   . OSTEOPOROSIS     on Prolia q 30mo hx vertebral fx  . COPD     normal PFTs 06/22/11  . Anxiety   . Irritable bowel syndrome   . Asthma     Triggered by allergies  . Endometriosis   . Diverticulosis of colon   . Vaginal atrophy   . Dyspareunia     Past Surgical History  Procedure Laterality Date  . Breast surgery  1985    breast biopsy  . Appendectomy  1958  . Abdominal hysterectomy  1974    Social History   Social History  . Marital Status: Married    Spouse Name: N/A  . Number of Children: 2  . Years of Education: N/A   Occupational History  . Retired    Social History Main Topics  . Smoking  status: Former Smoker    Quit date: 12/31/1988  . Smokeless tobacco: Never Used     Comment: Married, lives with souse. 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 Alcohol Use: 0.0 oz/week    0 Standard drinks or equivalent per week     Comment: Social  . Drug Use: No  . Sexual Activity: Not on file   Other Topics Concern  . Not on file   Social History Narrative   Retired tPharmacist, hospital     No regular exercise, active    Family History  Problem Relation Age of Onset  . Arthritis Mother   . Breast cancer Mother   . Cancer Mother     started on her spine met cancer  . Arthritis Father   . Breast cancer Maternal Aunt   . Diabetes Other     parent & grandparent  . Hyperlipidemia Maternal Grandmother   . Bone cancer Mother   . Colon cancer Neg Hx   . Colon polyps Neg Hx     Review of Systems  HENT: Positive for congestion and hearing loss.   Respiratory: Positive for cough (with allergies) and shortness of breath (with stairs, occ when laying in bed). Negative for wheezing.   Cardiovascular: Positive for palpitations (going up stairs). Negative for chest pain and leg swelling.  Neurological: Negative for light-headedness and headaches.       Objective:   Filed Vitals:   04/11/16 1334  BP: 144/80  Pulse: 93  Temp: 98.1 F (36.7 C)  Resp: 16   Filed Weights   04/11/16 1334  Weight: 125 lb (56.7 kg)   Body mass index is 26.13 kg/(m^2).   Physical Exam Constitutional: Appears well-developed and well-nourished. No distress.  Neck: Neck supple. No tracheal deviation present. No thyromegaly present.  No carotid bruit. No cervical adenopathy.   Cardiovascular: Normal rate, regular rhythm and normal heart sounds.   No murmur heard.  No edema Pulmonary/Chest: Effort normal and breath sounds normal. No respiratory distress. No wheezes.        Assessment & Plan:    See Problem List for Assessment and Plan of chronic medical problems.   Follow-up in 6  months

## 2016-04-11 NOTE — Assessment & Plan Note (Signed)
Lipid panel has been controlled Continue Lipitor 20 mg daily

## 2016-04-11 NOTE — Progress Notes (Signed)
Pre visit review using our clinic review tool, if applicable. No additional management support is needed unless otherwise documented below in the visit note. 

## 2016-04-11 NOTE — Assessment & Plan Note (Addendum)
BP goal < 150/90 given age and lack of DM or CKD Continue monitor blood pressure at home Continue current medications at current doses

## 2016-04-11 NOTE — Assessment & Plan Note (Addendum)
GERD controlled Continue protonix as needed as a preventative medication Can take Zantac, Pepcid or an antacid as needed if her heartburn is occasional, which will likely work more quickly

## 2016-04-11 NOTE — Assessment & Plan Note (Signed)
The past 75-year-old 4 months she has been experiencing increased palpitations with going upstairs without change in her lifestyle. Denies chest pain. Does have some shortness of breath at times with stairs or at rest We'll order a stress test to rule out cardiac ischemia May be deconditioning since she is not exercising regularly

## 2016-04-11 NOTE — Assessment & Plan Note (Signed)
Check A1c. 

## 2016-04-12 ENCOUNTER — Other Ambulatory Visit (INDEPENDENT_AMBULATORY_CARE_PROVIDER_SITE_OTHER): Payer: Medicare Other

## 2016-04-12 DIAGNOSIS — R739 Hyperglycemia, unspecified: Secondary | ICD-10-CM

## 2016-04-12 DIAGNOSIS — I10 Essential (primary) hypertension: Secondary | ICD-10-CM | POA: Diagnosis not present

## 2016-04-12 LAB — COMPREHENSIVE METABOLIC PANEL
ALT: 22 U/L (ref 0–35)
AST: 24 U/L (ref 0–37)
Albumin: 3.8 g/dL (ref 3.5–5.2)
Alkaline Phosphatase: 63 U/L (ref 39–117)
BUN: 20 mg/dL (ref 6–23)
CO2: 35 meq/L — AB (ref 19–32)
Calcium: 9.5 mg/dL (ref 8.4–10.5)
Chloride: 102 mEq/L (ref 96–112)
Creatinine, Ser: 0.74 mg/dL (ref 0.40–1.20)
GFR: 81.31 mL/min (ref >60.00)
GLUCOSE: 90 mg/dL (ref 70–99)
POTASSIUM: 3.3 meq/L — AB (ref 3.5–5.1)
SODIUM: 142 meq/L (ref 135–145)
TOTAL PROTEIN: 7.3 g/dL (ref 6.0–8.3)
Total Bilirubin: 0.6 mg/dL (ref 0.2–1.2)

## 2016-04-12 LAB — HEMOGLOBIN A1C: Hgb A1c MFr Bld: 6.2 % (ref 4.6–6.5)

## 2016-04-15 ENCOUNTER — Other Ambulatory Visit: Payer: Self-pay | Admitting: Internal Medicine

## 2016-04-15 DIAGNOSIS — R7303 Prediabetes: Secondary | ICD-10-CM | POA: Insufficient documentation

## 2016-04-15 MED ORDER — POTASSIUM CHLORIDE CRYS ER 20 MEQ PO TBCR: 20.0000 meq | EXTENDED_RELEASE_TABLET | Freq: Every day | ORAL | Status: DC

## 2016-04-16 ENCOUNTER — Telehealth: Payer: Self-pay

## 2016-04-16 NOTE — Telephone Encounter (Signed)
Patient is being submitted into prolia portal to see insurance requirements for prolia----patient will also be advised to being calcium, vit d, and low impact exercise

## 2016-04-16 NOTE — Telephone Encounter (Signed)
-----   Message from Binnie Rail, MD sent at 04/11/2016  7:55 PM EDT ----- She is interested in Prolia.  DEXA 02/16/2016: RFN - 3.2, LFN -2.9, lumbar -2.6  She was on medication in the past, but the process of getting the injection was difficult-that is really concerned.  Isn't improved please stressed to her that she needs to take calcium and vitamin D daily as well as exercise-currently doing neither.

## 2016-04-25 ENCOUNTER — Telehealth: Payer: Self-pay | Admitting: Internal Medicine

## 2016-04-25 MED ORDER — POTASSIUM CHLORIDE 20 MEQ/15ML (10%) PO SOLN: 20.0000 meq | Freq: Every day | ORAL | Status: DC

## 2016-04-25 NOTE — Telephone Encounter (Signed)
Patient is requesting liquid form of potassium to be sent [facility] at [address] b/c she is having difficulty swallowing the pill form.

## 2016-04-25 NOTE — Telephone Encounter (Signed)
LVM informing pt

## 2016-04-25 NOTE — Telephone Encounter (Signed)
20meq.daily  

## 2016-04-25 NOTE — Telephone Encounter (Signed)
PLease advise on the dosage and I will send to [facility]

## 2016-05-01 ENCOUNTER — Encounter (HOSPITAL_COMMUNITY): Payer: Self-pay | Admitting: Radiology

## 2016-05-01 ENCOUNTER — Ambulatory Visit (HOSPITAL_COMMUNITY): Payer: Medicare Other | Attending: Internal Medicine

## 2016-05-01 ENCOUNTER — Encounter (HOSPITAL_COMMUNITY): Payer: Self-pay | Admitting: *Deleted

## 2016-05-01 ENCOUNTER — Ambulatory Visit (HOSPITAL_COMMUNITY): Payer: Medicare Other

## 2016-05-01 DIAGNOSIS — R0609 Other forms of dyspnea: Secondary | ICD-10-CM

## 2016-05-01 DIAGNOSIS — R002 Palpitations: Secondary | ICD-10-CM

## 2016-05-01 NOTE — Progress Notes (Signed)
Patient arrived for stress echocardiogram. During the resting EKG, the CCT noted the patient had LBBB. Per protocol LBBB is a contraindication for stress echocardiograms and the exam was cancelled. Dr. [name] will be notified of exam cancellation. Suggest Lexiscan, if clinically indicated.

## 2016-05-02 ENCOUNTER — Telehealth: Payer: Self-pay | Admitting: Internal Medicine

## 2016-05-02 DIAGNOSIS — R002 Palpitations: Secondary | ICD-10-CM

## 2016-05-02 DIAGNOSIS — R0609 Other forms of dyspnea: Principal | ICD-10-CM

## 2016-05-02 NOTE — Telephone Encounter (Signed)
Let her know I have referred her to see cardiology to see about having the stress test. Order entered.

## 2016-05-03 NOTE — Telephone Encounter (Signed)
LVM informing pt

## 2016-05-22 ENCOUNTER — Ambulatory Visit (INDEPENDENT_AMBULATORY_CARE_PROVIDER_SITE_OTHER): Payer: Medicare Other

## 2016-05-22 DIAGNOSIS — M810 Age-related osteoporosis without current pathological fracture: Secondary | ICD-10-CM | POA: Diagnosis not present

## 2016-05-22 MED ORDER — DENOSUMAB 60 MG/ML ~~LOC~~ SOLN
60.0000 mg | Freq: Once | SUBCUTANEOUS | Status: AC
  Administered 2016-05-22: 60 mg via SUBCUTANEOUS

## 2016-05-30 ENCOUNTER — Ambulatory Visit (INDEPENDENT_AMBULATORY_CARE_PROVIDER_SITE_OTHER): Payer: Medicare Other | Admitting: Internal Medicine

## 2016-05-30 ENCOUNTER — Encounter: Payer: Self-pay | Admitting: Internal Medicine

## 2016-05-30 VITALS — BP 118/60 | HR 91 | Ht <= 58 in | Wt 126.0 lb

## 2016-05-30 DIAGNOSIS — R0600 Dyspnea, unspecified: Secondary | ICD-10-CM | POA: Diagnosis not present

## 2016-05-30 DIAGNOSIS — I10 Essential (primary) hypertension: Secondary | ICD-10-CM

## 2016-05-30 DIAGNOSIS — I447 Left bundle-branch block, unspecified: Secondary | ICD-10-CM | POA: Diagnosis not present

## 2016-05-30 DIAGNOSIS — E785 Hyperlipidemia, unspecified: Secondary | ICD-10-CM | POA: Diagnosis not present

## 2016-05-30 NOTE — Patient Instructions (Signed)
Your physician has requested that you have an echocardiogram @ [address]. Echocardiography is a painless test that uses sound waves to create images of your heart. It provides your doctor with information about the size and shape of your heart and how well your heart's chambers and valves are working. This procedure takes approximately one hour. There are no restrictions for this procedure.  Your physician recommends that you schedule a follow-up appointment with Dr. Hilty after your echocardiogram  

## 2016-05-31 DIAGNOSIS — R0602 Shortness of breath: Secondary | ICD-10-CM | POA: Insufficient documentation

## 2016-05-31 DIAGNOSIS — I447 Left bundle-branch block, unspecified: Secondary | ICD-10-CM | POA: Insufficient documentation

## 2016-05-31 NOTE — Progress Notes (Signed)
OFFICE NOTE  Chief Complaint:  Dyspnea, palpitations  Primary Care Physician: Binnie Rail, MD  HPI:  Regina Ramsey is a 75 y.o. female who is kindly referred to me for evaluation of palpitations, dyspnea and chest discomfort. She recently was referred for an exercise stress echocardiogram to evaluate symptoms in the setting of left bundle branch block. According to the patient she's had a left bundle branch block for at least 5 years and has had 2 stress tests in the past, both of which were nuclear stress tests. The first study caused her significant nausea vomiting and diarrhea and she was ill for more than 24 hours. The second study apparently caused her last symptoms but did persist for several hours and was a "new stress agent". She was then referred for an exercise echocardiogram however given her left bundle branch block, a stress portion of the study is not interpretable. That test was not performed and she was then referred to me for evaluation. Symptomatically she reports shortness of breath and occasional palpitations but denies any cardiac chest pain. EKG doesn't fact show left bundle branch block which is stable compared to prior EKGs in the past. She has some cardiac risk factors including hypertension and dyslipidemia but is well controlled.  PMHx:  Past Medical History  Diagnosis Date  . MIGRAINE HEADACHE   . HEARING LOSS 07/13/2010  . PALPITATIONS, HX OF   . ALLERGIC RHINITIS   . ANEMIA-NOS   . HYPERTENSION   . HYPERLIPIDEMIA   . GERD   . OSTEOPOROSIS     on Prolia q 45mo hx vertebral fx  . COPD     normal PFTs 06/22/11  . Anxiety   . Irritable bowel syndrome   . Asthma     Triggered by allergies  . Endometriosis   . Diverticulosis of colon   . Vaginal atrophy   . Dyspareunia     Past Surgical History  Procedure Laterality Date  . Breast surgery  1985    breast biopsy  . Appendectomy  1958  . Abdominal hysterectomy  1974    FAMHx:  Family  History  Problem Relation Age of Onset  . Arthritis Mother   . Breast cancer Mother   . Cancer Mother     started on her spine met cancer  . Arthritis Father   . Breast cancer Maternal Aunt   . Diabetes Other     parent & grandparent  . Hyperlipidemia Maternal Grandmother   . Bone cancer Mother   . Colon cancer Neg Hx   . Colon polyps Neg Hx   . Bradycardia Son   . Heart Problems Daughter     SOCHx:   reports that she quit smoking about 27 years ago. She has never used smokeless tobacco. She reports that she drinks alcohol. She reports that she does not use illicit drugs.  ALLERGIES:  No Known Allergies  ROS: Pertinent items noted in HPI and remainder of comprehensive ROS otherwise negative.  HOME MEDS: Current Outpatient Prescriptions  Medication Sig Dispense Refill  . albuterol (PROAIR HFA) 108 (90 Base) MCG/ACT inhaler INHALE 2 PUFFS BY MOUTH EVERY 6 HOURS AS NEEDED FOR WHEEZING 8.5 g 8  . atorvastatin (LIPITOR) 20 MG tablet Take 1 tablet (20 mg total) by mouth daily. 90 tablet 3  . benzonatate (TESSALON) 200 MG capsule Take 1 capsule (200 mg total) by mouth 3 (three) times daily as needed for cough. 60 capsule 0  . fexofenadine (ALLEGRA  ALLERGY) 60 MG tablet Take 1 tablet (60 mg total) by mouth 2 (two) times daily. 30 tablet 12  . Loperamide HCl (IMODIUM PO) Take 1 tablet by mouth as needed.    Marland Kitchen losartan-hydrochlorothiazide (HYZAAR) 50-12.5 MG tablet Take 1 tablet by mouth daily. 90 tablet 3  . potassium chloride 20 MEQ/15ML (10%) SOLN Take 15 mLs (20 mEq total) by mouth daily. 473 mL 1  . traMADol (ULTRAM) 50 MG tablet TAKE 1 TABLET BY MOUTH EVERY 8 HOURS AS NEEDED FOR PAIN (Patient taking differently: TAKE 1/2 TABLET BY MOUTH EVERY 8 HOURS AS NEEDED FOR PAIN) 30 tablet 0   No current facility-administered medications for this visit.    LABS/IMAGING: No results found for this or any previous visit (from the past 48 hour(s)). No results found.  WEIGHTS: Wt Readings  from Last 3 Encounters:  05/30/16 126 lb (57.153 kg)  04/11/16 125 lb (56.7 kg)  12/19/15 124 lb (56.246 kg)    VITALS: BP 118/60 mmHg  Pulse 91  Ht _0  (1.473 m)  Wt 126 lb (57.153 kg)  BMI 26.34 kg/m2  EXAM: General appearance: alert and no distress Neck: no carotid bruit and no JVD Lungs: clear to auscultation bilaterally Heart: regular rate and rhythm, S1, S2 normal, no murmur, click, rub or gallop Abdomen: soft, non-tender; bowel sounds normal; no masses,  no organomegaly Extremities: extremities normal, atraumatic, no cyanosis or edema Pulses: 2+ and symmetric Skin: Skin color, texture, turgor normal. No rashes or lesions Neurologic: Grossly normal Psych: Pleasant  EKG: Normal sinus rhythm at 91  ASSESSMENT: 1. LBBB 2. Palpitations 3. Dyspnea 4. Hypertension-controlled 5. Dyslipidemia  PLAN: 1.   Regina Ramsey presents for evaluation of left bundle branch block and some palpitations and dyspnea. She's had 2 stress tests in 5 years for her left bundle-branch block which was nonischemic but cost her significant side effects. She is not interested in repeat stress testing. She cannot have a plain treadmill stress test or stress echocardiogram as the EKG is uninterpretable. Her symptoms seem to be more related to dyspnea and palpitations. I like to see if there is any structural heart disease or perhaps cardiomyopathy related to dyssynchrony with left bundle branch block. Will order an echocardiogram and if this is reassuring that no further testing is recommended.  Thanks again for the kind referral.   Pixie Casino, MD, [facility] Attending Cardiologist [place] 05/31/2016, 6:48 PM

## 2016-06-06 ENCOUNTER — Encounter: Payer: Self-pay | Admitting: *Deleted

## 2016-06-18 ENCOUNTER — Ambulatory Visit (HOSPITAL_COMMUNITY): Payer: Medicare Other | Attending: Internal Medicine

## 2016-06-18 ENCOUNTER — Other Ambulatory Visit: Payer: Self-pay

## 2016-06-18 DIAGNOSIS — I447 Left bundle-branch block, unspecified: Secondary | ICD-10-CM | POA: Diagnosis not present

## 2016-06-18 DIAGNOSIS — I10 Essential (primary) hypertension: Secondary | ICD-10-CM | POA: Diagnosis not present

## 2016-06-18 DIAGNOSIS — E785 Hyperlipidemia, unspecified: Secondary | ICD-10-CM | POA: Insufficient documentation

## 2016-06-18 DIAGNOSIS — R0600 Dyspnea, unspecified: Secondary | ICD-10-CM | POA: Diagnosis not present

## 2016-06-19 LAB — ECHOCARDIOGRAM COMPLETE
AOASC: 27 cm
AVLVOTPG: 6 mmHg
CHL CUP DOP CALC LVOT VTI: 26.7 cm
CHL CUP MV DEC (S): 141
CHL CUP TV REG PEAK VELOCITY: 267 cm/s
EERAT: 16.8
EWDT: 141 ms
FS: 43 % (ref 28–44)
IV/PV OW: 1.04
LA diam end sys: 33 mm
LA vol A4C: 21.4 ml
LA vol: 21.5 mL
LADIAMINDEX: 2.2 cm/m2
LASIZE: 33 mm
LAVOLIN: 14.3 mL/m2
LV E/e' medial: 16.8
LV PW d: 9.13 mm — AB (ref 0.6–1.1)
LVEEAVG: 16.8
LVELAT: 5.66 cm/s
LVOT area: 2.54 cm2
LVOT peak vel: 126 cm/s
LVOTD: 18 mm
LVOTSV: 68 mL
MV Peak grad: 4 mmHg
MVPKAVEL: 137 m/s
MVPKEVEL: 95.1 m/s
TAPSE: 22.4 mm
TDI e' lateral: 5.66
TDI e' medial: 5.11
TRMAXVEL: 267 cm/s

## 2016-06-20 ENCOUNTER — Telehealth: Payer: Self-pay

## 2016-06-20 NOTE — Telephone Encounter (Signed)
Patient is on the list for [employer] 2017 and may be a good candidate for an AWV in 2017. Please let me know if/when appt is scheduled.   RE: Pt has an appt in October for a 15 minutes visit. Does PCP want to do the AWV since she is new to you or would PCP rather have Manuela Schwartz do it.

## 2016-06-20 NOTE — Telephone Encounter (Signed)
Pinon for susan to do it - can be scheduled then if ok with pt

## 2016-06-25 NOTE — Telephone Encounter (Signed)
Call to Ms. Rahming to schedule AWV; call and scheduled apt for AWV post Dr. Marnee Spring in [place]; STated she was in process of completing her AWV task

## 2016-07-02 ENCOUNTER — Ambulatory Visit: Payer: Medicare Other | Admitting: Internal Medicine

## 2016-07-30 ENCOUNTER — Encounter: Payer: Self-pay | Admitting: Internal Medicine

## 2016-07-30 ENCOUNTER — Ambulatory Visit (INDEPENDENT_AMBULATORY_CARE_PROVIDER_SITE_OTHER): Payer: Medicare Other | Admitting: Internal Medicine

## 2016-07-30 VITALS — BP 135/78 | HR 90 | Ht <= 58 in | Wt 126.2 lb

## 2016-07-30 DIAGNOSIS — R0600 Dyspnea, unspecified: Secondary | ICD-10-CM | POA: Diagnosis not present

## 2016-07-30 DIAGNOSIS — I447 Left bundle-branch block, unspecified: Secondary | ICD-10-CM | POA: Diagnosis not present

## 2016-07-30 DIAGNOSIS — E785 Hyperlipidemia, unspecified: Secondary | ICD-10-CM

## 2016-07-30 DIAGNOSIS — I10 Essential (primary) hypertension: Secondary | ICD-10-CM

## 2016-07-30 NOTE — Progress Notes (Signed)
OFFICE NOTE  Chief Complaint:  Follow-up palpitations and left bundle-branch block  Primary Care Physician: Binnie Rail, MD  HPI:  Regina Ramsey is a 75 y.o. female who is kindly referred to me for evaluation of palpitations, dyspnea and chest discomfort. She recently was referred for an exercise stress echocardiogram to evaluate symptoms in the setting of left bundle branch block. According to the patient she's had a left bundle branch block for at least 5 years and has had 2 stress tests in the past, both of which were nuclear stress tests. The first study caused her significant nausea vomiting and diarrhea and she was ill for more than 24 hours. The second study apparently caused her last symptoms but did persist for several hours and was a "new stress agent". She was then referred for an exercise echocardiogram however given her left bundle branch block, a stress portion of the study is not interpretable. That test was not performed and she was then referred to me for evaluation. Symptomatically she reports shortness of breath and occasional palpitations but denies any cardiac chest pain. EKG doesn't fact show left bundle branch block which is stable compared to prior EKGs in the past. She has some cardiac risk factors including hypertension and dyslipidemia but is well controlled.  07/30/2016  Regina Ramsey returns today for follow-up. She underwent an echocardiogram which showed preserved LVEF 65-70% with mild diastolic dysfunction. She reports her shortness of breath is improved somewhat. She noted when going out to [place] state where she was on vacation for a week, that her breathing was significantly better, which she attributed to cooler air temperatures and less allergens in the air. I did not see a cardiac cause of her dyspnea despite her left bundle branch block. I'm also reassured by her negative stress testing in the past. My suspicion is that her shortness of breath is  related to COPD.  PMHx:  Past Medical History:  Diagnosis Date  . ALLERGIC RHINITIS   . ANEMIA-NOS   . Anxiety   . Asthma    Triggered by allergies  . COPD    normal PFTs 06/22/11  . Diverticulosis of colon   . Dyspareunia   . Endometriosis   . GERD   . HEARING LOSS 07/13/2010  . HYPERLIPIDEMIA   . HYPERTENSION   . Irritable bowel syndrome   . MIGRAINE HEADACHE   . OSTEOPOROSIS    on Prolia q 26mo hx vertebral fx  . PALPITATIONS, HX OF   . Vaginal atrophy     Past Surgical History:  Procedure Laterality Date  . ABDOMINAL HYSTERECTOMY  1974  . APPENDECTOMY  1958  . BREAST SURGERY  1985   breast biopsy    FAMHx:  Family History  Problem Relation Age of Onset  . Arthritis Mother   . Breast cancer Mother   . Cancer Mother     started on her spine met cancer  . Bone cancer Mother   . Arthritis Father   . Breast cancer Maternal Aunt   . Diabetes Other     parent & grandparent  . Hyperlipidemia Maternal Grandmother   . Bradycardia Son   . Heart Problems Daughter     enlarged heart  . Colon cancer Neg Hx   . Colon polyps Neg Hx     SOCHx:   reports that she quit smoking about 27 years ago. She has never used smokeless tobacco. She reports that she drinks alcohol. She reports that she  does not use drugs.  ALLERGIES:  No Known Allergies  ROS: Pertinent items noted in HPI and remainder of comprehensive ROS otherwise negative.  HOME MEDS: Current Outpatient Prescriptions  Medication Sig Dispense Refill  . albuterol (PROAIR HFA) 108 (90 Base) MCG/ACT inhaler INHALE 2 PUFFS BY MOUTH EVERY 6 HOURS AS NEEDED FOR WHEEZING 8.5 g 8  . atorvastatin (LIPITOR) 20 MG tablet Take 1 tablet (20 mg total) by mouth daily. 90 tablet 3  . benzonatate (TESSALON) 200 MG capsule Take 1 capsule (200 mg total) by mouth 3 (three) times daily as needed for cough. 60 capsule 0  . fexofenadine (ALLEGRA ALLERGY) 60 MG tablet Take 1 tablet (60 mg total) by mouth 2 (two) times daily. 30  tablet 12  . Loperamide HCl (IMODIUM PO) Take 1 tablet by mouth as needed.    Marland Kitchen losartan-hydrochlorothiazide (HYZAAR) 50-12.5 MG tablet Take 1 tablet by mouth daily. 90 tablet 3  . potassium chloride 20 MEQ/15ML (10%) SOLN Take 15 mLs (20 mEq total) by mouth daily. 473 mL 1  . traMADol (ULTRAM) 50 MG tablet TAKE 1 TABLET BY MOUTH EVERY 8 HOURS AS NEEDED FOR PAIN (Patient taking differently: TAKE 1/2 TABLET BY MOUTH EVERY 8 HOURS AS NEEDED FOR PAIN) 30 tablet 0   No current facility-administered medications for this visit.     LABS/IMAGING: No results found for this or any previous visit (from the past 48 hour(s)). No results found.  WEIGHTS: Wt Readings from Last 3 Encounters:  07/30/16 126 lb 3.2 oz (57.2 kg)  05/30/16 126 lb (57.2 kg)  04/11/16 125 lb (56.7 kg)    VITALS: BP 135/78 (BP Location: Right Arm, Patient Position: Sitting, Cuff Size: Normal)   Pulse 90   Ht 4' 10"  (1.473 m)   Wt 126 lb 3.2 oz (57.2 kg)   SpO2 97%   BMI 26.38 kg/m   EXAM: Deferred  EKG: Deferred  ASSESSMENT: 1. LBBB 2. Palpitations 3. Dyspnea - LVEF 65-70% (2017) 4. Hypertension-controlled 5. Dyslipidemia  PLAN: 1.   Regina Ramsey has preserved systolic function on her echo despite an LBBB. This is been stable and she had 2 nuclear stress test previously which were negative for ischemia. I think her shortness of breath is more likely to be related to COPD and does seem to improve improved somewhat when she was in a different climate. She may need an additional inhaler if she continues to have some wheezing or mucus production.  Thanks again for the kind referral. Follow-up with me as needed.  Pixie Casino, MD, [facility] Attending Cardiologist [place] 07/30/2016, 1:07 PM

## 2016-07-30 NOTE — Patient Instructions (Addendum)
Medication Instructions:  Your physician recommends that you continue on your current medications as directed. Please refer to the Current Medication list given to you today.  Labwork: NONE ORDERED  Testing/Procedures: NONE ORDERED  Follow-Up: Your physician recommends that you FOLLOW UP AS NEEDED.  Any Other Special Instructions Will Be Listed Below (If Applicable).     If you need a refill on your cardiac medications before your next appointment, please call your pharmacy.

## 2016-09-11 NOTE — Progress Notes (Signed)
Subjective:   Regina Ramsey is a 75 y.o. female who presents for Medicare Annual (Subsequent) preventive examination.       Objective:     No ROS.  Medicare Wellness Visit.  Vitals: There were no vitals taken for this visit.  There is no height or weight on file to calculate BMI.   Tobacco History  Smoking Status  . Former Smoker  . Quit date: 12/31/1988  Smokeless Tobacco  . Never Used    Comment: Married, lives with souse. 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 ALLERGIC RHINITIS   . ANEMIA-NOS   . Anxiety   . Asthma    Triggered by allergies  . COPD    normal PFTs 06/22/11  . Diverticulosis of colon   . Dyspareunia   . Endometriosis   . GERD   . HEARING LOSS 07/13/2010  . HYPERLIPIDEMIA   . HYPERTENSION   . Irritable bowel syndrome   . MIGRAINE HEADACHE   . OSTEOPOROSIS    on Prolia q 33mo hx vertebral fx  . PALPITATIONS, HX OF   . Vaginal atrophy    Past Surgical History:  Procedure Laterality Date  . ABDOMINAL HYSTERECTOMY  1974  . APPENDECTOMY  1958  . BREAST SURGERY  1985   breast biopsy   Family History  Problem Relation Age of Onset  . Arthritis Mother   . Breast cancer Mother   . Cancer Mother     started on her spine met cancer  . Bone cancer Mother   . Arthritis Father   . Breast cancer Maternal Aunt   . Diabetes Other     parent & grandparent  . Hyperlipidemia Maternal Grandmother   . Bradycardia Son   . Heart Problems Daughter     enlarged heart  . Colon cancer Neg Hx   . Colon polyps Neg Hx    History  Sexual Activity  . Sexual activity: Not on file    Outpatient Encounter Prescriptions as of 09/12/2016  Medication Sig  . albuterol (PROAIR HFA) 108 (90 Base) MCG/ACT inhaler INHALE 2 PUFFS BY MOUTH EVERY 6 HOURS AS NEEDED FOR WHEEZING  . atorvastatin (LIPITOR) 20 MG tablet Take 1 tablet (20 mg total) by mouth daily.  .  benzonatate (TESSALON) 200 MG capsule Take 1 capsule (200 mg total) by mouth 3 (three) times daily as needed for cough.  . fexofenadine (ALLEGRA ALLERGY) 60 MG tablet Take 1 tablet (60 mg total) by mouth 2 (two) times daily.  . Loperamide HCl (IMODIUM PO) Take 1 tablet by mouth as needed.  .Marland Kitchenlosartan-hydrochlorothiazide (HYZAAR) 50-12.5 MG tablet Take 1 tablet by mouth daily.  . potassium chloride 20 MEQ/15ML (10%) SOLN Take 15 mLs (20 mEq total) by mouth daily.  . traMADol (ULTRAM) 50 MG tablet TAKE 1 TABLET BY MOUTH EVERY 8 HOURS AS NEEDED FOR PAIN (Patient taking differently: TAKE 1/2 TABLET BY MOUTH EVERY 8 HOURS AS NEEDED FOR PAIN)   No facility-administered encounter medications on file as of 09/12/2016.     Activities of Daily Living No flowsheet data found.  Patient Care Team: SBinnie Rail MD as PCP - General (Internal Medicine) SWonda Horner MD (Gastroenterology) SLavonna Monarch MD (Dermatology)    Assessment:     Physical assessment deferred to PCP.  Sleep patterns:    Home Safety/Smoke Alarms:   Living environment; residence and Firearm Safety:  Seat Belt Safety/Bike Helmet:    Counseling:   Eye Exam-  Dental-  Female:   Pap-N/A    Mammo-04/05/15-Negative       Dexa scan-02/2016-Osteoporosis. Recall 1-2 years. Prolia q 6 months CCS-02/27/2015-Colonoscopy-Polyps. Repeat 5 years.  Exercise Activities and Dietary recommendations   Diet (meal preparation, eat out, water intake, caffeinated beverages, dairy products, fruits and vegetables): Breakfast: Lunch:  Dinner:      Goals    None     Fall Risk Fall Risk  12/19/2015 04/01/2013  Falls in the past year? No No   Depression Screen PHQ 2/9 Scores 12/19/2015 04/01/2013 02/07/2012  PHQ - 2 Score 0 0 0     Cognitive Testing No flowsheet data found.  Immunization History  Administered Date(s) Administered  . Pneumococcal Conjugate-13 10/11/2015  . Pneumococcal Polysaccharide-23 02/07/2012  . Td 06/30/2009    . Zoster 03/30/2014   Screening Tests Health Maintenance  Topic Date Due  . INFLUENZA VACCINE  07/31/2016  . TETANUS/TDAP  07/01/2019  . COLONOSCOPY  02/11/2020  . DEXA SCAN  Completed  . ZOSTAVAX  Completed  . PNA vac Low Risk Adult  Completed      Plan:     Continue to eat heart healthy diet (full of fruits, vegetables, whole grains, lean protein, water--limit salt, fat, and sugar intake) and increase physical activity as tolerated.  Continue doing brain stimulating activities (puzzles, reading, adult coloring books, staying active) to keep memory sharp.    During the course of the visit the patient was educated and counseled about the following appropriate screening and preventive services:   Vaccines to include Pneumoccal, Influenza, Hepatitis B, Td, Zostavax, HCV  Cardiovascular Disease  Colorectal cancer screening  Bone density screening  Diabetes screening  Glaucoma screening  Mammography/PAP  Nutrition counseling   Patient Instructions (the written plan) was given to the patient.   Gerilyn Nestle, RN  09/12/2016

## 2016-09-12 ENCOUNTER — Telehealth: Payer: Self-pay

## 2016-09-12 NOTE — Telephone Encounter (Signed)
Call to Regina Ramsey; Stated she forgot her apt today, they are having some family issues.  Will plan to reschedule but would prefer to call [place] at a later time.  STated that her apt is scheduled with Dr. [name] on a Thursday 10/12 and could possible do before or p she sees Dr. [name] and to please let us know so we can put this on the schedule.  She agreed to do so

## 2016-10-11 ENCOUNTER — Ambulatory Visit (INDEPENDENT_AMBULATORY_CARE_PROVIDER_SITE_OTHER): Payer: Medicare Other | Admitting: Internal Medicine

## 2016-10-11 ENCOUNTER — Other Ambulatory Visit (INDEPENDENT_AMBULATORY_CARE_PROVIDER_SITE_OTHER): Payer: Medicare Other

## 2016-10-11 ENCOUNTER — Encounter: Payer: Self-pay | Admitting: Internal Medicine

## 2016-10-11 VITALS — BP 130/78 | HR 90 | Temp 98.3°F | Resp 19 | Ht <= 58 in | Wt 123.0 lb

## 2016-10-11 DIAGNOSIS — E7800 Pure hypercholesterolemia, unspecified: Secondary | ICD-10-CM | POA: Diagnosis not present

## 2016-10-11 DIAGNOSIS — I10 Essential (primary) hypertension: Secondary | ICD-10-CM

## 2016-10-11 DIAGNOSIS — R7303 Prediabetes: Secondary | ICD-10-CM | POA: Diagnosis not present

## 2016-10-11 DIAGNOSIS — M810 Age-related osteoporosis without current pathological fracture: Secondary | ICD-10-CM | POA: Diagnosis not present

## 2016-10-11 DIAGNOSIS — K591 Functional diarrhea: Secondary | ICD-10-CM

## 2016-10-11 DIAGNOSIS — M545 Low back pain, unspecified: Secondary | ICD-10-CM | POA: Insufficient documentation

## 2016-10-11 DIAGNOSIS — G8929 Other chronic pain: Secondary | ICD-10-CM

## 2016-10-11 LAB — COMPREHENSIVE METABOLIC PANEL
ALBUMIN: 3.9 g/dL (ref 3.5–5.2)
ALK PHOS: 53 U/L (ref 39–117)
ALT: 24 U/L (ref 0–35)
AST: 24 U/L (ref 0–37)
BILIRUBIN TOTAL: 0.7 mg/dL (ref 0.2–1.2)
BUN: 18 mg/dL (ref 6–23)
CO2: 33 mEq/L — ABNORMAL HIGH (ref 19–32)
CREATININE: 0.86 mg/dL (ref 0.40–1.20)
Calcium: 9.4 mg/dL (ref 8.4–10.5)
Chloride: 100 mEq/L (ref 96–112)
GFR: 68.27 mL/min (ref >60.00)
Glucose, Bld: 103 mg/dL — ABNORMAL HIGH (ref 70–99)
POTASSIUM: 3.5 meq/L (ref 3.5–5.1)
SODIUM: 139 meq/L (ref 135–145)
TOTAL PROTEIN: 7.2 g/dL (ref 6.0–8.3)

## 2016-10-11 LAB — LIPID PANEL
Cholesterol: 166 mg/dL (ref 0–200)
HDL: 38.7 mg/dL — AB (ref >39.00)
LDL Cholesterol: 99 mg/dL (ref 0–99)
NONHDL: 127.68
Total CHOL/HDL Ratio: 4
Triglycerides: 145 mg/dL (ref 0.0–149.0)
VLDL: 29 mg/dL (ref 0.0–40.0)

## 2016-10-11 LAB — HEMOGLOBIN A1C: HEMOGLOBIN A1C: 5.8 % (ref 4.6–6.5)

## 2016-10-11 MED ORDER — TRAMADOL HCL 50 MG PO TABS: ORAL_TABLET | ORAL | 0 refills | Status: DC

## 2016-10-11 NOTE — Assessment & Plan Note (Signed)
Intermittent, chronic Taking tramadol only as needed-only takes half of a pill She would like a refill Okay to refill-encouraged her to only take as needed

## 2016-10-11 NOTE — Assessment & Plan Note (Signed)
Hypokalemia May 2017, will receive another injection in November Stressed starting calcium and vitamin D Stressed regular exercise

## 2016-10-11 NOTE — Progress Notes (Signed)
Keigh[place],  Your kidney and liver tests are normal. Your cholesterol is good, but your HDL or good cholesterol is slightly on the low side. Your sugars are better controlled-your A1c is 5.8% and was 6.2% 6 months ago.  Overall everything looks good.  Let me know if you have any questions or concerns.   Dr. Billey Gosling.

## 2016-10-11 NOTE — Patient Instructions (Addendum)
Start taking vitamin B12 1000 mcg  Start taking calcium 500-600 mg of calcium twice daily and 1000 units vitamin d a day.    Test(s) ordered today. Your results will be released to [place] (or called to you) after review, usually within 72hours after test completion. If any changes need to be made, you will be notified at that same time.  All other Health Maintenance issues reviewed.   All recommended immunizations and age-appropriate screenings are up-to-date or discussed.  No immunizations administered today.   Medications reviewed and updated.  No changes recommended at this time.  Your prescription(s) have been submitted to your pharmacy. Please take as directed and contact our office if you believe you are having problem(s) with the medication(s).   Please followup in 6 months

## 2016-10-11 NOTE — Progress Notes (Signed)
Pre visit review using our clinic review tool, if applicable. No additional management support is needed unless otherwise documented below in the visit note. 

## 2016-10-11 NOTE — Assessment & Plan Note (Signed)
Check lipid panel.  Continue atorvastatin. 

## 2016-10-11 NOTE — Progress Notes (Signed)
Subjective:    Patient ID: Regina Ramsey, female    DOB: 11/07/1941, 75 y.o.   MRN: [id number]  HPI The patient is here for follow up.  She has been eating healthy and has lost weight. She is exercising regularly.  She continues to have these episodes of tachycardia associated with lightheadedness, a feeling of her legs aching and shortness of breath. She can have them a couple of times a month. She has been monitoring them closely and the highest heart rate she is seen is around 120. She has discussed this with cardiology and they did not see anything concerning. The episodes can last approximately 30 minutes.  Hypertension: She is taking her medication daily. She is compliant with a low sodium diet.  She denies chest pain, edema and regular headaches. She is exercising regularly.  She does not monitor her blood pressure at home.    Prediabetes:  She is compliant with a low sugar/carbohydrate diet.  She is exercising regularly.  Hyperlipidemia: She is taking her medication daily. She is compliant with a low fat/cholesterol diet. She is exercising regularly. She denies myalgias.   B12 def:  She has had a low B12 level in the recent past. I advised her to start vitamin B12 daily, but she has not started taking that.  Osteoporosis:  Had prolia 04/2016.  She is not currently taking calcium and vitamin d daily.  She is exercising.  Back pain: She has occasional back pain. If the pain is more severe she will take half tramadol. She would like a refill today.  Medications and allergies reviewed with patient and updated if appropriate.  Patient Active Problem List   Diagnosis Date Noted  . Dyspnea 05/31/2016  . LBBB (left bundle branch block) 05/31/2016  . Prediabetes 04/15/2016  . Palpitations 04/11/2016  . History of colonic polyps 03/07/2015  . Diarrhea 01/06/2015  . Vaginal atrophy 07/23/2014  . Dyspareunia 07/23/2014  . Hx of endometriosis 06/30/2014  . Diverticulosis of  colon 06/30/2014  . Osteoporosis 08/16/2010  . Hyperlipidemia 07/13/2010  . ANEMIA-NOS 07/13/2010  . MIGRAINE HEADACHE 07/13/2010  . HEARING LOSS 07/13/2010  . Essential hypertension 07/13/2010  . Allergic rhinitis 07/13/2010  . ASTHMA 07/13/2010  . GERD 07/13/2010    Current Outpatient Prescriptions on File Prior to Visit  Medication Sig Dispense Refill  . albuterol (PROAIR HFA) 108 (90 Base) MCG/ACT inhaler INHALE 2 PUFFS BY MOUTH EVERY 6 HOURS AS NEEDED FOR WHEEZING 8.5 g 8  . atorvastatin (LIPITOR) 20 MG tablet Take 1 tablet (20 mg total) by mouth daily. 90 tablet 3  . benzonatate (TESSALON) 200 MG capsule Take 1 capsule (200 mg total) by mouth 3 (three) times daily as needed for cough. 60 capsule 0  . fexofenadine (ALLEGRA ALLERGY) 60 MG tablet Take 1 tablet (60 mg total) by mouth 2 (two) times daily. 30 tablet 12  . Loperamide HCl (IMODIUM PO) Take 1 tablet by mouth as needed.    Marland Kitchen losartan-hydrochlorothiazide (HYZAAR) 50-12.5 MG tablet Take 1 tablet by mouth daily. 90 tablet 3  . potassium chloride 20 MEQ/15ML (10%) SOLN Take 15 mLs (20 mEq total) by mouth daily. 473 mL 1  . traMADol (ULTRAM) 50 MG tablet TAKE 1 TABLET BY MOUTH EVERY 8 HOURS AS NEEDED FOR PAIN (Patient taking differently: TAKE 1/2 TABLET BY MOUTH EVERY 8 HOURS AS NEEDED FOR PAIN) 30 tablet 0   No current facility-administered medications on file prior to visit.     Past Medical  History:  Diagnosis Date  . ALLERGIC RHINITIS   . ANEMIA-NOS   . Anxiety   . Asthma    Triggered by allergies  . COPD    normal PFTs 06/22/11  . Diverticulosis of colon   . Dyspareunia   . Endometriosis   . GERD   . HEARING LOSS 07/13/2010  . HYPERLIPIDEMIA   . HYPERTENSION   . Irritable bowel syndrome   . MIGRAINE HEADACHE   . OSTEOPOROSIS    on Prolia q 62mo hx vertebral fx  . PALPITATIONS, HX OF   . Vaginal atrophy     Past Surgical History:  Procedure Laterality Date  . ABDOMINAL HYSTERECTOMY  1974  . APPENDECTOMY   1958  . BREAST SURGERY  1985   breast biopsy    Social History   Social History  . Marital status: Married    Spouse name: N/A  . Number of children: 2  . Years of education: N/A   Occupational History  . Retired    Social History Main Topics  . Smoking status: Former Smoker    Quit date: 12/31/1988  . Smokeless tobacco: Never Used     Comment: Married, lives with souse. 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 Alcohol use 0.0 oz/week     Comment: Social  . Drug use: No  . Sexual activity: Not on file   Other Topics Concern  . Not on file   Social History Narrative   Retired tPharmacist, hospital     No regular exercise, active      epworth sleepiness scale = 10 (05/30/16)    Family History  Problem Relation Age of Onset  . Arthritis Mother   . Breast cancer Mother   . Cancer Mother     started on her spine met cancer  . Bone cancer Mother   . Arthritis Father   . Breast cancer Maternal Aunt   . Diabetes Other     parent & grandparent  . Hyperlipidemia Maternal Grandmother   . Bradycardia Son   . Heart Problems Daughter     enlarged heart  . Colon cancer Neg Hx   . Colon polyps Neg Hx     Review of Systems  Constitutional: Negative for fever.  Respiratory: Positive for shortness of breath (with episodes). Negative for cough and wheezing.   Cardiovascular: Positive for palpitations (with episodes). Negative for chest pain and leg swelling.  Neurological: Positive for light-headedness (with episodes). Negative for headaches.       Objective:   Vitals:   10/11/16 0940  BP: 130/78  Pulse: 90  Resp: 19  Temp: 98.3 F (36.8 C)   Filed Weights   10/11/16 0940  Weight: 123 lb (55.8 kg)   Body mass index is 25.71 kg/m.   Physical Exam    Constitutional: Appears well-developed and well-nourished. No distress.  HENT:  Head: Normocephalic and atraumatic.  Neck: Neck supple. No tracheal deviation present. No thyromegaly present.  No cervical  lymphadenopathy Cardiovascular: Normal rate, regular rhythm and normal heart sounds.   No murmur heard. No carotid bruit .  No edema Pulmonary/Chest: Effort normal and breath sounds normal. No respiratory distress. No has no wheezes. No rales.  Skin: Skin is warm and dry. Not diaphoretic.  Psychiatric: Normal mood and affect. Behavior is normal.      Assessment & Plan:    See Problem List for Assessment and Plan of chronic medical problems.

## 2016-10-11 NOTE — Assessment & Plan Note (Signed)
IDS- D Takes imodium as needed

## 2016-10-11 NOTE — Assessment & Plan Note (Signed)
BP well controlled Current regimen effective and well tolerated Continue current medications at current doses cmp  

## 2016-10-11 NOTE — Assessment & Plan Note (Signed)
Check A1c Continue healthy diet, regular exercise

## 2016-10-17 NOTE — Progress Notes (Signed)
Subjective:   Regina Ramsey is a 75 y.o. female who presents for Medicare Annual (Subsequent) preventive examination.  The Patient was informed that the wellness visit is to identify future health risk and educate and initiate measures that can reduce risk for increased disease through the lifespan.   Describes health as fair, good or great? "good"  Review of Systems:  No ROS.  Medicare Wellness Visit.  Cardiac Risk Factors include: dyslipidemia;family history of premature cardiovascular disease;hypertension   Sleep patterns: sleeps 4-6 hours, up 2x to void.  Home Safety/Smoke Alarms: Smoke detectors in place. Carbon Monoxide detectors in place.  Living environment; residence and Firearm Safety: Lives in 3 story home with husband, son and occasional 2 grandson. 1 dog. Firearms are locked away.  Seat Belt Safety/Bike Helmet: Wears seatbelt.    Counseling:   Eye Exam-Last exam last year. Will make appointment.  Dental-last 2 months ago, Dr. Conley Canal.   Female:   Pap-N/A ; Hysterectomy.    Mammo-04/06/15; negative. Plans to schedule at 2 years.         Dexa scan-02/16/16;Osteoporosis. Recall 2 years. Prolia 04/2016. Not taking Vit D or Calcium.  [facility]-colonoscopy 02/10/15; Normal (negative polyps). Recall 5 years.       Objective:     Vitals: BP 120/60 (BP Location: Left Arm, Patient Position: Sitting, Cuff Size: Normal)   Pulse 80   Ht 4' 10"  (1.473 m)   Wt 125 lb 0.6 oz (56.7 kg)   SpO2 98%   BMI 26.13 kg/m   Body mass index is 26.13 kg/m.   Tobacco History  Smoking Status  . Former Smoker  . Quit date: 12/31/1988  Smokeless Tobacco  . Never Used    Comment: Married, lives with souse. 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 ALLERGIC RHINITIS   . ANEMIA-NOS   . Anxiety   . Asthma    Triggered by allergies  . COPD    normal PFTs 06/22/11  . Diverticulosis of colon    . Dyspareunia   . Endometriosis   . GERD   . HEARING LOSS 07/13/2010  . HYPERLIPIDEMIA   . HYPERTENSION   . Irritable bowel syndrome   . MIGRAINE HEADACHE   . OSTEOPOROSIS    on Prolia q 57mo hx vertebral fx  . PALPITATIONS, HX OF   . Vaginal atrophy    Past Surgical History:  Procedure Laterality Date  . ABDOMINAL HYSTERECTOMY  1974  . APPENDECTOMY  1958  . BREAST SURGERY  1985   breast biopsy   Family History  Problem Relation Age of Onset  . Arthritis Mother   . Breast cancer Mother   . Cancer Mother     started on her spine met cancer  . Bone cancer Mother   . Arthritis Father   . Breast cancer Maternal Aunt   . Diabetes Other     parent & grandparent  . Hyperlipidemia Maternal Grandmother   . Bradycardia Son   . Heart Problems Daughter     enlarged heart  . Cancer Brother   . Colon cancer Neg Hx   . Colon polyps Neg Hx    History  Sexual Activity  . Sexual activity: Not on file    Outpatient Encounter Prescriptions as of 10/18/2016  Medication Sig  . albuterol (PROAIR HFA) 108 (90 Base) MCG/ACT inhaler INHALE 2 PUFFS BY MOUTH EVERY 6 HOURS AS  NEEDED FOR WHEEZING  . atorvastatin (LIPITOR) 20 MG tablet Take 1 tablet (20 mg total) by mouth daily.  . benzonatate (TESSALON) 200 MG capsule Take 1 capsule (200 mg total) by mouth 3 (three) times daily as needed for cough.  . fexofenadine (ALLEGRA ALLERGY) 60 MG tablet Take 1 tablet (60 mg total) by mouth 2 (two) times daily.  . Loperamide HCl (IMODIUM PO) Take 1 tablet by mouth as needed.  Marland Kitchen losartan-hydrochlorothiazide (HYZAAR) 50-12.5 MG tablet Take 1 tablet by mouth daily.  . traMADol (ULTRAM) 50 MG tablet TAKE 1/2 TABLET BY MOUTH EVERY 8 HOURS AS NEEDED FOR PAIN  . vitamin B-12 (CYANOCOBALAMIN) 1000 MCG tablet Take 1,000 mcg by mouth daily.   No facility-administered encounter medications on file as of 10/18/2016.     Activities of Daily Living In your present state of health, do you have any difficulty  performing the following activities: 10/18/2016  Hearing? Y  Vision? N  Difficulty concentrating or making decisions? N  Walking or climbing stairs? N  Dressing or bathing? N  Doing errands, shopping? N  Preparing Food and eating ? N  Using the Toilet? N  In the past six months, have you accidently leaked urine? Y  Do you have problems with loss of bowel control? N  Managing your Medications? N  Managing your Finances? N  Housekeeping or managing your Housekeeping? N  Some recent data might be hidden    Patient Care Team: Binnie Rail, MD as PCP - General (Internal Medicine) Wonda Horner, MD (Gastroenterology) Lavonna Monarch, MD (Dermatology) Pixie Casino, MD as Consulting Physician (Cardiology) Inda Castle, MD as Consulting Physician (Gastroenterology) Lonzo Candy, AUD (Audiology) Marica Otter, OD (Optometry)    Assessment:    Physical assessment deferred to PCP.  Exercise Activities and Dietary recommendations Current Exercise Habits: The patient does not participate in regular exercise at present, Exercise limited by: cardiac condition(s);orthopedic condition(s);respiratory conditions(s) Diet (meal preparation, eat out, water intake, caffeinated beverages, dairy products, fruits and vegetables):Prepares own meals. Crock-Pot meals. Pork loin and chuck roast with vegetables.  Skips breakfast. Caffeine-free Pepsi. Very limited water intake. Mango juice.  Encouraged to increase water intake and decrease sugary drinks.  Educated patient regarding high sodium content of seasoning packets used.   Patient reports episodes of diarrhea frequently, so is careful with foods that trigger episodes (white sauces, seafood, "anything that tastes good"). Patient has been evaluated by G.I.   Goals      Patient Stated   . <enter goal here> (pt-stated)          Patient would like to be healthy enough to climb mountains in [place]. Plans to increase walking around neighborhood and  treadmill at [facility]. Decrease sugary drinks.      Fall Risk Fall Risk  10/18/2016 12/19/2015 04/01/2013  Falls in the past year? Yes No No  Number falls in past yr: 2 or more - -  Injury with Fall? No - -  Risk Factor Category  High Fall Risk - -  Risk for fall due to : History of fall(s) - -  Risk for fall due to (comments): Falls when taking stairs with glasses on.  - -  Follow up Falls prevention discussed - -   Depression Screen PHQ 2/9 Scores 10/18/2016 12/19/2015 04/01/2013 02/07/2012  PHQ - 2 Score 0 0 0 0     Cognitive Testing MMSE - Mini Mental State Exam 10/18/2016  Orientation to time 5  Orientation to Place 5  Registration 3  Attention/ Calculation 5  Recall 3  Language- name 2 objects 2  Language- repeat 1  Language- follow 3 step command 3  Language- read & follow direction 1  Write a sentence 1  Copy design 1  Total score 30    Immunization History  Administered Date(s) Administered  . Pneumococcal Conjugate-13 10/11/2015  . Pneumococcal Polysaccharide-23 02/07/2012  . Td 06/30/2009  . Zoster 03/30/2014   Screening Tests Health Maintenance  Topic Date Due  . INFLUENZA VACCINE  03/31/2017 (Originally 07/31/2016)  . TETANUS/TDAP  07/01/2019  . COLONOSCOPY  02/11/2020  . DEXA SCAN  Completed  . ZOSTAVAX  Completed  . PNA vac Low Risk Adult  Completed   Patient refused Flu Vaccine.     Plan:     Begin to eat heart healthy diet (full of fruits, vegetables, whole grains, lean protein, water--limit salt, fat, and sugar intake) and increase physical activity as tolerated.  Continue doing brain stimulating activities (puzzles, reading, adult coloring books, staying active) to keep memory sharp.   Bring a copy of your advanced directives to your next office visit.  Make  Appointment with audiologist.   Patient concerns: SOB on occasion- evaluated by Cardiology. Diarrhea-evaluated by Gastroenterology.   During the course of the visit the patient was  educated and counseled about the following appropriate screening and preventive services:   Vaccines to include Pneumoccal, Influenza, Hepatitis B, Td, Zostavax, HCV  Cardiovascular Disease  Colorectal cancer screening  Bone density screening  Diabetes screening  Glaucoma screening  Mammography/PAP  Nutrition counseling   Patient Instructions (the written plan) was given to the patient.   Gerilyn Nestle, RN  10/18/2016   Medical screening examination/treatment/procedure(s) were performed by non-physician practitioner and as supervising physician I was immediately available for consultation/collaboration. I agree with above. Binnie Rail, MD

## 2016-10-18 ENCOUNTER — Ambulatory Visit (INDEPENDENT_AMBULATORY_CARE_PROVIDER_SITE_OTHER): Payer: Medicare Other

## 2016-10-18 VITALS — BP 120/60 | HR 80 | Ht <= 58 in | Wt 125.0 lb

## 2016-10-18 DIAGNOSIS — Z0000 Encounter for general adult medical examination without abnormal findings: Secondary | ICD-10-CM | POA: Diagnosis not present

## 2016-10-18 DIAGNOSIS — E7800 Pure hypercholesterolemia, unspecified: Secondary | ICD-10-CM | POA: Diagnosis not present

## 2016-10-18 NOTE — Patient Instructions (Addendum)
Begin to eat heart healthy diet (full of fruits, vegetables, whole grains, lean protein, water--limit salt, fat, and sugar intake) and increase physical activity as tolerated.  Continue doing brain stimulating activities (puzzles, reading, adult coloring books, staying active) to keep memory sharp.   Bring a copy of your advanced directives to your next office visit.  Make  Appointment with audiologist.    Fat and Cholesterol Restricted Diet High levels of fat and cholesterol in your blood may lead to various health problems, such as diseases of the heart, blood vessels, gallbladder, liver, and pancreas. Fats are concentrated sources of energy that come in various forms. Certain types of fat, including saturated fat, may be harmful in excess. Cholesterol is a substance needed by your body in small amounts. Your body makes all the cholesterol it needs. Excess cholesterol comes from the food you eat. When you have high levels of cholesterol and saturated fat in your blood, health problems can develop because the excess fat and cholesterol will gather along the walls of your blood vessels, causing them to narrow. Choosing the right foods will help you control your intake of fat and cholesterol. This will help keep the levels of these substances in your blood within normal limits and reduce your risk of disease. WHAT IS MY PLAN? Your health care provider recommends that you:  Get no more than __________ % of the total calories in your daily diet from fat.  Limit your intake of saturated fat to less than ______% of your total calories each day.  Limit the amount of cholesterol in your diet to less than _________mg per day. WHAT TYPES OF FAT SHOULD I CHOOSE?  Choose healthy fats more often. Choose monounsaturated and polyunsaturated fats, such as olive and canola oil, flaxseeds, walnuts, almonds, and seeds.  Eat more omega-3 fats. Good choices include salmon, mackerel, sardines, tuna, flaxseed oil,  and ground flaxseeds. Aim to eat fish at least two times a week.  Limit saturated fats. Saturated fats are primarily found in animal products, such as meats, butter, and cream. Plant sources of saturated fats include palm oil, palm kernel oil, and coconut oil.  Avoid foods with partially hydrogenated oils in them. These contain trans fats. Examples of foods that contain trans fats are stick margarine, some tub margarines, cookies, crackers, and other baked goods. WHAT GENERAL GUIDELINES DO I NEED TO FOLLOW? These guidelines for healthy eating will help you control your intake of fat and cholesterol:  Check food labels carefully to identify foods with trans fats or high amounts of saturated fat.  Fill one half of your plate with vegetables and green salads.  Fill one fourth of your plate with whole grains. Look for the word "whole" as the first word in the ingredient list.  Fill one fourth of your plate with lean protein foods.  Limit fruit to two servings a day. Choose fruit instead of juice.  Eat more foods that contain soluble fiber. Examples of foods that contain this type of fiber are apples, broccoli, carrots, beans, peas, and barley. Aim to get 20-30 g of fiber per day.  Eat more home-cooked food and less restaurant, buffet, and fast food.  Limit or avoid alcohol.  Limit foods high in starch and sugar.  Limit fried foods.  Cook foods using methods other than frying. Baking, boiling, grilling, and broiling are all great options.  Lose weight if you are overweight. Losing just 5-10% of your initial body weight can help your overall health and  prevent diseases such as diabetes and heart disease. WHAT FOODS CAN I EAT? Grains Whole grains, such as whole wheat or whole grain breads, crackers, cereals, and pasta. Unsweetened oatmeal, bulgur, barley, quinoa, or brown rice. Corn or whole wheat flour tortillas. Vegetables Fresh or frozen vegetables (raw, steamed, roasted, or grilled).  Green salads. Fruits All fresh, canned (in natural juice), or frozen fruits. Meat and Other Protein Products Ground beef (85% or leaner), grass-fed beef, or beef trimmed of fat. Skinless chicken or [place]. Ground chicken or [place]. Pork trimmed of fat. All fish and seafood. Eggs. Dried beans, peas, or lentils. Unsalted nuts or seeds. Unsalted canned or dry beans. Dairy Low-fat dairy products, such as skim or 1% milk, 2% or reduced-fat cheeses, low-fat ricotta or cottage cheese, or plain low-fat yogurt. Fats and Oils Tub margarines without trans fats. Light or reduced-fat mayonnaise and salad dressings. Avocado. Olive, canola, sesame, or safflower oils. Natural peanut or almond butter (choose ones without added sugar and oil). The items listed above may not be a complete list of recommended foods or beverages. Contact your dietitian for more options. WHAT FOODS ARE NOT RECOMMENDED? Grains White bread. White pasta. White rice. Cornbread. Bagels, pastries, and croissants. Crackers that contain trans fat. Vegetables White potatoes. Corn. Creamed or fried vegetables. Vegetables in a cheese sauce. Fruits Dried fruits. Canned fruit in light or heavy syrup. Fruit juice. Meat and Other Protein Products Fatty cuts of meat. Ribs, chicken wings, bacon, sausage, bologna, salami, chitterlings, fatback, hot dogs, bratwurst, and packaged luncheon meats. Liver and organ meats. Dairy Whole or 2% milk, cream, half-and-half, and cream cheese. Whole milk cheeses. Whole-fat or sweetened yogurt. Full-fat cheeses. Nondairy creamers and whipped toppings. Processed cheese, cheese spreads, or cheese curds. Sweets and Desserts Corn syrup, sugars, honey, and molasses. Candy. Jam and jelly. Syrup. Sweetened cereals. Cookies, pies, cakes, donuts, muffins, and ice cream. Fats and Oils Butter, stick margarine, lard, shortening, ghee, or bacon fat. Coconut, palm kernel, or palm oils. Beverages Alcohol. Sweetened drinks  (such as sodas, lemonade, and fruit drinks or punches). The items listed above may not be a complete list of foods and beverages to avoid. Contact your dietitian for more information.   This information is not intended to replace advice given to you by your health care provider. Make sure you discuss any questions you have with your health care provider.   Document Released: 12/17/2005 Document Revised: 01/07/2015 Document Reviewed: 03/17/2014 Elsevier Interactive Patient Education 2016 [place] in the Home  Falls can cause injuries. They can happen to people of all ages. There are many things you can do to make your home safe and to help prevent falls.  WHAT CAN I DO ON THE OUTSIDE OF MY HOME?  Regularly fix the edges of walkways and driveways and fix any cracks.  Remove anything that might make you trip as you walk through a door, such as a raised step or threshold.  Trim any bushes or trees on the path to your home.  Use bright outdoor lighting.  Clear any walking paths of anything that might make someone trip, such as rocks or tools.  Regularly check to see if handrails are loose or broken. Make sure that both sides of any steps have handrails.  Any raised decks and porches should have guardrails on the edges.  Have any leaves, snow, or ice cleared regularly.  Use sand or salt on walking paths during winter.  Clean up any spills in your  garage right away. This includes oil or grease spills. WHAT CAN I DO IN THE BATHROOM?   Use night lights.  Install grab bars by the toilet and in the tub and shower. Do not use towel bars as grab bars.  Use non-skid mats or decals in the tub or shower.  If you need to sit down in the shower, use a plastic, non-slip stool.  Keep the floor dry. Clean up any water that spills on the floor as soon as it happens.  Remove soap buildup in the tub or shower regularly.  Attach bath mats securely with double-sided  non-slip rug tape.  Do not have throw rugs and other things on the floor that can make you trip. WHAT CAN I DO IN THE BEDROOM?  Use night lights.  Make sure that you have a light by your bed that is easy to reach.  Do not use any sheets or blankets that are too big for your bed. They should not hang down onto the floor.  Have a firm chair that has side arms. You can use this for support while you get dressed.  Do not have throw rugs and other things on the floor that can make you trip. WHAT CAN I DO IN THE KITCHEN?  Clean up any spills right away.  Avoid walking on wet floors.  Keep items that you use a lot in easy-to-reach places.  If you need to reach something above you, use a strong step stool that has a grab bar.  Keep electrical cords out of the way.  Do not use floor polish or wax that makes floors slippery. If you must use wax, use non-skid floor wax.  Do not have throw rugs and other things on the floor that can make you trip. WHAT CAN I DO WITH MY STAIRS?  Do not leave any items on the stairs.  Make sure that there are handrails on both sides of the stairs and use them. Fix handrails that are broken or loose. Make sure that handrails are as long as the stairways.  Check any carpeting to make sure that it is firmly attached to the stairs. Fix any carpet that is loose or worn.  Avoid having throw rugs at the top or bottom of the stairs. If you do have throw rugs, attach them to the floor with carpet tape.  Make sure that you have a light switch at the top of the stairs and the bottom of the stairs. If you do not have them, ask someone to add them for you. WHAT ELSE CAN I DO TO HELP PREVENT FALLS?  Wear shoes that:  Do not have high heels.  Have rubber bottoms.  Are comfortable and fit you well.  Are closed at the toe. Do not wear sandals.  If you use a stepladder:  Make sure that it is fully opened. Do not climb a closed stepladder.  Make sure that both  sides of the stepladder are locked into place.  Ask someone to hold it for you, if possible.  Clearly mark and make sure that you can see:  Any grab bars or handrails.  First and last steps.  Where the edge of each step is.  Use tools that help you move around (mobility aids) if they are needed. These include:  Canes.  Walkers.  Scooters.  Crutches.  Turn on the lights when you go into a dark area. Replace any light bulbs as soon as they burn out.  Set up your furniture so you have a clear path. Avoid moving your furniture around.  If any of your floors are uneven, fix them.  If there are any pets around you, be aware of where they are.  Review your medicines with your doctor. Some medicines can make you feel dizzy. This can increase your chance of falling. Ask your doctor what other things that you can do to help prevent falls.   This information is not intended to replace advice given to you by your health care provider. Make sure you discuss any questions you have with your health care provider.   Document Released: 10/13/2009 Document Revised: 05/03/2015 Document Reviewed: 01/21/2015 Elsevier Interactive Patient Education 2016 [place], Female Adopting a healthy lifestyle and getting preventive care can go a long way to promote health and wellness. Talk with your health care provider about what schedule of regular examinations is right for you. This is a good chance for you to check in with your provider about disease prevention and staying healthy. In between checkups, there are plenty of things you can do on your own. Experts have done a lot of research about which lifestyle changes and preventive measures are most likely to keep you healthy. Ask your health care provider for more information. WEIGHT AND DIET  Eat a healthy diet  Be sure to include plenty of vegetables, fruits, low-fat dairy products, and lean protein.  Do not eat a lot of  foods high in solid fats, added sugars, or salt.  Get regular exercise. This is one of the most important things you can do for your health.  Most adults should exercise for at least 150 minutes each week. The exercise should increase your heart rate and make you sweat (moderate-intensity exercise).  Most adults should also do strengthening exercises at least twice a week. This is in addition to the moderate-intensity exercise.  Maintain a healthy weight  Body mass index (BMI) is a measurement that can be used to identify possible weight problems. It estimates body fat based on height and weight. Your health care provider can help determine your BMI and help you achieve or maintain a healthy weight.  For females 15 years of age and older:   A BMI below 18.5 is considered underweight.  A BMI of 18.5 to 24.9 is normal.  A BMI of 25 to 29.9 is considered overweight.  A BMI of 30 and above is considered obese.  Watch levels of cholesterol and blood lipids  You should start having your blood tested for lipids and cholesterol at 75 years of age, then have this test every 5 years.  You may need to have your cholesterol levels checked more often if:  Your lipid or cholesterol levels are high.  You are older than 75 years of age.  You are at high risk for heart disease.  CANCER SCREENING   Lung Cancer  Lung cancer screening is recommended for adults 54-26 years old who are at high risk for lung cancer because of a history of smoking.  A yearly low-dose CT scan of the lungs is recommended for people who:  Currently smoke.  Have quit within the past 15 years.  Have at least a 30-pack-year history of smoking. A pack year is smoking an average of one pack of cigarettes a day for 1 year.  Yearly screening should continue until it has been 15 years since you quit.  Yearly screening should stop if you develop a  health problem that would prevent you from having lung cancer  treatment.  Breast Cancer  Practice breast self-awareness. This means understanding how your breasts normally appear and feel.  It also means doing regular breast self-exams. Let your health care provider know about any changes, no matter how small.  If you are in your 20s or 30s, you should have a clinical breast exam (CBE) by a health care provider every 1-3 years as part of a regular health exam.  If you are 59 or older, have a CBE every year. Also consider having a breast X-ray (mammogram) every year.  If you have a family history of breast cancer, talk to your health care provider about genetic screening.  If you are at high risk for breast cancer, talk to your health care provider about having an MRI and a mammogram every year.  Breast cancer gene (BRCA) assessment is recommended for women who have family members with BRCA-related cancers. BRCA-related cancers include:  Breast.  Ovarian.  Tubal.  Peritoneal cancers.  Results of the assessment will determine the need for genetic counseling and BRCA1 and BRCA2 testing. Cervical Cancer Your health care provider may recommend that you be screened regularly for cancer of the pelvic organs (ovaries, uterus, and vagina). This screening involves a pelvic examination, including checking for microscopic changes to the surface of your cervix (Pap test). You may be encouraged to have this screening done every 3 years, beginning at age 27.  For women ages 79-65, health care providers may recommend pelvic exams and Pap testing every 3 years, or they may recommend the Pap and pelvic exam, combined with testing for human papilloma virus (HPV), every 5 years. Some types of HPV increase your risk of cervical cancer. Testing for HPV may also be done on women of any age with unclear Pap test results.  Other health care providers may not recommend any screening for nonpregnant women who are considered low risk for pelvic cancer and who do not have  symptoms. Ask your health care provider if a screening pelvic exam is right for you.  If you have had past treatment for cervical cancer or a condition that could lead to cancer, you need Pap tests and screening for cancer for at least 20 years after your treatment. If Pap tests have been discontinued, your risk factors (such as having a new sexual partner) need to be reassessed to determine if screening should resume. Some women have medical problems that increase the chance of getting cervical cancer. In these cases, your health care provider may recommend more frequent screening and Pap tests. Colorectal Cancer  This type of cancer can be detected and often prevented.  Routine colorectal cancer screening usually begins at 75 years of age and continues through 75 years of age.  Your health care provider may recommend screening at an earlier age if you have risk factors for colon cancer.  Your health care provider may also recommend using home test kits to check for hidden blood in the stool.  A small camera at the end of a tube can be used to examine your colon directly (sigmoidoscopy or colonoscopy). This is done to check for the earliest forms of colorectal cancer.  Routine screening usually begins at age 14.  Direct examination of the colon should be repeated every 5-10 years through 75 years of age. However, you may need to be screened more often if early forms of precancerous polyps or small growths are found. Skin Cancer  Check  your skin from head to toe regularly.  Tell your health care provider about any new moles or changes in moles, especially if there is a change in a mole's shape or color.  Also tell your health care provider if you have a mole that is larger than the size of a pencil eraser.  Always use sunscreen. Apply sunscreen liberally and repeatedly throughout the day.  Protect yourself by wearing long sleeves, pants, a wide-brimmed hat, and sunglasses whenever you are  outside. HEART DISEASE, DIABETES, AND HIGH BLOOD PRESSURE   High blood pressure causes heart disease and increases the risk of stroke. High blood pressure is more likely to develop in:  People who have blood pressure in the high end of the normal range (130-139/85-89 mm Hg).  People who are overweight or obese.  People who are African American.  If you are 94-35 years of age, have your blood pressure checked every 3-5 years. If you are 20 years of age or older, have your blood pressure checked every year. You should have your blood pressure measured twice--once when you are at a hospital or clinic, and once when you are not at a hospital or clinic. Record the average of the two measurements. To check your blood pressure when you are not at a hospital or clinic, you can use:  An automated blood pressure machine at a pharmacy.  A home blood pressure monitor.  If you are between 36 years and 54 years old, ask your health care provider if you should take aspirin to prevent strokes.  Have regular diabetes screenings. This involves taking a blood sample to check your fasting blood sugar level.  If you are at a normal weight and have a low risk for diabetes, have this test once every three years after 75 years of age.  If you are overweight and have a high risk for diabetes, consider being tested at a younger age or more often. PREVENTING INFECTION  Hepatitis B  If you have a higher risk for hepatitis B, you should be screened for this virus. You are considered at high risk for hepatitis B if:  You were born in a country where hepatitis B is common. Ask your health care provider which countries are considered high risk.  Your parents were born in a high-risk country, and you have not been immunized against hepatitis B (hepatitis B vaccine).  You have HIV or AIDS.  You use needles to inject street drugs.  You live with someone who has hepatitis B.  You have had sex with someone who has  hepatitis B.  You get hemodialysis treatment.  You take certain medicines for conditions, including cancer, organ transplantation, and autoimmune conditions. Hepatitis C  Blood testing is recommended for:  Everyone born from [birth year] through 1965.  Anyone with known risk factors for hepatitis C. Sexually transmitted infections (STIs)  You should be screened for sexually transmitted infections (STIs) including gonorrhea and chlamydia if:  You are sexually active and are younger than 75 years of age.  You are older than 75 years of age and your health care provider tells you that you are at risk for this type of infection.  Your sexual activity has changed since you were last screened and you are at an increased risk for chlamydia or gonorrhea. Ask your health care provider if you are at risk.  If you do not have HIV, but are at risk, it may be recommended that you take a prescription medicine daily  to prevent HIV infection. This is called pre-exposure prophylaxis (PrEP). You are considered at risk if:  You are sexually active and do not regularly use condoms or know the HIV status of your partner(s).  You take drugs by injection.  You are sexually active with a partner who has HIV. Talk with your health care provider about whether you are at high risk of being infected with HIV. If you choose to begin PrEP, you should first be tested for HIV. You should then be tested every 3 months for as long as you are taking PrEP.  PREGNANCY   If you are premenopausal and you may become pregnant, ask your health care provider about preconception counseling.  If you may become pregnant, take 400 to 800 micrograms (mcg) of folic acid every day.  If you want to prevent pregnancy, talk to your health care provider about birth control (contraception). OSTEOPOROSIS AND MENOPAUSE   Osteoporosis is a disease in which the bones lose minerals and strength with aging. This can result in serious bone  fractures. Your risk for osteoporosis can be identified using a bone density scan.  If you are 68 years of age or older, or if you are at risk for osteoporosis and fractures, ask your health care provider if you should be screened.  Ask your health care provider whether you should take a calcium or vitamin D supplement to lower your risk for osteoporosis.  Menopause may have certain physical symptoms and risks.  Hormone replacement therapy may reduce some of these symptoms and risks. Talk to your health care provider about whether hormone replacement therapy is right for you.  HOME CARE INSTRUCTIONS   Schedule regular health, dental, and eye exams.  Stay current with your immunizations.   Do not use any tobacco products including cigarettes, chewing tobacco, or electronic cigarettes.  If you are pregnant, do not drink alcohol.  If you are breastfeeding, limit how much and how often you drink alcohol.  Limit alcohol intake to no more than 1 drink per day for nonpregnant women. One drink equals 12 ounces of beer, 5 ounces of wine, or 1 ounces of hard liquor.  Do not use street drugs.  Do not share needles.  Ask your health care provider for help if you need support or information about quitting drugs.  Tell your health care provider if you often feel depressed.  Tell your health care provider if you have ever been abused or do not feel safe at home.   This information is not intended to replace advice given to you by your health care provider. Make sure you discuss any questions you have with your health care provider.   Document Released: 07/02/2011 Document Revised: 01/07/2015 Document Reviewed: 11/18/2013 Elsevier Interactive Patient Education [employer].

## 2016-10-18 NOTE — Assessment & Plan Note (Signed)
Discussed lipid panel results and plan to improve by diet and exercise changes.

## 2016-10-29 ENCOUNTER — Telehealth: Payer: Self-pay

## 2016-10-29 NOTE — Telephone Encounter (Signed)
Patients next prolia injection is due either on/after 11/23/16---I am requesting insurance verification and will be calling patient after I receive findings---can talk with Wendi Lastra if any questions

## 2016-11-07 ENCOUNTER — Telehealth: Payer: Self-pay

## 2016-11-07 NOTE — Telephone Encounter (Signed)
Talked with patient, prolia injection insurance has been verified, estimated $90 copay due, patient has scheduled nurse visit

## 2016-11-26 ENCOUNTER — Ambulatory Visit (INDEPENDENT_AMBULATORY_CARE_PROVIDER_SITE_OTHER): Payer: Medicare Other

## 2016-11-26 ENCOUNTER — Other Ambulatory Visit: Payer: Medicare Other

## 2016-11-26 DIAGNOSIS — M810 Age-related osteoporosis without current pathological fracture: Secondary | ICD-10-CM

## 2016-11-26 MED ORDER — DENOSUMAB 60 MG/ML ~~LOC~~ SOLN
60.0000 mg | Freq: Once | SUBCUTANEOUS | Status: AC
  Administered 2016-11-26: 60 mg via SUBCUTANEOUS

## 2016-11-26 NOTE — Progress Notes (Signed)
Injection given.   Quamesha Mullet J Tabia Landowski, MD  

## 2016-12-26 ENCOUNTER — Ambulatory Visit (INDEPENDENT_AMBULATORY_CARE_PROVIDER_SITE_OTHER)
Admission: RE | Admit: 2016-12-26 | Discharge: 2016-12-26 | Disposition: A | Discharge status: Home / Self Care | Payer: Medicare Other | Source: Ambulatory Visit | Attending: Internal Medicine | Admitting: Internal Medicine

## 2016-12-26 ENCOUNTER — Encounter: Payer: Self-pay | Admitting: Internal Medicine

## 2016-12-26 ENCOUNTER — Ambulatory Visit (INDEPENDENT_AMBULATORY_CARE_PROVIDER_SITE_OTHER): Payer: Medicare Other | Admitting: Internal Medicine

## 2016-12-26 VITALS — BP 130/74 | HR 89 | Temp 98.5°F | Resp 20 | Wt 126.0 lb

## 2016-12-26 DIAGNOSIS — J4520 Mild intermittent asthma, uncomplicated: Secondary | ICD-10-CM

## 2016-12-26 DIAGNOSIS — I10 Essential (primary) hypertension: Secondary | ICD-10-CM

## 2016-12-26 DIAGNOSIS — R05 Cough: Secondary | ICD-10-CM

## 2016-12-26 DIAGNOSIS — R059 Cough, unspecified: Secondary | ICD-10-CM | POA: Insufficient documentation

## 2016-12-26 MED ORDER — LEVOFLOXACIN 250 MG PO TABS: 250.0000 mg | ORAL_TABLET | Freq: Every day | ORAL | 0 refills | Status: AC

## 2016-12-26 MED ORDER — HYDROCODONE-HOMATROPINE 5-1.5 MG/5ML PO SYRP: 5.0000 mL | ORAL_SOLUTION | Freq: Four times a day (QID) | ORAL | 0 refills | Status: AC | PRN

## 2016-12-26 NOTE — Patient Instructions (Signed)
Please take all new medication as prescribed - the antibiotic, and cough medicine if needed   Please continue all other medications as before, and refills have been done if requested.  Please have the pharmacy call with any other refills you may need.  Please keep your appointments with your specialists as you may have planned  Please go to the XRAY Department in the Basement (go straight as you get off the elevator) for the x-ray testing  You will be contacted by phone if any changes need to be made immediately.  Otherwise, you will receive a letter about your results with an explanation, but please check with MyChart first.  Please remember to sign up for MyChart if you have not done so, as this will be important to you in the future with finding out test results, communicating by private email, and scheduling acute appointments online when needed.    

## 2016-12-26 NOTE — Progress Notes (Signed)
Pre visit review using our clinic review tool, if applicable. No additional management support is needed unless otherwise documented below in the visit note. 

## 2016-12-26 NOTE — Assessment & Plan Note (Signed)
Mild to mod, c/w bronchitis vs pna, for antibx course, cough med prn, for cxr r/p pna, to f/u any worsening symptoms or concerns

## 2016-12-26 NOTE — Assessment & Plan Note (Signed)
stable overall by history and exam, recent data reviewed with pt, and pt to continue medical treatment as before,  to f/u any worsening symptoms or concerns BP Readings from Last 3 Encounters:  12/26/16 130/74  10/18/16 120/60  10/11/16 130/78

## 2016-12-26 NOTE — Assessment & Plan Note (Signed)
stable overall by history and exam, recent data reviewed with pt, and pt to continue medical treatment as before,  to f/u any worsening symptoms or concerns @LASTSAO2(3)@  

## 2016-12-26 NOTE — Progress Notes (Signed)
Subjective:    Patient ID: Regina Ramsey, female    DOB: [birth year], 75 y.o.   MRN: [id number]  HPI   Here with 2-3 days acute onset fever, facial pain, pressure, headache, general weakness and malaise, and greenish d/c, and now with worsening mod ST and more prod cough, but pt denies chest pain, wheezing, increased sob or doe, orthopnea, PND, increased LE swelling, palpitations, dizziness or syncope.  Past Medical History:  Diagnosis Date  . ALLERGIC RHINITIS   . ANEMIA-NOS   . Anxiety   . Asthma    Triggered by allergies  . COPD    normal PFTs 06/22/11  . Diverticulosis of colon   . Dyspareunia   . Endometriosis   . GERD   . HEARING LOSS 07/13/2010  . HYPERLIPIDEMIA   . HYPERTENSION   . Irritable bowel syndrome   . MIGRAINE HEADACHE   . OSTEOPOROSIS    on Prolia q 17mo, hx vertebral fx  . PALPITATIONS, HX OF   . Vaginal atrophy    Past Surgical History:  Procedure Laterality Date  . ABDOMINAL HYSTERECTOMY  1974  . APPENDECTOMY  1958  . BREAST SURGERY  1985   breast biopsy    reports that she quit smoking about 28 years ago. She has never used smokeless tobacco. She reports that she drinks alcohol. She reports that she does not use drugs. family history includes Arthritis in her father and mother; Bone cancer in her mother; Bradycardia in her son; Breast cancer in her maternal aunt and mother; Cancer in her brother and mother; Diabetes in her other; Heart Problems in her daughter; Hyperlipidemia in her maternal grandmother. No Known Allergies Current Outpatient Prescriptions on File Prior to Visit  Medication Sig Dispense Refill  . albuterol (PROAIR HFA) 108 (90 Base) MCG/ACT inhaler INHALE 2 PUFFS BY MOUTH EVERY 6 HOURS AS NEEDED FOR WHEEZING 8.5 g 8  . atorvastatin (LIPITOR) 20 MG tablet Take 1 tablet (20 mg total) by mouth daily. 90 tablet 3  . benzonatate (TESSALON) 200 MG capsule Take 1 capsule (200 mg total) by mouth 3 (three) times daily as needed for cough. 60  capsule 0  . fexofenadine (ALLEGRA ALLERGY) 60 MG tablet Take 1 tablet (60 mg total) by mouth 2 (two) times daily. 30 tablet 12  . Loperamide HCl (IMODIUM PO) Take 1 tablet by mouth as needed.    Marland Kitchen losartan-hydrochlorothiazide (HYZAAR) 50-12.5 MG tablet Take 1 tablet by mouth daily. 90 tablet 3  . traMADol (ULTRAM) 50 MG tablet TAKE 1/2 TABLET BY MOUTH EVERY 8 HOURS AS NEEDED FOR PAIN 30 tablet 0  . vitamin B-12 (CYANOCOBALAMIN) 1000 MCG tablet Take 1,000 mcg by mouth daily.     No current facility-administered medications on file prior to visit.    Review of Systems  Constitutional: Negative for unusual diaphoresis or night sweats HENT: Negative for ear swelling or discharge Eyes: Negative for worsening visual haziness  Respiratory: Negative for choking and stridor.   Gastrointestinal: Negative for distension or worsening eructation Genitourinary: Negative for retention or change in urine volume.  Musculoskeletal: Negative for other MSK pain or swelling Skin: Negative for color change and worsening wound Neurological: Negative for tremors and numbness other than noted  Psychiatric/Behavioral: Negative for decreased concentration or agitation other than above   All other system neg     Objective:   Physical Exam BP 130/74   Pulse 89   Temp 98.5 F (36.9 C) (Oral)   Resp 20  Wt 126 lb (57.2 kg)   SpO2 97%   BMI 26.33 kg/m  VS noted,  Constitutional: Pt appears in no apparent distress HENT: Head: NCAT.  Right Ear: External ear normal.  Left Ear: External ear normal.  Eyes: . Pupils are equal, round, and reactive to light. Conjunctivae and EOM are normal Neck: Normal range of motion. Neck supple.  Cardiovascular: Normal rate and regular rhythm.   Pulmonary/Chest: Effort normal and breath sounds without rales but no wheezing.  Neurological: Pt is alert. Not confused , motor grossly intact Skin: Skin is warm. No rash, no LE edema Psychiatric: Pt behavior is normal. No  agitation.  No other new exam findings  Flu test : neg (POCT)    Assessment & Plan:

## 2017-04-10 ENCOUNTER — Encounter: Payer: Medicare Other | Admitting: Internal Medicine

## 2017-04-27 ENCOUNTER — Other Ambulatory Visit: Payer: Self-pay | Admitting: Internal Medicine

## 2017-05-15 ENCOUNTER — Encounter: Payer: Self-pay | Admitting: Gynecology

## 2017-05-16 ENCOUNTER — Other Ambulatory Visit: Payer: Self-pay | Admitting: Internal Medicine

## 2017-05-28 ENCOUNTER — Telehealth: Payer: Self-pay

## 2017-05-28 ENCOUNTER — Ambulatory Visit (INDEPENDENT_AMBULATORY_CARE_PROVIDER_SITE_OTHER): Payer: Medicare Other

## 2017-05-28 DIAGNOSIS — M810 Age-related osteoporosis without current pathological fracture: Secondary | ICD-10-CM

## 2017-05-28 MED ORDER — DENOSUMAB 60 MG/ML ~~LOC~~ SOLN
60.0000 mg | Freq: Once | SUBCUTANEOUS | Status: AC
  Administered 2017-05-28: 60 mg via SUBCUTANEOUS

## 2017-05-28 NOTE — Progress Notes (Signed)
prolia Injection given.   Sheridan Gettel J Zuleyma Scharf, MD  

## 2017-05-28 NOTE — Telephone Encounter (Signed)
Patient came in for prolia injection today( nurse visit)---and she waS wanting to get your thoughts on trying viberzi for possible IBS---lots of problems with diarrhea and she's already been seen by GI----pleaSe advise, I will call patient back, thanks

## 2017-05-28 NOTE — Telephone Encounter (Signed)
She should come in for an appt.  She last saw GI 2016

## 2017-05-29 NOTE — Telephone Encounter (Signed)
Patient has made appt for June 4th

## 2017-06-02 NOTE — Progress Notes (Signed)
Subjective:    Patient ID: Regina Ramsey, female    DOB: 1941-05-19, 76 y.o.   MRN: [id number]  HPI The patient is here for follow up.  IBS with diarrhea:  She has had IBS- D for years.  She has saw GI just over two years ago. She typically has diarrhea 3 times a day.  She takes imodium once a week and this keeps it fairly controlled.  She has some urgency and incontinence at times.  She denies blood in the stool.  She does not have abdominal pain or cramping.   She is eating less and that has helped.  She is not taking benefiber.  Certain foods do trigger her symptoms - salad dressings, etc.    She has tried xifaxan in the past (2 years ago) and does not recall taking it.  There is a note saying her diarrhea resolved after taking it. She was also taking benefiber at the same time.   Her last colonoscopy was 02/10/2015.  She had a polyp removed, but it was otherwise normal.  She is due for a repeat scop in 2021.   She was interested in [place] as well.  She drinks alcohol, but not daily.  She denies removal of her GB or history of pancreatitis. She would consider taking the viberzi if the xifaxan does not work.     She is not exercising regularly.    Medications and allergies reviewed with patient and updated if appropriate.  Patient Active Problem List   Diagnosis Date Noted  . Cough 12/26/2016  . Back pain 10/11/2016  . Dyspnea 05/31/2016  . LBBB (left bundle branch block) 05/31/2016  . Prediabetes 04/15/2016  . Palpitations 04/11/2016  . History of colonic polyps 03/07/2015  . Diarrhea 01/06/2015  . Vaginal atrophy 07/23/2014  . Dyspareunia 07/23/2014  . Hx of endometriosis 06/30/2014  . Diverticulosis of colon 06/30/2014  . Osteoporosis 08/16/2010  . Hyperlipidemia 07/13/2010  . ANEMIA-NOS 07/13/2010  . MIGRAINE HEADACHE 07/13/2010  . HEARING LOSS 07/13/2010  . Essential hypertension 07/13/2010  . Allergic rhinitis 07/13/2010  . Asthma 07/13/2010  . GERD  07/13/2010    Current Outpatient Prescriptions on File Prior to Visit  Medication Sig Dispense Refill  . albuterol (PROAIR HFA) 108 (90 Base) MCG/ACT inhaler INHALE 2 PUFFS BY MOUTH EVERY 6 HOURS AS NEEDED FOR WHEEZING 8.5 g 8  . atorvastatin (LIPITOR) 20 MG tablet Take 1 tablet (20 mg total) by mouth daily. --- Office visit needed for further refills 90 tablet 0  . benzonatate (TESSALON) 200 MG capsule Take 1 capsule (200 mg total) by mouth 3 (three) times daily as needed for cough. 60 capsule 0  . fexofenadine (ALLEGRA ALLERGY) 60 MG tablet Take 1 tablet (60 mg total) by mouth 2 (two) times daily. 30 tablet 12  . Loperamide HCl (IMODIUM PO) Take 1 tablet by mouth as needed.    Marland Kitchen losartan-hydrochlorothiazide (HYZAAR) 50-12.5 MG tablet Take 1 tablet by mouth daily. -- Office visit needed for further refills 90 tablet 0  . traMADol (ULTRAM) 50 MG tablet TAKE 1/2 TABLET BY MOUTH EVERY 8 HOURS AS NEEDED FOR PAIN 30 tablet 0  . vitamin B-12 (CYANOCOBALAMIN) 1000 MCG tablet Take 1,000 mcg by mouth daily.     No current facility-administered medications on file prior to visit.     Past Medical History:  Diagnosis Date  . ALLERGIC RHINITIS   . ANEMIA-NOS   . Anxiety   . Asthma  Triggered by allergies  . COPD    normal PFTs 06/22/11  . Diverticulosis of colon   . Dyspareunia   . Endometriosis   . GERD   . HEARING LOSS 07/13/2010  . HYPERLIPIDEMIA   . HYPERTENSION   . Irritable bowel syndrome   . MIGRAINE HEADACHE   . OSTEOPOROSIS    on Prolia q 7mo hx vertebral fx  . PALPITATIONS, HX OF   . Vaginal atrophy     Past Surgical History:  Procedure Laterality Date  . ABDOMINAL HYSTERECTOMY  1974  . APPENDECTOMY  1958  . BREAST SURGERY  1985   breast biopsy    Social History   Social History  . Marital status: Married    Spouse name: N/A  . Number of children: 2  . Years of education: N/A   Occupational History  . Retired    Social History Main Topics  . Smoking  status: Former Smoker    Quit date: 12/31/1988  . Smokeless tobacco: Never Used     Comment: Married, lives with souse. 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 Alcohol use 0.0 oz/week     Comment: Social  . Drug use: No  . Sexual activity: Not on file   Other Topics Concern  . Not on file   Social History Narrative   Retired tPharmacist, hospital     No regular exercise, active      epworth sleepiness scale = 10 (05/30/16)    Family History  Problem Relation Age of Onset  . Arthritis Mother   . Breast cancer Mother   . Cancer Mother        started on her spine met cancer  . Bone cancer Mother   . Arthritis Father   . Breast cancer Maternal Aunt   . Diabetes Other        parent & grandparent  . Hyperlipidemia Maternal Grandmother   . Bradycardia Son   . Heart Problems Daughter        enlarged heart  . Cancer Brother   . Colon cancer Neg Hx   . Colon polyps Neg Hx     Review of Systems  Constitutional: Negative for chills and fever.  Respiratory: Positive for shortness of breath (with minimal exertion - intermittent). Negative for cough and wheezing.   Cardiovascular: Negative for chest pain and palpitations.  Gastrointestinal: Positive for diarrhea. Negative for abdominal pain, blood in stool and nausea.       Gerd daily       Objective:   Vitals:   06/03/17 1359  BP: 114/70  Pulse: 81  Resp: 16  Temp: 98.1 F (36.7 C)   Wt Readings from Last 3 Encounters:  06/03/17 126 lb (57.2 kg)  12/26/16 126 lb (57.2 kg)  10/18/16 125 lb 0.6 oz (56.7 kg)   Body mass index is 26.33 kg/m.   Physical Exam    Constitutional: Appears well-developed and well-nourished. No distress.  HENT:  Head: Normocephalic and atraumatic.  Neck: Neck supple. No tracheal deviation present. No thyromegaly present.  No cervical lymphadenopathy Cardiovascular: Normal rate, regular rhythm and normal heart sounds.   No murmur heard. No carotid bruit .  No edema Pulmonary/Chest:  Effort normal and breath sounds normal. No respiratory distress. No has no wheezes. No rales.  Abdomen: soft, non distended, mild tenderness diffusely w/o rebound or guarding Skin: Skin is warm and dry. Not diaphoretic.  Psychiatric: Normal mood and affect. Behavior is normal.  Assessment & Plan:    See Problem List for Assessment and Plan of chronic medical problems.

## 2017-06-03 ENCOUNTER — Encounter: Payer: Self-pay | Admitting: Internal Medicine

## 2017-06-03 ENCOUNTER — Ambulatory Visit (INDEPENDENT_AMBULATORY_CARE_PROVIDER_SITE_OTHER): Payer: Medicare Other | Admitting: Internal Medicine

## 2017-06-03 DIAGNOSIS — K580 Irritable bowel syndrome with diarrhea: Secondary | ICD-10-CM

## 2017-06-03 DIAGNOSIS — K589 Irritable bowel syndrome without diarrhea: Secondary | ICD-10-CM | POA: Insufficient documentation

## 2017-06-03 MED ORDER — CALCIUM CARBONATE ANTACID 500 MG PO CHEW: 1.0000 | CHEWABLE_TABLET | Freq: Every day | ORAL | Status: DC

## 2017-06-03 MED ORDER — RIFAXIMIN 550 MG PO TABS: 550.0000 mg | ORAL_TABLET | Freq: Three times a day (TID) | ORAL | 0 refills | Status: DC

## 2017-06-03 MED ORDER — GLUCOSAMINE-CHONDROITIN 500-400 MG PO TABS: 1.0000 | ORAL_TABLET | Freq: Three times a day (TID) | ORAL | Status: DC

## 2017-06-03 NOTE — Patient Instructions (Addendum)
Take the below medication as prescribed.   Rifaximin tablets What is this medicine? RIFAXIMIN (ri FAX i men) is an antibiotic. It is used to treat traveler's diarrhea and irritable bowel syndrome with diarrhea. It is also used to prevent hepatic encephalopathy, a brain disorder that can occur due to severe liver disease. This medicine may be used for other purposes; ask your health care provider or pharmacist if you have questions. COMMON BRAND NAME(S): Xifaxan What should I tell my health care provider before I take this medicine? They need to know if you have any of these conditions: -bloody or tarry stools -fever -liver disease -an unusual or allergic reaction to rifaximin, rifampin, rifabutin, other medicines, foods, dyes, or preservatives -pregnant or trying to get pregnant -breast-feeding How should I use this medicine? Take this medicine by mouth with a glass of water. Follow the directions on the prescription label. This medicine may be taken with or without food. Take your medicine at regular intervals. Do not take your medicine more often than directed. Take all of your medicine as directed even if you think your are better. Do not skip doses or stop your medicine early. Talk to your pediatrician regarding the use of this medicine in children. Special care may be needed. Overdosage: If you think you have taken too much of this medicine contact a poison control center or emergency room at once. NOTE: This medicine is only for you. Do not share this medicine with others. What if I miss a dose? If you miss a dose, take it as soon as you can. If it is almost time for your next dose, take only that dose. Do not take double or extra doses. What may interact with this medicine? This medicine may interact with the following medications: -birth control pills -cyclosporine -midazolam -verapamil -warfarin This list may not describe all possible interactions. Give your health care provider  a list of all the medicines, herbs, non-prescription drugs, or dietary supplements you use. Also tell them if you smoke, drink alcohol, or use illegal drugs. Some items may interact with your medicine. What should I watch for while using this medicine? Tell your doctor or healthcare professional if your symptoms do not start to get better or if they get worse. Do not treat diarrhea with over the counter products. Contact your doctor if you have diarrhea that lasts more than 2 days or if it is severe and watery. What side effects may I notice from receiving this medicine? Side effects that you should report to your doctor or health care professional as soon as possible: -allergic reactions like skin rash, itching or hives, swelling of the face, lips, or tongue -blood in the urine -bloody or watery diarrhea -fever Side effects that usually do not require medical attention (report to your doctor or health care professional if they continue or are bothersome): -constipation -headache -nausea, vomiting -stomach bloating, gas -urgent bowel movements This list may not describe all possible side effects. Call your doctor for medical advice about side effects. You may report side effects to FDA at 1-800-FDA-1088. Where should I keep my medicine? Keep out of the reach of children. Store at room temperature between 15 and 30 degrees C (59 and 86 degrees F). Throw away any unused medicine after the expiration date. NOTE: This sheet is a summary. It may not cover all possible information. If you have questions about this medicine, talk to your doctor, pharmacist, or health care provider.  2018 Elsevier/Gold Standard (2016-12-19 10:31:29)

## 2017-06-03 NOTE — Assessment & Plan Note (Signed)
Having diarrhea x 3 times per day  Taking imodium once a week which helps No blood in the stool Has taken xifaxan in the past and it did help Advised starting benefiber If xifaxan does not work will retry or try viberzi Consider GI referral if needed

## 2017-06-10 ENCOUNTER — Telehealth: Payer: Self-pay | Admitting: Internal Medicine

## 2017-06-10 NOTE — Telephone Encounter (Signed)
Called again regarding this,  Phone Number is [phone] [facility]

## 2017-06-10 NOTE — Telephone Encounter (Signed)
Pt called in wanted to check the status of PA on her  rifaximin (XIFAXAN) 550 MG TABS tablet [[id number]]  That was called in

## 2017-06-11 NOTE — Telephone Encounter (Signed)
Received approve for Xifaxan through 06/25/2017. Pharmacy notified, unable to LVM with pt.

## 2017-06-11 NOTE — Telephone Encounter (Signed)
PA has been completed and awaiting response from [facility]. Key [id number]. Pt is aware.

## 2017-07-25 ENCOUNTER — Other Ambulatory Visit: Payer: Self-pay | Admitting: Internal Medicine

## 2017-08-12 ENCOUNTER — Other Ambulatory Visit: Payer: Self-pay | Admitting: Internal Medicine

## 2017-11-19 ENCOUNTER — Telehealth: Payer: Self-pay

## 2017-11-19 NOTE — Telephone Encounter (Signed)
Patient advised that her next prolia injection is due either on/after December 02, 2017---estimated $90 copay---patient wants to call back to schedule nurse visit---can talk with Lilinoe Acklin if any further questions

## 2017-12-03 ENCOUNTER — Ambulatory Visit (INDEPENDENT_AMBULATORY_CARE_PROVIDER_SITE_OTHER): Payer: Medicare Other

## 2017-12-03 ENCOUNTER — Ambulatory Visit: Payer: Medicare Other | Admitting: Internal Medicine

## 2017-12-03 DIAGNOSIS — M810 Age-related osteoporosis without current pathological fracture: Secondary | ICD-10-CM

## 2017-12-03 MED ORDER — DENOSUMAB 60 MG/ML ~~LOC~~ SOLN
60.0000 mg | Freq: Once | SUBCUTANEOUS | Status: AC
  Administered 2017-12-03: 60 mg via SUBCUTANEOUS

## 2017-12-05 NOTE — Progress Notes (Signed)
prolia Injection given.   Stacy J Burns, MD  

## 2017-12-22 ENCOUNTER — Other Ambulatory Visit: Payer: Self-pay | Admitting: Internal Medicine

## 2018-02-26 ENCOUNTER — Other Ambulatory Visit: Payer: Self-pay | Admitting: Internal Medicine

## 2018-02-27 ENCOUNTER — Other Ambulatory Visit: Payer: Self-pay | Admitting: Internal Medicine

## 2018-03-18 ENCOUNTER — Other Ambulatory Visit: Payer: Self-pay | Admitting: Internal Medicine

## 2018-03-19 ENCOUNTER — Telehealth: Payer: Self-pay | Admitting: Internal Medicine

## 2018-03-19 NOTE — Telephone Encounter (Signed)
We can change to a similar medication that is not effective we will find a recall  Medication pending if she agrees.  Let her know she will need to monitor her blood pressure to make sure it is controlled with the new medication

## 2018-03-19 NOTE — Telephone Encounter (Signed)
Copied from [place] [phone]. Topic: General - Other >> Mar 19, 2018  2:26 PM Darl Householder, RMA wrote: Reason for CRM: Patient is requesting a call back from Dr. [name] or CMA concerning medication losartan-hydrochlorothiazide (HYZAAR) 50-12.5 MG tablet being on recall and replacement, please return pt call

## 2018-03-20 NOTE — Telephone Encounter (Signed)
LVM informing pt. Will send RX to pharmacy once pt calls back.

## 2018-03-21 ENCOUNTER — Telehealth: Payer: Self-pay | Admitting: Internal Medicine

## 2018-03-21 NOTE — Telephone Encounter (Signed)
Copied from [place] [id number]. Topic: Quick Communication - Rx Refill/Question >> Mar 21, 2018  3:40 PM Oliver Pila B wrote: Pt called to state she would like the replacement medicine for losartan called in, call pt if needed

## 2018-03-21 NOTE — Telephone Encounter (Signed)
Patient is requesting a different medication- change for losartan.

## 2018-03-24 MED ORDER — TELMISARTAN-HCTZ 40-12.5 MG PO TABS: 1.0000 | ORAL_TABLET | Freq: Every day | ORAL | 5 refills | Status: DC

## 2018-04-01 ENCOUNTER — Ambulatory Visit: Payer: Medicare Other | Admitting: Family Medicine

## 2018-04-01 ENCOUNTER — Other Ambulatory Visit: Payer: Self-pay | Admitting: Internal Medicine

## 2018-04-01 ENCOUNTER — Encounter: Payer: Self-pay | Admitting: Family Medicine

## 2018-04-01 VITALS — BP 146/82 | HR 90 | Temp 97.9°F | Ht <= 58 in | Wt 132.0 lb

## 2018-04-01 DIAGNOSIS — M545 Low back pain: Secondary | ICD-10-CM

## 2018-04-01 DIAGNOSIS — G8929 Other chronic pain: Secondary | ICD-10-CM

## 2018-04-01 MED ORDER — PREDNISONE 5 MG PO TABS: ORAL_TABLET | ORAL | 0 refills | Status: DC

## 2018-04-01 NOTE — Progress Notes (Signed)
Regina Ramsey - 77 y.o. female MRN [id number]  Date of birth: [birth year]  SUBJECTIVE:  Including CC & ROS.  Chief Complaint  Patient presents with  . Back Pain    Regina Ramsey is a 77 y.o. female that is presenting with back pain. Pain is ongoing for one week. She notices the pain after sitting. Located  In her lower back near her tailbone. Pain is mild to severe, constant. Denies tingling or numbness. She noticed the pain after she lifted and carried her grandson down the stairs. She has been taking tramadol for the pain. Denies any radicular symptoms. She has a history of back pain but this seems different.   Review of Dexa scan from 2017 shows Osteoporosis   Review of Systems  Constitutional: Negative for fever.  HENT: Negative for congestion.   Respiratory: Negative for cough.   Cardiovascular: Negative for chest pain.  Gastrointestinal: Negative for abdominal pain.  Musculoskeletal: Positive for back pain.  Skin: Negative for color change.  Neurological: Negative for weakness.  Hematological: Negative for adenopathy.  Psychiatric/Behavioral: Negative for agitation.    HISTORY: Past Medical, Surgical, Social, and Family History Reviewed & Updated per EMR.   Pertinent Historical Findings include:  Past Medical History:  Diagnosis Date  . ALLERGIC RHINITIS   . ANEMIA-NOS   . Anxiety   . Asthma    Triggered by allergies  . COPD    normal PFTs 06/22/11  . Diverticulosis of colon   . Dyspareunia   . Endometriosis   . GERD   . HEARING LOSS 07/13/2010  . HYPERLIPIDEMIA   . HYPERTENSION   . Irritable bowel syndrome   . MIGRAINE HEADACHE   . OSTEOPOROSIS    on Prolia q 54mo hx vertebral fx  . PALPITATIONS, HX OF   . Vaginal atrophy     Past Surgical History:  Procedure Laterality Date  . ABDOMINAL HYSTERECTOMY  1974  . APPENDECTOMY  1958  . BREAST SURGERY  1985   breast biopsy    No Known Allergies  Family History  Problem Relation Age of Onset  .  Arthritis Mother   . Breast cancer Mother   . Cancer Mother        started on her spine met cancer  . Bone cancer Mother   . Arthritis Father   . Breast cancer Maternal Aunt   . Diabetes Other        parent & grandparent  . Hyperlipidemia Maternal Grandmother   . Bradycardia Son   . Heart Problems Daughter        enlarged heart  . Cancer Brother   . Colon cancer Neg Hx   . Colon polyps Neg Hx      Social History   Socioeconomic History  . Marital status: Married    Spouse name: Not on file  . Number of children: 2  . Years of education: Not on file  . Highest education level: Not on file  Occupational History  . Occupation: Retired  SScientific laboratory technician . Financial resource strain: Not on file  . Food insecurity:    Worry: Not on file    Inability: Not on file  . Transportation needs:    Medical: Not on file    Non-medical: Not on file  Tobacco Use  . Smoking status: Former Smoker    Last attempt to quit: 12/31/1988    Years since quitting: 29.2  . Smokeless tobacco: Never Used  . Tobacco comment: Married, lives  with souse. Master of education-specialist needs for grade school  Substance and Sexual Activity  . Alcohol use: Yes    Alcohol/week: 0.0 oz    Comment: Social  . Drug use: No  . Sexual activity: Not on file  Lifestyle  . Physical activity:    Days per week: Not on file    Minutes per session: Not on file  . Stress: Not on file  Relationships  . Social connections:    Talks on phone: Not on file    Gets together: Not on file    Attends religious service: Not on file    Active member of club or organization: Not on file    Attends meetings of clubs or organizations: Not on file    Relationship status: Not on file  . Intimate partner violence:    Fear of current or ex partner: Not on file    Emotionally abused: Not on file    Physically abused: Not on file    Forced sexual activity: Not on file  Other Topics Concern  . Not on file  Social History  Narrative   Retired Pharmacist, hospital      No regular exercise, active      epworth sleepiness scale = 10 (05/30/16)     PHYSICAL EXAM:  VS: BP (!) 146/82 (BP Location: Left Arm, Patient Position: Sitting, Cuff Size: Normal)   Pulse 90   Temp 97.9 F (36.6 C) (Oral)   Ht 4' 10"  (1.473 m)   Wt 132 lb (59.9 kg)   SpO2 96%   BMI 27.59 kg/m  Physical Exam Gen: NAD, alert, cooperative with exam,  ENT: normal lips, normal nasal mucosa,  Eye: normal EOM, normal conjunctiva and lids CV:  no edema, +2 pedal pulses   Resp: no accessory muscle use, non-labored,  Skin: no rashes, no areas of induration  Neuro: normal tone, normal sensation to touch Psych:  normal insight, alert and oriented MSK:  Back Exam:  Holding herself in a rigid upright position   Mild TTP of lower lumbar region b/l  Normal IR and ER of the hips Normal strength to resistance with hip flexion  Leg strength: Quad: 5/5 Hamstring: 5/5 Hip flexor: 5/5 Gait unremarkable. Pain with SLR b/l  Normal gait  Neurovascularly intact       ASSESSMENT & PLAN:   Low back pain Acute on chronic in nature. Concern for compression fracture with history of osteoporosis. Possible for muscular origin given the mechanism. Did not tolerate straight leg raise but no radicular symptoms.  - lumbar xrays  - prednisone  - counseled on HEP  - if no improvement would consider PT

## 2018-04-01 NOTE — Patient Instructions (Signed)
Please try to alternate heat and ice. 20 minutes at a time  Please try tylenol  Please try the exercises if your pain is less than 2/10  We will call you with the results from today  Please follow up with me in 3-4 weeks if there is no improvement.

## 2018-04-02 NOTE — Assessment & Plan Note (Signed)
Acute on chronic in nature. Concern for compression fracture with history of osteoporosis. Possible for muscular origin given the mechanism. Did not tolerate straight leg raise but no radicular symptoms.  - lumbar xrays  - prednisone  - counseled on HEP  - if no improvement would consider PT

## 2018-04-03 ENCOUNTER — Ambulatory Visit (INDEPENDENT_AMBULATORY_CARE_PROVIDER_SITE_OTHER)
Admission: RE | Admit: 2018-04-03 | Discharge: 2018-04-03 | Disposition: A | Discharge status: Home / Self Care | Payer: Medicare Other | Source: Ambulatory Visit | Attending: Family Medicine | Admitting: Family Medicine

## 2018-04-03 ENCOUNTER — Telehealth: Payer: Self-pay | Admitting: Family Medicine

## 2018-04-03 DIAGNOSIS — M545 Low back pain, unspecified: Secondary | ICD-10-CM

## 2018-04-03 DIAGNOSIS — G8929 Other chronic pain: Secondary | ICD-10-CM

## 2018-04-03 NOTE — Telephone Encounter (Signed)
Informed of xray results.   Rosemarie Ax, MD [facility] 04/03/2018, 4:58 PM

## 2018-04-28 ENCOUNTER — Encounter: Payer: Self-pay | Admitting: Internal Medicine

## 2018-04-28 ENCOUNTER — Ambulatory Visit: Payer: Medicare Other | Admitting: Internal Medicine

## 2018-04-28 VITALS — BP 116/84 | HR 88 | Ht <= 58 in | Wt 131.0 lb

## 2018-04-28 DIAGNOSIS — I10 Essential (primary) hypertension: Secondary | ICD-10-CM

## 2018-04-28 DIAGNOSIS — I447 Left bundle-branch block, unspecified: Secondary | ICD-10-CM | POA: Diagnosis not present

## 2018-04-28 DIAGNOSIS — R0602 Shortness of breath: Secondary | ICD-10-CM

## 2018-04-28 NOTE — Progress Notes (Signed)
OFFICE NOTE  Chief Complaint:  Dyspnea on exertion  Primary Care Physician: Binnie Rail, MD  HPI:  Regina Ramsey is a 77 y.o. female who is kindly referred to me for evaluation of palpitations, dyspnea and chest discomfort. She recently was referred for an exercise stress echocardiogram to evaluate symptoms in the setting of left bundle branch block. According to the patient she's had a left bundle branch block for at least 5 years and has had 2 stress tests in the past, both of which were nuclear stress tests. The first study caused her significant nausea vomiting and diarrhea and she was ill for more than 24 hours. The second study apparently caused her last symptoms but did persist for several hours and was a "new stress agent". She was then referred for an exercise echocardiogram however given her left bundle branch block, a stress portion of the study is not interpretable. That test was not performed and she was then referred to me for evaluation. Symptomatically she reports shortness of breath and occasional palpitations but denies any cardiac chest pain. EKG doesn't fact show left bundle branch block which is stable compared to prior EKGs in the past. She has some cardiac risk factors including hypertension and dyslipidemia but is well controlled.  07/30/2016  Regina Ramsey returns today for follow-up. She underwent an echocardiogram which showed preserved LVEF 65-70% with mild diastolic dysfunction. She reports her shortness of breath is improved somewhat. She noted when going out to [place] state where she was on vacation for a week, that her breathing was significantly better, which she attributed to cooler air temperatures and less allergens in the air. I did not see a cardiac cause of her dyspnea despite her left bundle branch block. I'm also reassured by her negative stress testing in the past. My suspicion is that her shortness of breath is related to  COPD.  04/28/2018  Regina Ramsey returns today for follow-up.  I last saw her in 2017.  At that time we did a work-up for new left bundle branch block.  Her nuclear stress test was negative for ischemia and echo showed normal LV function.  She had a pre-existing diagnosis of COPD, but has not been on treatment for that.  Subsequently she says that her COPD "got better", or that she had pulmonary function testing which apparently was normal.  She does have a remote smoking history.  Recently she has had more progressive dyspnea on exertion over the past several weeks.  She also reports some orthopnea, mild weight gain but no significant edema.  She denies any chest pain.  She has been bothered by worsening seasonal allergies, but doubles up her dose of fexofenadine with improvement in her symptoms.  She denies mucus or productive cough.  Or wheezing.  PMHx:  Past Medical History:  Diagnosis Date  . ALLERGIC RHINITIS   . ANEMIA-NOS   . Anxiety   . Asthma    Triggered by allergies  . COPD    normal PFTs 06/22/11  . Diverticulosis of colon   . Dyspareunia   . Endometriosis   . GERD   . HEARING LOSS 07/13/2010  . HYPERLIPIDEMIA   . HYPERTENSION   . Irritable bowel syndrome   . MIGRAINE HEADACHE   . OSTEOPOROSIS    on Prolia q 12mo hx vertebral fx  . PALPITATIONS, HX OF   . Vaginal atrophy     Past Surgical History:  Procedure Laterality Date  . ABDOMINAL HYSTERECTOMY  [place]  . BREAST SURGERY  1985   breast biopsy    FAMHx:  Family History  Problem Relation Age of Onset  . Arthritis Mother   . Breast cancer Mother   . Cancer Mother        started on her spine met cancer  . Bone cancer Mother   . Arthritis Father   . Breast cancer Maternal Aunt   . Diabetes Other        parent & grandparent  . Hyperlipidemia Maternal Grandmother   . Bradycardia Son   . Heart Problems Daughter        enlarged heart  . Cancer Brother   . Colon cancer Neg Hx   . Colon  polyps Neg Hx     SOCHx:   reports that she quit smoking about 29 years ago. She has never used smokeless tobacco. She reports that she drinks alcohol. She reports that she does not use drugs.  ALLERGIES:  No Known Allergies  ROS: Pertinent items noted in HPI and remainder of comprehensive ROS otherwise negative.  HOME MEDS: Current Outpatient Medications  Medication Sig Dispense Refill  . albuterol (PROAIR HFA) 108 (90 Base) MCG/ACT inhaler INHALE 2 PUFFS BY MOUTH EVERY 6 HOURS AS NEEDED FOR WHEEZING 8.5 g 8  . Alum & Mag Hydroxide-Simeth (MYLANTA PO) 1 Dose as needed.    Marland Kitchen atorvastatin (LIPITOR) 20 MG tablet Take 1 tablet (20 mg total) by mouth daily. -- Office visit needed for further refills 90 tablet 0  . fexofenadine (ALLEGRA ALLERGY) 60 MG tablet Take 1 tablet (60 mg total) by mouth 2 (two) times daily. 30 tablet 12  . glucosamine-chondroitin (MAX GLUCOSAMINE CHONDROITIN) 500-400 MG tablet Take 1 tablet by mouth 3 (three) times daily.    . Loperamide HCl (IMODIUM PO) Take 1 tablet by mouth as needed.    . predniSONE (DELTASONE) 5 MG tablet Take 6 pills for first day, 5 pills second day, 4 pills third day, 3 pills fourth day, 2 pills the fifth day, and 1 pill sixth day. 21 tablet 0  . rifaximin (XIFAXAN) 550 MG TABS tablet Take 1 tablet (550 mg total) by mouth 3 (three) times daily. 42 tablet 0  . telmisartan-hydrochlorothiazide (MICARDIS HCT) 40-12.5 MG tablet Take 1 tablet by mouth daily. 30 tablet 5  . traMADol (ULTRAM) 50 MG tablet TAKE 1/2 TABLET BY MOUTH EVERY 8 HOURS AS NEEDED FOR PAIN 30 tablet 0  . vitamin B-12 (CYANOCOBALAMIN) 1000 MCG tablet Take 1,000 mcg by mouth daily.     No current facility-administered medications for this visit.     LABS/IMAGING: No results found for this or any previous visit (from the past 48 hour(s)). No results found.  WEIGHTS: Wt Readings from Last 3 Encounters:  04/28/18 131 lb (59.4 kg)  04/01/18 132 lb (59.9 kg)  06/03/17 126 lb  (57.2 kg)    VITALS: BP 116/84   Pulse 88   Ht 4' 10"  (1.473 m)   Wt 131 lb (59.4 kg)   BMI 27.38 kg/m   EXAM: General appearance: alert and no distress Neck: no carotid bruit, no JVD and thyroid not enlarged, symmetric, no tenderness/mass/nodules Lungs: diminished breath sounds bilaterally Heart: regular rate and rhythm Abdomen: soft, non-tender; bowel sounds normal; no masses,  no organomegaly Extremities: extremities normal, atraumatic, no cyanosis or edema Pulses: 2+ and symmetric Skin: Skin color, texture, turgor normal. No rashes or lesions Neurologic: Grossly normal Psych: Pleasant  EKG: Normal sinus  rhythm at 88, LBBB-personally reviewed  ASSESSMENT: 1. Progressive Dyspnea - LVEF 65-70% (2017) 2. LBBB 3. Palpitations 4. Hypertension-controlled 5. Dyslipidemia  PLAN: 1.   Regina Ramsey reports progressive dyspnea on exertion.  She also has some orthopnea and mild weight gain but no worsening edema.  I suspect this is most likely related to worsening seasonal allergies or possibly COPD, but she reports no increased productive cough or sputum and that she had had recent PFTs which were "normal".  Certainly with her wide QRS left bundle branch block, she is at risk for developing cardiomyopathy.  I like to repeat an echocardiogram to see if her LVEF is lower, possibly explaining her shortness of breath.  She denies any chest pain and I think that coronary disease is less likely, however, she may need a repeat ischemia evaluation.  Follow-up with me afterwards.  Pixie Casino, MD, [facility], [place] Director of the [facility]  [facility] Diplomate of the American Board of Clinical Lipidology Attending Cardiologist  Direct Dial: [phone]  Fax: [phone]  Website:  [email or web address]   Regina Ramsey 04/28/2018, 5:20 PM

## 2018-04-28 NOTE — Patient Instructions (Signed)
Your physician has requested that you have an echocardiogram @ [address] - [address]. Echocardiography is a painless test that uses sound waves to create images of your heart. It provides your doctor with information about the size and shape of your heart and how well your heart's chambers and valves are working. This procedure takes approximately one hour. There are no restrictions for this procedure.  Your physician recommends that you schedule a follow-up appointment with Dr. Debara Pickett after your test.

## 2018-05-02 ENCOUNTER — Ambulatory Visit: Payer: Medicare Other | Admitting: Internal Medicine

## 2018-05-05 ENCOUNTER — Other Ambulatory Visit: Payer: Self-pay

## 2018-05-05 ENCOUNTER — Ambulatory Visit (HOSPITAL_COMMUNITY): Payer: Medicare Other | Attending: Cardiovascular Disease

## 2018-05-05 DIAGNOSIS — R0602 Shortness of breath: Secondary | ICD-10-CM | POA: Insufficient documentation

## 2018-05-05 DIAGNOSIS — E785 Hyperlipidemia, unspecified: Secondary | ICD-10-CM | POA: Insufficient documentation

## 2018-05-05 DIAGNOSIS — I447 Left bundle-branch block, unspecified: Secondary | ICD-10-CM | POA: Diagnosis not present

## 2018-05-05 DIAGNOSIS — I10 Essential (primary) hypertension: Secondary | ICD-10-CM | POA: Insufficient documentation

## 2018-05-05 DIAGNOSIS — R0600 Dyspnea, unspecified: Secondary | ICD-10-CM | POA: Insufficient documentation

## 2018-05-07 NOTE — Progress Notes (Signed)
Subjective:    Patient ID: Regina Ramsey, female    DOB: July 08, 1941, 77 y.o.   MRN: [id number]  HPI The patient is here for an acute visit and routine follow-up.   SOB:  She recently saw cardiology.  Her heart is ok.  She will be seeing pulmonary.  It is intermittent - sometimes with flat surfaces she will feel short of breath, but other times she will not.  She feels it at rest and with exertion..  She can often go up and down stairs and be ok.  She does not feel it daily or even weekly, the other weeks it can occur on a daily basis.  She denies any coughing, wheezing or cold symptoms.  Prediabetes:  She is compliant with a low sugar/carbohydrate diet.  She is not exercising regularly.  Hyperlipidemia: She is taking her medication daily. She is compliant with a low fat/cholesterol diet. She is not exercising regularly. She denies myalgias.   Hypertension: She is taking her medication daily. She is compliant with a low sodium diet.  She denies chest pain, edema,  and regular headaches. She is not exercising regularly.  She does not monitor her blood pressure at home.    IBS:  She has urgency with increased stress.  She typically has loose stools, not necessarily diarrhea.  We did try Xifaxan last summer.  She has not seen GI in several years.  This does affect her day-to-day life because of concerns of knowing where the bathroom is.  She denies any abdominal cramping or nausea.  B12 deficiency, vitamin D deficiency/osteoporosis: She is currently not taking any supplementation.  She is not exercising regularly.  Medications and allergies reviewed with patient and updated if appropriate.  Patient Active Problem List   Diagnosis Date Noted  . IBS (irritable bowel syndrome) 06/03/2017  . Cough 12/26/2016  . Low back pain 10/11/2016  . Dyspnea 05/31/2016  . LBBB (left bundle branch block) 05/31/2016  . Prediabetes 04/15/2016  . Palpitations 04/11/2016  . History of colonic polyps  03/07/2015  . Diarrhea 01/06/2015  . Vaginal atrophy 07/23/2014  . Dyspareunia 07/23/2014  . Hx of endometriosis 06/30/2014  . Diverticulosis of colon 06/30/2014  . Osteoporosis 08/16/2010  . Hyperlipidemia 07/13/2010  . ANEMIA-NOS 07/13/2010  . MIGRAINE HEADACHE 07/13/2010  . HEARING LOSS 07/13/2010  . Essential hypertension 07/13/2010  . Allergic rhinitis 07/13/2010  . Asthma 07/13/2010  . GERD 07/13/2010    Current Outpatient Medications on File Prior to Visit  Medication Sig Dispense Refill  . albuterol (PROAIR HFA) 108 (90 Base) MCG/ACT inhaler INHALE 2 PUFFS BY MOUTH EVERY 6 HOURS AS NEEDED FOR WHEEZING 8.5 g 8  . Alum & Mag Hydroxide-Simeth (MYLANTA PO) 1 Dose as needed.    Marland Kitchen atorvastatin (LIPITOR) 20 MG tablet Take 1 tablet (20 mg total) by mouth daily. -- Office visit needed for further refills 90 tablet 0  . fexofenadine (ALLEGRA ALLERGY) 60 MG tablet Take 1 tablet (60 mg total) by mouth 2 (two) times daily. 30 tablet 12  . glucosamine-chondroitin (MAX GLUCOSAMINE CHONDROITIN) 500-400 MG tablet Take 1 tablet by mouth 3 (three) times daily.    . Loperamide HCl (IMODIUM PO) Take 1 tablet by mouth as needed.    Marland Kitchen telmisartan-hydrochlorothiazide (MICARDIS HCT) 40-12.5 MG tablet Take 1 tablet by mouth daily. 30 tablet 5  . traMADol (ULTRAM) 50 MG tablet TAKE 1/2 TABLET BY MOUTH EVERY 8 HOURS AS NEEDED FOR PAIN 30 tablet 0  . vitamin  B-12 (CYANOCOBALAMIN) 1000 MCG tablet Take 1,000 mcg by mouth daily.     No current facility-administered medications on file prior to visit.     Past Medical History:  Diagnosis Date  . ALLERGIC RHINITIS   . ANEMIA-NOS   . Anxiety   . Asthma    Triggered by allergies  . COPD    normal PFTs 06/22/11  . Diverticulosis of colon   . Dyspareunia   . Endometriosis   . GERD   . HEARING LOSS 07/13/2010  . HYPERLIPIDEMIA   . HYPERTENSION   . Irritable bowel syndrome   . MIGRAINE HEADACHE   . OSTEOPOROSIS    on Prolia q 71mo hx vertebral fx    . PALPITATIONS, HX OF   . Vaginal atrophy     Past Surgical History:  Procedure Laterality Date  . ABDOMINAL HYSTERECTOMY  1974  . APPENDECTOMY  1958  . BREAST SURGERY  1985   breast biopsy    Social History   Socioeconomic History  . Marital status: Married    Spouse name: Not on file  . Number of children: 2  . Years of education: Not on file  . Highest education level: Not on file  Occupational History  . Occupation: Retired  SScientific laboratory technician . Financial resource strain: Not on file  . Food insecurity:    Worry: Not on file    Inability: Not on file  . Transportation needs:    Medical: Not on file    Non-medical: Not on file  Tobacco Use  . Smoking status: Former Smoker    Last attempt to quit: 12/31/1988    Years since quitting: 29.3  . Smokeless tobacco: Never Used  . Tobacco comment: Married, lives with souse. Master of education-specialist needs for grade school  Substance and Sexual Activity  . Alcohol use: Yes    Alcohol/week: 0.0 oz    Comment: Social  . Drug use: No  . Sexual activity: Not on file  Lifestyle  . Physical activity:    Days per week: Not on file    Minutes per session: Not on file  . Stress: Not on file  Relationships  . Social connections:    Talks on phone: Not on file    Gets together: Not on file    Attends religious service: Not on file    Active member of club or organization: Not on file    Attends meetings of clubs or organizations: Not on file    Relationship status: Not on file  Other Topics Concern  . Not on file  Social History Narrative   Retired tPharmacist, hospital     No regular exercise, active      epworth sleepiness scale = 10 (05/30/16)    Family History  Problem Relation Age of Onset  . Arthritis Mother   . Breast cancer Mother   . Cancer Mother        started on her spine met cancer  . Bone cancer Mother   . Arthritis Father   . Breast cancer Maternal Aunt   . Diabetes Other        parent & grandparent  .  Hyperlipidemia Maternal Grandmother   . Bradycardia Son   . Heart Problems Daughter        enlarged heart  . Cancer Brother   . Colon cancer Neg Hx   . Colon polyps Neg Hx     Review of Systems  Constitutional: Negative for chills and fever.  Respiratory: Positive for shortness of breath (intermittent with exertion or at rest). Negative for cough and wheezing.   Cardiovascular: Positive for palpitations (with SOB). Negative for chest pain and leg swelling.  Gastrointestinal: Negative for abdominal pain and nausea.       GERD.  Urgency, soft stools - typically occurs with stress  Endocrine: Negative for polydipsia and polyuria.  Genitourinary: Positive for frequency. Negative for dysuria and hematuria.  Musculoskeletal: Positive for arthralgias.  Neurological: Positive for light-headedness (rare). Negative for dizziness and headaches.       Objective:   Vitals:   05/08/18 0750  BP: 124/60  Pulse: 85  Resp: 16  Temp: 98.5 F (36.9 C)  SpO2: 96%   BP Readings from Last 3 Encounters:  05/08/18 124/60  04/28/18 116/84  04/01/18 (!) 146/82   Wt Readings from Last 3 Encounters:  05/08/18 132 lb (59.9 kg)  04/28/18 131 lb (59.4 kg)  04/01/18 132 lb (59.9 kg)   Body mass index is 27.59 kg/m.   Physical Exam    Constitutional: Appears well-developed and well-nourished. No distress.  HENT:  Head: Normocephalic and atraumatic.  Neck: Neck supple. No tracheal deviation present. No thyromegaly present.  No cervical lymphadenopathy Cardiovascular: Normal rate, regular rhythm and normal heart sounds.   No murmur heard. No carotid bruit .  No edema Pulmonary/Chest: Effort normal and breath sounds normal. No respiratory distress. No has no wheezes. No rales.  Skin: Skin is warm and dry. Not diaphoretic.  Psychiatric: Normal mood and affect. Behavior is normal.     ECHOCARDIOGRAM COMPLETE                         [facility]                        [address], [place] [address]                            [phone]  ------------------------------------------------------------------- Transthoracic Echocardiography  Patient:    Timberlee, Roblero MR #:       [id number] Study Date: 05/05/2018 Gender:     F Age:        25 Height:     147.3 cm Weight:     59.4 kg BSA:        1.58 m^2 Pt. Status: Room:   ATTENDING    Jenkins Rouge, M.D.  ORDERING     Lyman Bishop MD  REFERRING    Lyman Bishop MD  SONOGRAPHER  Marygrace Drought, RCS  PERFORMING   [facility], Outpatient  cc:  ------------------------------------------------------------------- LV EF: 55% -   60%  ------------------------------------------------------------------- Indications:      LBBB (I44.7).  ------------------------------------------------------------------- History:   PMH:  LBBB, Hyperlipidemia.  Dyspnea.  Risk factors: Hypertension.  ------------------------------------------------------------------- Study Conclusions  - Left ventricle: Abnormal septal motion from LBBB. The cavity size   was normal. Wall thickness was increased in a pattern of mild   LVH. Systolic function was normal. The estimated ejection   fraction was in the range of 55% to 60%. Wall motion was normal;   there were no regional wall motion abnormalities. Doppler   parameters are consistent with both elevated ventricular   end-diastolic filling pressure and elevated  left atrial filling   pressure. - Atrial septum: No defect or patent foramen ovale was identified.  ------------------------------------------------------------------- Study data:  Comparison was made to the study of 06/08/2016.  Study status:  Routine.  Procedure:  The patient reported no pain pre or post test. Transthoracic echocardiography. Image quality was adequate.          Transthoracic echocardiography. M-mode, complete 2D,3D, spectral Doppler, and color Doppler.  Birthdate:   Patient birthdate: Mar 14, 1941.  Age:  Patient is 77 yr old.  Sex:  Gender: female.    BMI: 27.4 kg/m^2.  Blood pressure:     116/84  Patient status:  Outpatient.  Study date:  Study date: 05/05/2018. Study time: 10:38 AM.  Location:  [facility]  -------------------------------------------------------------------  ------------------------------------------------------------------- Left ventricle:  Abnormal septal motion from LBBB. The cavity size was normal. Wall thickness was increased in a pattern of mild LVH. Systolic function was normal. The estimated ejection fraction was in the range of 55% to 60%. Wall motion was normal; there were no regional wall motion abnormalities. Doppler parameters are consistent with both elevated ventricular end-diastolic filling pressure and elevated left atrial filling pressure.  ------------------------------------------------------------------- Aortic valve:   Structurally normal valve. Trileaflet; normal thickness leaflets. Cusp separation was normal. Mobility was not restricted.  Doppler:  Transvalvular velocity was within the normal range. There was no stenosis. There was no regurgitation.  ------------------------------------------------------------------- Aorta:  Aortic root: The aortic root was normal in size.  ------------------------------------------------------------------- Mitral valve:   Mildly thickened leaflets . Mobility was not restricted.  Doppler:  Transvalvular velocity was within the normal range. There was no evidence for stenosis. There was trivial regurgitation.    Peak gradient (D): 4 mm Hg.  ------------------------------------------------------------------- Left atrium:  The atrium was normal in size.  ------------------------------------------------------------------- Atrial septum:  No defect or patent foramen ovale was identified.   ------------------------------------------------------------------- Right  ventricle:  The cavity size was normal. Wall thickness was normal. Systolic function was normal.  ------------------------------------------------------------------- Pulmonic valve:    Doppler:  Transvalvular velocity was within the normal range. There was no evidence for stenosis. There was trivial regurgitation.  ------------------------------------------------------------------- Tricuspid valve:   Structurally normal valve.    Doppler: Transvalvular velocity was within the normal range. There was mild regurgitation.  ------------------------------------------------------------------- Pulmonary artery:   The main pulmonary artery was normal-sized. Systolic pressure was within the normal range.  ------------------------------------------------------------------- Right atrium:  The atrium was normal in size.  ------------------------------------------------------------------- Pericardium:  The pericardium was normal in appearance. There was no pericardial effusion.  ------------------------------------------------------------------- Systemic veins: Inferior vena cava: The vessel was normal in size. The respirophasic diameter changes were in the normal range (= 50%), consistent with normal central venous pressure. Diameter: 14 mm.  ------------------------------------------------------------------- Measurements   IVC                                        Value        Reference  ID                                         14    mm     ----------    Left ventricle  Value        Reference  LV ID, ED, PLAX chordal            (L)     27    mm     43 - 52  LV ID, ES, PLAX chordal            (L)     19.6  mm     23 - 38  LV fx shortening, PLAX chordal     (L)     27    %      >=29  LV PW thickness, ED                        12.2  mm     ----------  IVS/LV PW ratio, ED                        0.97         <=1.3  Stroke volume, 2D                          60     ml     ----------  Stroke volume/bsa, 2D                      38    ml/m^2 ----------  LV e&', lateral                             5.98  cm/s   ----------  LV E/e&', lateral                           15.74        ----------  LV e&', medial                              3.37  cm/s   ----------  LV E/e&', medial                            27.92        ----------  LV e&', average                             4.68  cm/s   ----------  LV E/e&', average                           20.13        ----------    Ventricular septum                         Value        Reference  IVS thickness, ED                          11.8  mm     ----------    LVOT                                       Value  Reference  LVOT ID, S                                 17    mm     ----------  LVOT area                                  2.27  cm^2   ----------  LVOT peak velocity, S                      122   cm/s   ----------  LVOT mean velocity, S                      87.6  cm/s   ----------  LVOT VTI, S                                26.4  cm     ----------  LVOT peak gradient, S                      6     mm Hg  ----------    Aorta                                      Value        Reference  Aortic root ID, ED                         27    mm     ----------    Left atrium                                Value        Reference  LA ID, A-P, ES                             31    mm     ----------  LA ID/bsa, A-P                             1.96  cm/m^2 <=2.2  LA volume, S                               32.9  ml     ----------  LA volume/bsa, S                           20.8  ml/m^2 ----------  LA volume, ES, 1-p A4C                     31.2  ml     ----------  LA volume/bsa, ES, 1-p A4C                 19.8  ml/m^2 ----------  LA volume, ES, 1-p A2C  34.7  ml     ----------  LA volume/bsa, ES, 1-p A2C                 22    ml/m^2 ----------    Mitral valve                               Value         Reference  Mitral E-wave peak velocity                94.1  cm/s   ----------  Mitral A-wave peak velocity                129   cm/s   ----------  Mitral deceleration time           (L)     148   ms     150 - 230  Mitral peak gradient, D                    4     mm Hg  ----------  Mitral E/A ratio, peak                     0.7          ----------    Pulmonary arteries                         Value        Reference  PA pressure, S, DP                         28    mm Hg  <=30    Tricuspid valve                            Value        Reference  Tricuspid regurg peak velocity             248   cm/s   ----------  Tricuspid peak RV-RA gradient              25    mm Hg  ----------  Tricuspid maximal regurg velocity,         248   cm/s   ----------  PISA    Right atrium                               Value        Reference  RA ID, S-I, ES, A4C                        39.6  mm     34 - 49  RA area, ES, A4C                           8.68  cm^2   8.3 - 19.5  RA volume, ES, A/L                         16.4  ml     ----------  RA volume/bsa, ES, A/L  10.4  ml/m^2 ----------    Systemic veins                             Value        Reference  Estimated CVP                              3     mm Hg  ----------    Right ventricle                            Value        Reference  TAPSE                                      22.7  mm     ----------  RV pressure, S, DP                         28    mm Hg  <=30  RV s&', lateral, S                          12.1  cm/s   ----------  Legend: (L)  and  (H)  mark values outside specified reference range.  ------------------------------------------------------------------- Prepared and Electronically Authenticated by  Jenkins Rouge, M.D. 2019-05-06T11:35:06   Assessment & Plan:    See Problem List for Assessment and Plan of chronic medical problems.

## 2018-05-08 ENCOUNTER — Ambulatory Visit (INDEPENDENT_AMBULATORY_CARE_PROVIDER_SITE_OTHER)
Admission: RE | Admit: 2018-05-08 | Discharge: 2018-05-08 | Disposition: A | Discharge status: Home / Self Care | Payer: Medicare Other | Source: Ambulatory Visit | Attending: Internal Medicine | Admitting: Internal Medicine

## 2018-05-08 ENCOUNTER — Encounter: Payer: Self-pay | Admitting: Internal Medicine

## 2018-05-08 ENCOUNTER — Ambulatory Visit: Payer: Medicare Other | Admitting: Internal Medicine

## 2018-05-08 ENCOUNTER — Other Ambulatory Visit (INDEPENDENT_AMBULATORY_CARE_PROVIDER_SITE_OTHER): Payer: Medicare Other

## 2018-05-08 VITALS — BP 124/60 | HR 85 | Temp 98.5°F | Resp 16 | Wt 132.0 lb

## 2018-05-08 DIAGNOSIS — M810 Age-related osteoporosis without current pathological fracture: Secondary | ICD-10-CM | POA: Diagnosis not present

## 2018-05-08 DIAGNOSIS — K580 Irritable bowel syndrome with diarrhea: Secondary | ICD-10-CM | POA: Diagnosis not present

## 2018-05-08 DIAGNOSIS — I10 Essential (primary) hypertension: Secondary | ICD-10-CM | POA: Diagnosis not present

## 2018-05-08 DIAGNOSIS — E538 Deficiency of other specified B group vitamins: Secondary | ICD-10-CM | POA: Insufficient documentation

## 2018-05-08 DIAGNOSIS — R7303 Prediabetes: Secondary | ICD-10-CM

## 2018-05-08 DIAGNOSIS — R0602 Shortness of breath: Secondary | ICD-10-CM

## 2018-05-08 DIAGNOSIS — E782 Mixed hyperlipidemia: Secondary | ICD-10-CM | POA: Diagnosis not present

## 2018-05-08 LAB — CBC WITH DIFFERENTIAL/PLATELET
BASOS PCT: 0.9 % (ref 0.0–3.0)
Basophils Absolute: 0.1 10*3/uL (ref 0.0–0.1)
Eosinophils Absolute: 0.2 10*3/uL (ref 0.0–0.7)
Eosinophils Relative: 2.7 % (ref 0.0–5.0)
HEMATOCRIT: 40.5 % (ref 36.0–46.0)
HEMOGLOBIN: 13.7 g/dL (ref 12.0–15.0)
LYMPHS PCT: 24.4 % (ref 12.0–46.0)
Lymphs Abs: 1.5 10*3/uL (ref 0.7–4.0)
MCHC: 33.7 g/dL (ref 30.0–36.0)
MCV: 94.3 fl (ref 78.0–100.0)
MONOS PCT: 12.7 % — AB (ref 3.0–12.0)
Monocytes Absolute: 0.8 10*3/uL (ref 0.1–1.0)
NEUTROS ABS: 3.6 10*3/uL (ref 1.4–7.7)
Neutrophils Relative %: 59.3 % (ref 43.0–77.0)
PLATELETS: 259 10*3/uL (ref 150.0–400.0)
RBC: 4.29 Mil/uL (ref 3.87–5.11)
RDW: 13.5 % (ref 11.5–15.5)
WBC: 6 10*3/uL (ref 4.0–10.5)

## 2018-05-08 LAB — COMPREHENSIVE METABOLIC PANEL
ALBUMIN: 4 g/dL (ref 3.5–5.2)
ALK PHOS: 49 U/L (ref 39–117)
ALT: 13 U/L (ref 0–35)
AST: 14 U/L (ref 0–37)
BUN: 18 mg/dL (ref 6–23)
CALCIUM: 9.2 mg/dL (ref 8.4–10.5)
CHLORIDE: 102 meq/L (ref 96–112)
CO2: 30 mEq/L (ref 19–32)
Creatinine, Ser: 0.87 mg/dL (ref 0.40–1.20)
GFR: 67.09 mL/min (ref >60.00)
Glucose, Bld: 107 mg/dL — ABNORMAL HIGH (ref 70–99)
Potassium: 3.7 mEq/L (ref 3.5–5.1)
SODIUM: 140 meq/L (ref 135–145)
TOTAL PROTEIN: 7 g/dL (ref 6.0–8.3)
Total Bilirubin: 0.8 mg/dL (ref 0.2–1.2)

## 2018-05-08 LAB — LIPID PANEL
CHOLESTEROL: 177 mg/dL (ref 0–200)
HDL: 40.2 mg/dL (ref >39.00)
LDL Cholesterol: 102 mg/dL — ABNORMAL HIGH (ref 0–99)
NonHDL: 136.75
TRIGLYCERIDES: 175 mg/dL — AB (ref 0.0–149.0)
Total CHOL/HDL Ratio: 4
VLDL: 35 mg/dL (ref 0.0–40.0)

## 2018-05-08 LAB — VITAMIN D 25 HYDROXY (VIT D DEFICIENCY, FRACTURES): VITD: 10.83 ng/mL — AB (ref 30.00–100.00)

## 2018-05-08 LAB — TSH: TSH: 2.77 u[IU]/mL (ref 0.35–4.50)

## 2018-05-08 LAB — HEMOGLOBIN A1C: Hgb A1c MFr Bld: 6.2 % (ref 4.6–6.5)

## 2018-05-08 LAB — VITAMIN B12: VITAMIN B 12: 458 pg/mL (ref 211–911)

## 2018-05-08 NOTE — Patient Instructions (Signed)
Have blood work and a chest x-ray today.   Test(s) ordered today. Your results will be released to [facility] [place] (or called to you) after review, usually within 72hours after test completion. If any changes need to be made, you will be notified at that same time.    Medications reviewed and updated.  No changes recommended at this time.    Please followup in 6 months

## 2018-05-08 NOTE — Assessment & Plan Note (Signed)
BP well controlled Current regimen effective and well tolerated Continue current medications at current doses cmp  

## 2018-05-08 NOTE — Assessment & Plan Note (Signed)
Not taking b12  Check level

## 2018-05-08 NOTE — Assessment & Plan Note (Addendum)
With urgency, not always diarrhea, but typically has loose stools Refer to GI for re-evaluation

## 2018-05-08 NOTE — Assessment & Plan Note (Signed)
Check lipid panel  Continue daily statin Regular exercise and healthy diet encouraged  

## 2018-05-08 NOTE — Assessment & Plan Note (Signed)
Not taking calcium or vitamin d Will check vitamin d

## 2018-05-08 NOTE — Assessment & Plan Note (Signed)
Has seen cardiology and they did not feel that her heart was the cause We will see pulmonary this month We will get a chest x-ray since that has not been performed in a couple of years Check basic blood work

## 2018-05-08 NOTE — Assessment & Plan Note (Signed)
Check a1c Low sugar / carb diet Stressed regular exercise   

## 2018-05-27 ENCOUNTER — Other Ambulatory Visit: Payer: Self-pay | Admitting: Internal Medicine

## 2018-05-27 ENCOUNTER — Ambulatory Visit: Payer: Medicare Other | Admitting: Internal Medicine

## 2018-05-27 ENCOUNTER — Encounter: Payer: Self-pay | Admitting: Internal Medicine

## 2018-05-27 VITALS — BP 138/89 | HR 95 | Ht <= 58 in | Wt 130.4 lb

## 2018-05-27 DIAGNOSIS — R0602 Shortness of breath: Secondary | ICD-10-CM

## 2018-05-27 DIAGNOSIS — I447 Left bundle-branch block, unspecified: Secondary | ICD-10-CM

## 2018-05-27 DIAGNOSIS — I10 Essential (primary) hypertension: Secondary | ICD-10-CM | POA: Diagnosis not present

## 2018-05-27 NOTE — Progress Notes (Signed)
OFFICE NOTE  Chief Complaint:  Dyspnea on exertion  Primary Care Physician: Binnie Rail, MD  HPI:  Regina Ramsey is a 77 y.o. female who is kindly referred to me for evaluation of palpitations, dyspnea and chest discomfort. She recently was referred for an exercise stress echocardiogram to evaluate symptoms in the setting of left bundle branch block. According to the patient she's had a left bundle branch block for at least 5 years and has had 2 stress tests in the past, both of which were nuclear stress tests. The first study caused her significant nausea vomiting and diarrhea and she was ill for more than 24 hours. The second study apparently caused her last symptoms but did persist for several hours and was a "new stress agent". She was then referred for an exercise echocardiogram however given her left bundle branch block, a stress portion of the study is not interpretable. That test was not performed and she was then referred to me for evaluation. Symptomatically she reports shortness of breath and occasional palpitations but denies any cardiac chest pain. EKG doesn't fact show left bundle branch block which is stable compared to prior EKGs in the past. She has some cardiac risk factors including hypertension and dyslipidemia but is well controlled.  07/30/2016  Regina Ramsey returns today for follow-up. She underwent an echocardiogram which showed preserved LVEF 65-70% with mild diastolic dysfunction. She reports her shortness of breath is improved somewhat. She noted when going out to [place] state where she was on vacation for a week, that her breathing was significantly better, which she attributed to cooler air temperatures and less allergens in the air. I did not see a cardiac cause of her dyspnea despite her left bundle branch block. I'm also reassured by her negative stress testing in the past. My suspicion is that her shortness of breath is related to  COPD.  04/28/2018  Regina Ramsey returns today for follow-up.  I last saw her in 2017.  At that time we did a work-up for new left bundle branch block.  Her nuclear stress test was negative for ischemia and echo showed normal LV function.  She had a pre-existing diagnosis of COPD, but has not been on treatment for that.  Subsequently she says that her COPD "got better", or that she had pulmonary function testing which apparently was normal.  She does have a remote smoking history.  Recently she has had more progressive dyspnea on exertion over the past several weeks.  She also reports some orthopnea, mild weight gain but no significant edema.  She denies any chest pain.  She has been bothered by worsening seasonal allergies, but doubles up her dose of fexofenadine with improvement in her symptoms.  She denies mucus or productive cough.  Or wheezing.  05/27/2018  Regina Ramsey returns for follow-up of her echo.  Fortunately this shows normal systolic function although she does have incoordinate septal motion due to her LBBB.  She has persistent shortness of breath, even with minimal exertion.  She ambulated today which demonstrated oxygen saturation that decreased to 92% but not lower.  She said this is consistent with findings at home.  I had recommended next for her to undergo pulmonary function testing.  If this is abnormal then I would recommend referral to pulmonary.  If PFTs are normal, then we should may be consider additional coronary assessment, perhaps even a Myoview stress test.  She did have a stress echo in 2017 however the  results were somewhat inconclusive.  PMHx:  Past Medical History:  Diagnosis Date  . ALLERGIC RHINITIS   . ANEMIA-NOS   . Anxiety   . Asthma    Triggered by allergies  . COPD    normal PFTs 06/22/11  . Diverticulosis of colon   . Dyspareunia   . Endometriosis   . GERD   . HEARING LOSS 07/13/2010  . HYPERLIPIDEMIA   . HYPERTENSION   . Irritable bowel syndrome   .  MIGRAINE HEADACHE   . OSTEOPOROSIS    on Prolia q 18mo hx vertebral fx  . PALPITATIONS, HX OF   . Vaginal atrophy     Past Surgical History:  Procedure Laterality Date  . ABDOMINAL HYSTERECTOMY  1974  . APPENDECTOMY  1958  . BREAST SURGERY  1985   breast biopsy    FAMHx:  Family History  Problem Relation Age of Onset  . Arthritis Mother   . Breast cancer Mother   . Cancer Mother        started on her spine met cancer  . Bone cancer Mother   . Arthritis Father   . Breast cancer Maternal Aunt   . Diabetes Other        parent & grandparent  . Hyperlipidemia Maternal Grandmother   . Bradycardia Son   . Heart Problems Daughter        enlarged heart  . Cancer Brother   . Colon cancer Neg Hx   . Colon polyps Neg Hx     SOCHx:   reports that she quit smoking about 54 years ago. She has never used smokeless tobacco. She reports that she drinks alcohol. She reports that she does not use drugs.  ALLERGIES:  No Known Allergies  ROS: Pertinent items noted in HPI and remainder of comprehensive ROS otherwise negative.  HOME MEDS: Current Outpatient Medications  Medication Sig Dispense Refill  . albuterol (PROAIR HFA) 108 (90 Base) MCG/ACT inhaler INHALE 2 PUFFS BY MOUTH EVERY 6 HOURS AS NEEDED FOR WHEEZING 8.5 g 8  . Alum & Mag Hydroxide-Simeth (MYLANTA PO) 1 Dose as needed.    .Marland Kitchenatorvastatin (LIPITOR) 20 MG tablet Take 1 tablet (20 mg total) by mouth daily. -- Office visit needed for further refills 90 tablet 0  . fexofenadine (ALLEGRA ALLERGY) 60 MG tablet Take 1 tablet (60 mg total) by mouth 2 (two) times daily. 30 tablet 12  . glucosamine-chondroitin (MAX GLUCOSAMINE CHONDROITIN) 500-400 MG tablet Take 1 tablet by mouth 3 (three) times daily.    . Loperamide HCl (IMODIUM PO) Take 1 tablet by mouth as needed.    .Marland Kitchentelmisartan-hydrochlorothiazide (MICARDIS HCT) 40-12.5 MG tablet Take 1 tablet by mouth daily. 30 tablet 5   No current facility-administered medications for  this visit.     LABS/IMAGING: No results found for this or any previous visit (from the past 48 hour(s)). No results found.  WEIGHTS: Wt Readings from Last 3 Encounters:  05/27/18 130 lb 6.4 oz (59.1 kg)  05/08/18 132 lb (59.9 kg)  04/28/18 131 lb (59.4 kg)    VITALS: BP 138/89   Pulse 95   Ht _0  (1.473 m)   Wt 130 lb 6.4 oz (59.1 kg)   BMI 27.25 kg/m   EXAM: Deferred  EKG: Deferred  ASSESSMENT: 1. Progressive Dyspnea - LVEF 55-60% (2019), elevated LV filling pressure 2. LBBB 3. Palpitations 4. Hypertension-controlled 5. Dyslipidemia  PLAN: 1.   Regina Ramsey is had progressive dyspnea and abnormal LV function by  echo both in 2017 and now in 2019.  We will go ahead and perform full pulmonary function tests with diffusion and if there are any abnormalities refer her to pulmonary.  Otherwise, she may need further coronary assessment.  An option would be either a [place] or coronary artery CT angiography.  I will discuss this further with her at follow-up.  Pixie Casino, MD, [facility], [place] Director of the [facility]  [facility] Diplomate of the American Board of Clinical Lipidology Attending Cardiologist  Direct Dial: [phone]  Fax: [phone]  Website:  [email or web address] 05/27/2018, 9:37 AM

## 2018-05-27 NOTE — Patient Instructions (Addendum)
Your physician has recommended that you have a pulmonary function test. Pulmonary Function Tests are a group of tests that measure how well air moves in and out of your lungs. >> this is done at [place] physician recommends that you schedule a follow-up appointment in about 1 month

## 2018-05-28 ENCOUNTER — Ambulatory Visit (INDEPENDENT_AMBULATORY_CARE_PROVIDER_SITE_OTHER): Payer: Medicare Other | Admitting: Internal Medicine

## 2018-05-28 DIAGNOSIS — R0602 Shortness of breath: Secondary | ICD-10-CM

## 2018-05-28 LAB — PULMONARY FUNCTION TEST
DL/VA % pred: 74 %
DL/VA: 4 ml/min/mmHg/L
DLCO UNC: 14.21 ml/min/mmHg
DLCO unc % pred: 45 %
FEF 25-75 Post: 2.38 L/sec
FEF 25-75 Pre: 2.1 L/sec
FEF2575-%CHANGE-POST: 13 %
FEF2575-%Pred-Post: 128 %
FEF2575-%Pred-Pre: 113 %
FEV1-%CHANGE-POST: 1 %
FEV1-%PRED-PRE: 75 %
FEV1-%Pred-Post: 77 %
FEV1-POST: 1.95 L
FEV1-PRE: 1.91 L
FEV1FVC-%CHANGE-POST: 5 %
FEV1FVC-%Pred-Pre: 111 %
FEV6-%Change-Post: -3 %
FEV6-%Pred-Post: 70 %
FEV6-%Pred-Pre: 72 %
FEV6-POST: 2.25 L
FEV6-Pre: 2.32 L
FEV6FVC-%PRED-POST: 105 %
FEV6FVC-%Pred-Pre: 105 %
FVC-%CHANGE-POST: -3 %
FVC-%PRED-POST: 66 %
FVC-%PRED-PRE: 69 %
FVC-POST: 2.25 L
FVC-PRE: 2.32 L
PRE FEV6/FVC RATIO: 100 %
Post FEV1/FVC ratio: 87 %
Post FEV6/FVC ratio: 100 %
Pre FEV1/FVC ratio: 82 %
RV % PRED: 62 %
RV: 1.6 L
TLC % pred: 67 %
TLC: 3.89 L

## 2018-05-28 NOTE — Progress Notes (Signed)
PFT completed 05/28/18 per Dr. Debara Pickett request.

## 2018-05-29 ENCOUNTER — Telehealth: Payer: Self-pay | Admitting: Internal Medicine

## 2018-05-29 NOTE — Telephone Encounter (Signed)
Left message for patient regarding her Prolia injection.  Insurance was verified and summary of benefits states that patient would be responsible for a $70 copay. Patient is due on or after June 4th. Left message for patient to call back to schedule.

## 2018-06-03 ENCOUNTER — Other Ambulatory Visit: Payer: Self-pay | Admitting: *Deleted

## 2018-06-03 DIAGNOSIS — R942 Abnormal results of pulmonary function studies: Secondary | ICD-10-CM

## 2018-06-03 DIAGNOSIS — R0602 Shortness of breath: Secondary | ICD-10-CM

## 2018-06-03 NOTE — Progress Notes (Signed)
amb  

## 2018-07-04 NOTE — Telephone Encounter (Signed)
Left message for patient regarding note below.

## 2018-07-07 ENCOUNTER — Ambulatory Visit: Payer: Medicare Other | Admitting: Gastroenterology

## 2018-07-07 ENCOUNTER — Encounter: Payer: Self-pay | Admitting: Gastroenterology

## 2018-07-07 VITALS — BP 132/68 | HR 62 | Ht <= 58 in | Wt 131.0 lb

## 2018-07-07 DIAGNOSIS — K580 Irritable bowel syndrome with diarrhea: Secondary | ICD-10-CM | POA: Diagnosis not present

## 2018-07-07 MED ORDER — DICYCLOMINE HCL 10 MG PO CAPS: 10.0000 mg | ORAL_CAPSULE | Freq: Two times a day (BID) | ORAL | 3 refills | Status: DC | PRN

## 2018-07-07 NOTE — Progress Notes (Signed)
[facility] Consult Note:  History: Regina Ramsey 07/07/2018  Referring physician: Binnie Rail, MD  Reason for consult/chief complaint: Diarrhea (chronic but got worse in 2015;severly limits her daily activity )   Subjective  HPI:  This is a very pleasant 77 year old woman referred by primary care.  She was last seen by Dr. Deatra Ina in March 2016 after a colonoscopy that was done for intermittent diarrhea with fecal incontinence.  Biopsies negative for microscopic colitis.  Her symptoms were reportedly improved on a regimen of fiber and rifaximin.  She has not been seen in the clinic since then. [name] was sent by Dr. [name] for reevaluation of this problem.  She describes it being an issue her entire life, but worse in the last several years.  She does not have chronic abdominal pain, and some days has regular bowel movements.  However, more days than not, she will have loose stool with urgency and occasional accidents if she cannot get to the bathroom on time.  She denies nocturnal diarrhea, and has had no episodes of volume depletion.  This is more of a problem if she is out of the house for the day or travel, as she has come to realize that even anticipating the need for a bathroom might make the symptoms occur.  Her appetite is generally good, and she has gained few pounds in the last year.  She denies dysphagia, odynophagia, nausea, vomiting or early satiety.  ROS:  Review of Systems  Constitutional: Negative for appetite change and unexpected weight change.  HENT: Negative for mouth sores and voice change.   Eyes: Negative for pain and redness.  Respiratory: Negative for cough and shortness of breath.   Cardiovascular: Negative for chest pain and palpitations.  Genitourinary: Negative for dysuria and hematuria.  Musculoskeletal: Positive for arthralgias. Negative for myalgias.  Skin: Negative for pallor and rash.  Neurological: Negative for weakness and  headaches.  Hematological: Negative for adenopathy.     Past Medical History: Past Medical History:  Diagnosis Date  . ALLERGIC RHINITIS   . ANEMIA-NOS   . Anxiety   . Asthma    Triggered by allergies  . COPD    normal PFTs 06/22/11  . Diverticulosis of colon   . Dyspareunia   . Endometriosis   . GERD   . HEARING LOSS 07/13/2010  . HYPERLIPIDEMIA   . HYPERTENSION   . Irritable bowel syndrome   . MIGRAINE HEADACHE   . OSTEOPOROSIS    on Prolia q 50mo hx vertebral fx  . PALPITATIONS, HX OF   . Vaginal atrophy      Past Surgical History: Past Surgical History:  Procedure Laterality Date  . ABDOMINAL HYSTERECTOMY  1974  . APPENDECTOMY  1958  . BREAST SURGERY  1985   breast biopsy     Family History: Family History  Adopted: Yes  Problem Relation Age of Onset  . Arthritis Mother   . Breast cancer Mother   . Cancer Mother        started on her spine met cancer  . Bone cancer Mother   . Arthritis Father   . Breast cancer Maternal Aunt   . Diabetes Other        parent & grandparent  . Hyperlipidemia Maternal Grandmother   . Bradycardia Son   . Heart Problems Daughter        enlarged heart  . Cancer - Prostate Brother   . Colon cancer Neg Hx   .  Colon polyps Neg Hx     Social History: Social History   Socioeconomic History  . Marital status: Married    Spouse name: Not on file  . Number of children: 2  . Years of education: Not on file  . Highest education level: Not on file  Occupational History  . Occupation: Retired  Scientific laboratory technician  . Financial resource strain: Not on file  . Food insecurity:    Worry: Not on file    Inability: Not on file  . Transportation needs:    Medical: Not on file    Non-medical: Not on file  Tobacco Use  . Smoking status: Former Smoker    Last attempt to quit: 01/01/1964    Years since quitting: 54.5  . Smokeless tobacco: Never Used  . Tobacco comment: Married, lives with souse. Master of education-specialist needs  for grade school  Substance and Sexual Activity  . Alcohol use: Yes    Alcohol/week: 0.0 oz    Comment: Social  . Drug use: No  . Sexual activity: Not on file  Lifestyle  . Physical activity:    Days per week: Not on file    Minutes per session: Not on file  . Stress: Not on file  Relationships  . Social connections:    Talks on phone: Not on file    Gets together: Not on file    Attends religious service: Not on file    Active member of club or organization: Not on file    Attends meetings of clubs or organizations: Not on file    Relationship status: Not on file  Other Topics Concern  . Not on file  Social History Narrative   Retired Pharmacist, hospital      No regular exercise, active      epworth sleepiness scale = 10 (05/30/16)    Allergies: No Known Allergies  Outpatient Meds: Current Outpatient Medications  Medication Sig Dispense Refill  . Alum & Mag Hydroxide-Simeth (MYLANTA PO) 1 Dose 3 (three) times daily. Pt takes before and after each meal    . atorvastatin (LIPITOR) 20 MG tablet Take 1 tablet (20 mg total) by mouth daily. 90 tablet 1  . fexofenadine (ALLEGRA ALLERGY) 60 MG tablet Take 1 tablet (60 mg total) by mouth 2 (two) times daily. (Patient taking differently: Take 60 mg by mouth 2 (two) times daily as needed. ) 30 tablet 12  . Loperamide HCl (IMODIUM PO) Take 1 tablet by mouth as needed (pt takes 2 times per week).     Marland Kitchen telmisartan-hydrochlorothiazide (MICARDIS HCT) 40-12.5 MG tablet Take 1 tablet by mouth daily. 30 tablet 5  . dicyclomine (BENTYL) 10 MG capsule Take 1 capsule (10 mg total) by mouth 2 (two) times daily as needed for spasms. 45 capsule 3   No current facility-administered medications for this visit.       ___________________________________________________________________ Objective   Exam:  BP 132/68   Pulse 62   Ht 4' 10"  (1.473 m)   Wt 131 lb (59.4 kg)   BMI 27.38 kg/m    General: this is a(n) well-appearing woman, gets on exam  table without assistance  Eyes: sclera anicteric, no redness  ENT: oral mucosa moist without lesions, no cervical or supraclavicular lymphadenopathy, good dentition  CV: RRR without murmur, S1/S2, no JVD, no peripheral edema  Resp: clear to auscultation bilaterally, normal RR and effort noted  GI: soft, no tenderness, with active bowel sounds. No guarding or palpable organomegaly noted.  Skin; warm  and dry, no rash or jaundice noted  Neuro: awake, alert and oriented x 3. Normal gross motor function and fluent speech  Labs:  CBC Latest Ref Rng & Units 05/08/2018 10/11/2015 01/06/2015  WBC 4.0 - 10.5 K/uL 6.0 6.0 5.6  Hemoglobin 12.0 - 15.0 g/dL 13.7 13.6 13.7  Hematocrit 36.0 - 46.0 % 40.5 40.2 40.9  Platelets 150.0 - 400.0 K/uL 259.0 226.0 238.0   CMP Latest Ref Rng & Units 05/08/2018 10/11/2016 04/12/2016  Glucose 70 - 99 mg/dL 107(H) 103(H) 90  BUN 6 - 23 mg/dL 18 18 20   Creatinine 0.40 - 1.20 mg/dL 0.87 0.86 0.74  Sodium 135 - 145 mEq/L 140 139 142  Potassium 3.5 - 5.1 mEq/L 3.7 3.5 3.3(L)  Chloride 96 - 112 mEq/L 102 100 102  CO2 19 - 32 mEq/L 30 33(H) 35(H)  Calcium 8.4 - 10.5 mg/dL 9.2 9.4 9.5  Total Protein 6.0 - 8.3 g/dL 7.0 7.2 7.3  Total Bilirubin 0.2 - 1.2 mg/dL 0.8 0.7 0.6  Alkaline Phos 39 - 117 U/L 49 53 63  AST 0 - 37 U/L 14 24 24   ALT 0 - 35 U/L 13 24 22      Assessment: Encounter Diagnosis  Name Primary?  . Irritable bowel syndrome with diarrhea Yes    She has classic IBS symptoms occurring her entire life, worse in the last several years.  She has anticipatory symptoms when out of the house.  We discussed the benign nature of this condition and the available therapies.  I think low-dose dicyclomine or hyoscyamine would work well for her.  If that works well, she can also take an evening dose if needed.  She is cautioned against daily dosing of Imodium because it might precipitate constipation.  For some time now, she has been using a dose of that about twice a  week which will control the symptoms until they recur again a few days later. I do not think she has a small bowel malabsorptive condition, and she reports no improvement on previous trial of rifaximin, either with Dr. Deatra Ina gave her to her nor another trial by primary care last year  Plan:  Dicyclomine prescribed, she will call as needed for advice and see me as needed.  Thank you for the courtesy of this consult.  Please call me with any questions or concerns.  Nelida Meuse III  CC: Binnie Rail, MD

## 2018-07-07 NOTE — Patient Instructions (Signed)
If you are age 78 or older, your body mass index should be between 23-30. Your Body mass index is 27.38 kg/m. If this is out of the aforementioned range listed, please consider follow up with your Primary Care Provider.  If you are age 28 or younger, your body mass index should be between 19-25. Your Body mass index is 27.38 kg/m. If this is out of the aformentioned range listed, please consider follow up with your Primary Care Provider.   Follow up as needed. [phone]  It was a pleasure to see you today!  Dr. Loletha Carrow

## 2018-07-15 ENCOUNTER — Ambulatory Visit: Payer: Medicare Other | Admitting: Pulmonary Disease

## 2018-07-15 ENCOUNTER — Encounter: Payer: Self-pay | Admitting: Pulmonary Disease

## 2018-07-15 VITALS — BP 118/60 | HR 63 | Ht <= 58 in | Wt 131.0 lb

## 2018-07-15 DIAGNOSIS — R0602 Shortness of breath: Secondary | ICD-10-CM

## 2018-07-15 NOTE — Progress Notes (Signed)
Synopsis: Referred in July 2019 for dyspnea  Subjective:   PATIENT ID: Regina Ramsey GENDER: female DOB: 1941-10-26, MRN: [id number]   HPI  Chief Complaint  Patient presents with  . New Consult    Shortness of breath     [name] is here to see me today for dyspnea, she was referred by Dr. Debara Pickett after lung function testing was abnormal. > started in 2015 while she was traveling in [place] > she noted she couldn't keep up with friends > however it went away after a few months > about three months ago (2019) she started ot feel worse again with dyspnea > she now notices exertional dyspnea > she says that sometimes just walking from one room to another she will feel short of breath > she is now not walking the dog anymore > when she is short of breath she has an upper airway wheeze > no pain or tightness when this happens > no new stress in her life > on associated leg swelling or pain  She was never told that she had a lung problem as a child.  She says she used to have bronchitis a lot as a young adult.  She saw an allergist and was given an inhaler which helped.  She wore a mask a lot.  Eventually this went away.  No recent bronchitis.  She smoked very briefly as an adult, about 4 years total.  She worked as a Education officer, museum for 35 years.  She says that she has no family history of lung problems (mom, dad, brothers, sisters).  Her brother has cancer, not lung cancer.    Reflux > has been more of a problem lately > associated with a cough and mucus production > she is seeing Dr. Loletha Carrow for this > she is taking some muscle relaxants for her gut   Past Medical History:  Diagnosis Date  . ALLERGIC RHINITIS   . ANEMIA-NOS   . Anxiety   . Asthma    Triggered by allergies  . COPD    normal PFTs 06/22/11  . Diverticulosis of colon   . Dyspareunia   . Endometriosis   . GERD   . HEARING LOSS 07/13/2010  . HYPERLIPIDEMIA   . HYPERTENSION   . Irritable bowel  syndrome   . MIGRAINE HEADACHE   . OSTEOPOROSIS    on Prolia q 26mo hx vertebral fx  . PALPITATIONS, HX OF   . Vaginal atrophy      Family History  Adopted: Yes  Problem Relation Age of Onset  . Arthritis Mother   . Breast cancer Mother   . Cancer Mother        started on her spine met cancer  . Bone cancer Mother   . Arthritis Father   . Breast cancer Maternal Aunt   . Diabetes Other        parent & grandparent  . Hyperlipidemia Maternal Grandmother   . Bradycardia Son   . Heart Problems Daughter        enlarged heart  . Cancer - Prostate Brother   . Colon cancer Neg Hx   . Colon polyps Neg Hx      Social History   Socioeconomic History  . Marital status: Married    Spouse name: Not on file  . Number of children: 2  . Years of education: Not on file  . Highest education level: Not on file  Occupational History  . 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 Financial resource strain: Not on file  . Food insecurity:    Worry: Not on file    Inability: Not on file  . Transportation needs:    Medical: Not on file    Non-medical: Not on file  Tobacco Use  . Smoking status: Former Smoker    Last attempt to quit: 01/01/1964    Years since quitting: 54.5  . Smokeless tobacco: Never Used  . Tobacco comment: Married, lives with souse. Master of education-specialist needs for grade school  Substance and Sexual Activity  . Alcohol use: Yes    Alcohol/week: 0.0 oz    Comment: Social  . Drug use: No  . Sexual activity: Not on file  Lifestyle  . Physical activity:    Days per week: Not on file    Minutes per session: Not on file  . Stress: Not on file  Relationships  . Social connections:    Talks on phone: Not on file    Gets together: Not on file    Attends religious service: Not on file    Active member of club or organization: Not on file    Attends meetings of clubs or organizations: Not on file    Relationship status: Not on file  . Intimate partner violence:     Fear of current or ex partner: Not on file    Emotionally abused: Not on file    Physically abused: Not on file    Forced sexual activity: Not on file  Other Topics Concern  . Not on file  Social History Narrative   Retired Pharmacist, hospital      No regular exercise, active      epworth sleepiness scale = 10 (05/30/16)     No Known Allergies   Outpatient Medications Prior to Visit  Medication Sig Dispense Refill  . atorvastatin (LIPITOR) 20 MG tablet Take 1 tablet (20 mg total) by mouth daily. 90 tablet 1  . dicyclomine (BENTYL) 10 MG capsule Take 1 capsule (10 mg total) by mouth 2 (two) times daily as needed for spasms. 45 capsule 3  . fexofenadine (ALLEGRA ALLERGY) 60 MG tablet Take 1 tablet (60 mg total) by mouth 2 (two) times daily. (Patient taking differently: Take 60 mg by mouth 2 (two) times daily as needed. ) 30 tablet 12  . omeprazole (PRILOSEC) 20 MG capsule Take 20 mg by mouth daily.    Marland Kitchen telmisartan-hydrochlorothiazide (MICARDIS HCT) 40-12.5 MG tablet Take 1 tablet by mouth daily. 30 tablet 5  . Loperamide HCl (IMODIUM PO) Take 1 tablet by mouth as needed (pt takes 2 times per week).     . Alum & Mag Hydroxide-Simeth (MYLANTA PO) 1 Dose 3 (three) times daily. Pt takes before and after each meal     No facility-administered medications prior to visit.     Review of Systems  Constitutional: Negative for fever and weight loss.  HENT: Negative for congestion, ear pain, nosebleeds and sore throat.   Eyes: Negative for redness.  Respiratory: Positive for shortness of breath. Negative for cough and wheezing.   Cardiovascular: Negative for palpitations, leg swelling and PND.  Gastrointestinal: Negative for nausea and vomiting.  Genitourinary: Negative for dysuria.  Skin: Negative for rash.  Neurological: Negative for headaches.  Endo/Heme/Allergies: Does not bruise/bleed easily.  Psychiatric/Behavioral: Negative for depression. The patient is nervous/anxious.       Objective:    Physical Exam   Vitals:   07/15/18 1127  BP: 118/60  Pulse: 63  SpO2: 99%  Weight: 131 lb (59.4 kg)  Height: 4' 10"  (1.473 m)    RA  Gen: well appearing, no acute distress HENT: NCAT, OP clear, neck supple without masses Eyes: PERRL, EOMi Lymph: no cervical lymphadenopathy PULM: Few fine crackles bases B CV: RRR, no mgr, no JVD GI: BS+, soft, nontender, no hsm Derm: no rash or skin breakdown MSK: normal bulk and tone Neuro: A&Ox4, CN II-XII intact, strength 5/5 in all 4 extremities Psyche: normal mood and affect   CBC    Component Value Date/Time   WBC 6.0 05/08/2018 0831   RBC 4.29 05/08/2018 0831   HGB 13.7 05/08/2018 0831   HCT 40.5 05/08/2018 0831   PLT 259.0 05/08/2018 0831   MCV 94.3 05/08/2018 0831   MCHC 33.7 05/08/2018 0831   RDW 13.5 05/08/2018 0831   LYMPHSABS 1.5 05/08/2018 0831   MONOABS 0.8 05/08/2018 0831   EOSABS 0.2 05/08/2018 0831   BASOSABS 0.1 05/08/2018 0831     Chest imaging:  PFT: May 2019 pulmonary function test performed: Body height was entered incorrectly, we are going to have this re-read  Labs:  Path:  Echo: 2019 echocardiogram LVEF 55 to 60% with elevated LV pressure.  Heart Catheterization:  Records from her visit with cardiology reviewed where she was noted to have increased left ventricular end-diastolic pressure but everything else was within normal limits.     Assessment & Plan:   Shortness of breath  Discussion: This is a pleasant 77 year old female who comes to my clinic today for shortness of breath.  It is unclear to me if she actually does have an objective abnormality from her lung function test to because the original body height parameters were entered incorrectly.  Were going to rerun the result first.  That being said, she does have a decreased DLCO and some crackles on exam so I do think there is value in at least getting a high-resolution CT scan of the chest to ensure there is nothing else to explain  her abnormal physical exam.  The shape of the flow volume loop from the lung function test showed variable upper airflow obstruction and today when I asked her how she feels when she is short of breath she quite eloquently demonstrated classic vocal cord dysfunction.  Hopeful that all that is going on.  Plan: Shortness of breath: We are going to reevaluate her lung function test and call you with the results We will get a high-resolution CT scan of the chest because of the crackles I heard on physical exam today Hopefully this is nothing more than something called vocal cord dysfunction which we will need to evaluate with the voice center over at [place] ambulatory oxygen saturation today in office  Follow-up 4 weeks or sooner if needed    Current Outpatient Medications:  .  atorvastatin (LIPITOR) 20 MG tablet, Take 1 tablet (20 mg total) by mouth daily., Disp: 90 tablet, Rfl: 1 .  dicyclomine (BENTYL) 10 MG capsule, Take 1 capsule (10 mg total) by mouth 2 (two) times daily as needed for spasms., Disp: 45 capsule, Rfl: 3 .  fexofenadine (ALLEGRA ALLERGY) 60 MG tablet, Take 1 tablet (60 mg total) by mouth 2 (two) times daily. (Patient taking differently: Take 60 mg by mouth 2 (two) times daily as needed. ), Disp: 30 tablet, Rfl: 12 .  omeprazole (PRILOSEC) 20 MG capsule, Take 20 mg by mouth daily., Disp: , Rfl:  .  telmisartan-hydrochlorothiazide (MICARDIS HCT) 40-12.5 MG  tablet, Take 1 tablet by mouth daily., Disp: 30 tablet, Rfl: 5 .  Loperamide HCl (IMODIUM PO), Take 1 tablet by mouth as needed (pt takes 2 times per week). , Disp: , Rfl:

## 2018-07-15 NOTE — Patient Instructions (Signed)
Shortness of breath: We are going to reevaluate her lung function test and call you with the results We will get a high-resolution CT scan of the chest because of the crackles I heard on physical exam today Hopefully this is nothing more than something called vocal cord dysfunction which we will need to evaluate with the voice center over at [facility]  Follow-up 4 weeks or sooner if needed

## 2018-08-08 ENCOUNTER — Ambulatory Visit: Payer: Medicare Other | Admitting: Internal Medicine

## 2018-08-11 ENCOUNTER — Ambulatory Visit (INDEPENDENT_AMBULATORY_CARE_PROVIDER_SITE_OTHER)
Admission: RE | Admit: 2018-08-11 | Discharge: 2018-08-11 | Disposition: A | Discharge status: Home / Self Care | Payer: Medicare Other | Source: Ambulatory Visit | Attending: Pulmonary Disease | Admitting: Pulmonary Disease

## 2018-08-11 DIAGNOSIS — R0602 Shortness of breath: Secondary | ICD-10-CM

## 2018-08-18 ENCOUNTER — Other Ambulatory Visit: Payer: Self-pay | Admitting: Pulmonary Disease

## 2018-08-18 DIAGNOSIS — R0602 Shortness of breath: Secondary | ICD-10-CM

## 2018-08-20 ENCOUNTER — Ambulatory Visit: Payer: Medicare Other | Admitting: Pulmonary Disease

## 2018-08-20 ENCOUNTER — Other Ambulatory Visit: Payer: Medicare Other

## 2018-08-20 ENCOUNTER — Encounter: Payer: Self-pay | Admitting: Pulmonary Disease

## 2018-08-20 ENCOUNTER — Ambulatory Visit: Payer: Medicare Other

## 2018-08-20 VITALS — BP 124/68 | HR 96 | Ht <= 58 in | Wt 132.6 lb

## 2018-08-20 DIAGNOSIS — J479 Bronchiectasis, uncomplicated: Secondary | ICD-10-CM | POA: Diagnosis not present

## 2018-08-20 DIAGNOSIS — K219 Gastro-esophageal reflux disease without esophagitis: Secondary | ICD-10-CM

## 2018-08-20 DIAGNOSIS — R0602 Shortness of breath: Secondary | ICD-10-CM | POA: Diagnosis not present

## 2018-08-20 LAB — PULMONARY FUNCTION TEST
DL/VA % pred: 104 %
DL/VA: 4.09 ml/min/mmHg/L
DLCO COR % PRED: 86 %
DLCO cor: 14.03 ml/min/mmHg
DLCO unc % pred: 87 %
DLCO unc: 14.16 ml/min/mmHg
FEF 25-75 POST: 2.16 L/s
FEF 25-75 Pre: 2.17 L/sec
FEF2575-%Change-Post: 0 %
FEF2575-%Pred-Post: 178 %
FEF2575-%Pred-Pre: 179 %
FEV1-%Change-Post: 0 %
FEV1-%PRED-POST: 129 %
FEV1-%Pred-Pre: 129 %
FEV1-POST: 1.93 L
FEV1-Pre: 1.93 L
FEV1FVC-%Change-Post: 2 %
FEV1FVC-%Pred-Pre: 114 %
FEV6-%Change-Post: -2 %
FEV6-%Pred-Post: 116 %
FEV6-%Pred-Pre: 119 %
FEV6-PRE: 2.27 L
FEV6-Post: 2.21 L
FEV6FVC-%Pred-Post: 106 %
FEV6FVC-%Pred-Pre: 106 %
FVC-%CHANGE-POST: -2 %
FVC-%PRED-POST: 109 %
FVC-%PRED-PRE: 112 %
FVC-POST: 2.21 L
FVC-PRE: 2.27 L
PRE FEV1/FVC RATIO: 85 %
Post FEV1/FVC ratio: 87 %
Post FEV6/FVC ratio: 100 %
Pre FEV6/FVC Ratio: 100 %
RV % pred: 87 %
RV: 1.78 L
TLC % pred: 97 %
TLC: 4.04 L

## 2018-08-20 NOTE — Patient Instructions (Signed)
Shortness of breath: As we described today I believe that this is due to acid reflux causing irritation on your vocal cords.  Gastroesophageal reflux disease: Take omeprazole 20 mg daily in the morning Take ranitidine at night Follow the acid reflux guidelines we gave you  Bronchiectasis: This term means that your airways are bigger and more dilated than they are supposed to be.  This finding was discovered on the CT scan of your chest.  Because your lung function testing is normal I do not think this is going to progress into something more concerning however it does make you at risk for respiratory infection.  Please reconsider getting a flu shot every year and practice good hand hygiene.  We will see you back in 4 weeks.  If you are still having trouble with the shortness of breath then we will refer you to the voice center at [facility] for evaluation of a condition called vocal cord dysfunction.

## 2018-08-20 NOTE — Progress Notes (Signed)
Synopsis: Referred in July 2019 for dyspnea  Subjective:   PATIENT ID: Regina Ramsey GENDER: female DOB: 1941-06-16, MRN: [id number]   HPI  Chief Complaint  Patient presents with  . Follow-up    Shortness of breath; Pt completed PFT today.    [name] is here to follow-up the results of her lung function test as well as her CT scan.  She says she still having some shortness of breath when she exerts herself.  She notes the wheezing from time to time that she mentioned last time.  She has not had bronchitis or pneumonia since the last visit.  She notes significant acid reflux symptoms every day with "anything I eat".  She says that she "eats omeprazole like candy ".  Past Medical History:  Diagnosis Date  . ALLERGIC RHINITIS   . ANEMIA-NOS   . Anxiety   . Asthma    Triggered by allergies  . COPD    normal PFTs 06/22/11  . Diverticulosis of colon   . Dyspareunia   . Endometriosis   . GERD   . HEARING LOSS 07/13/2010  . HYPERLIPIDEMIA   . HYPERTENSION   . Irritable bowel syndrome   . MIGRAINE HEADACHE   . OSTEOPOROSIS    on Prolia q 91mo, hx vertebral fx  . PALPITATIONS, HX OF   . Vaginal atrophy       Review of Systems  Constitutional: Negative for fever and weight loss.  HENT: Negative for congestion, ear pain, nosebleeds and sore throat.   Eyes: Negative for redness.  Respiratory: Positive for shortness of breath. Negative for cough and wheezing.   Cardiovascular: Negative for palpitations, leg swelling and PND.  Gastrointestinal: Negative for nausea and vomiting.  Genitourinary: Negative for dysuria.  Skin: Negative for rash.  Neurological: Negative for headaches.  Endo/Heme/Allergies: Does not bruise/bleed easily.  Psychiatric/Behavioral: Negative for depression. The patient is nervous/anxious.       Objective:  Physical Exam   Vitals:   08/20/18 0942 08/20/18 1008  BP:  124/68  Pulse: 92 96  SpO2: 95% 94%  Weight: 132 lb 9.6 oz (60.1 kg) 132 lb  9.6 oz (60.1 kg)  Height: 4' 9.5" (1.461 m) 4\' 10"  (1.473 m)    RA  Gen: well appearing HENT: OP clear, TM's clear, neck supple PULM: CTA B, normal percussion CV: RRR, no mgr, trace edema GI: BS+, soft, nontender Derm: no cyanosis or rash Psyche: normal mood and affect    CBC    Component Value Date/Time   WBC 6.0 05/08/2018 0831   RBC 4.29 05/08/2018 0831   HGB 13.7 05/08/2018 0831   HCT 40.5 05/08/2018 0831   PLT 259.0 05/08/2018 0831   MCV 94.3 05/08/2018 0831   MCHC 33.7 05/08/2018 0831   RDW 13.5 05/08/2018 0831   LYMPHSABS 1.5 05/08/2018 0831   MONOABS 0.8 05/08/2018 0831   EOSABS 0.2 05/08/2018 0831   BASOSABS 0.1 05/08/2018 0831     Chest imaging: 07/2018 CT chest images independently reviewed: diffuse bronchiectasis noted, no pulmonary parenchymal infiltrate  PFT: May 2019 pulmonary function testing showed a box-shaped inspiratory loop, specifically the height was entered incorrectly so predicted values are unreliable August 2019 ratio normal, FVC 2.21 L 109% predicted, total lung capacity 4.04 L 97% predicted, DLCO 14.1 687% predicted  Labs:  Path:  Echo: 2019 echocardiogram LVEF 55 to 60% with elevated LV pressure.  Heart Catheterization:  Records from her visit with cardiology reviewed where she was noted to have increased  left ventricular end-diastolic pressure but everything else was within normal limits.     Assessment & Plan:   Shortness of breath  Bronchiectasis without complication (HCC)  Gastroesophageal reflux disease without esophagitis  Discussion: 77 year old female comes back to follow-up with me today in clinic.  Lung function testing today is completely normal.  Of note, on the last visit though the predicted values were incorrect the shape of the flow volume loop was strongly suggestive of variable upper airway flow obstruction.  Given her ongoing acid reflux symptoms on a day-to-day basis I believe that she likely has vocal cord  dysfunction.  I am hopeful that with treatment of gastroesophageal reflux disease we can see significant improvement in this.  However, if we are unable to help her with shortness of breath with acid reflux treatment then we need to send her to the voice center over at [place]: Shortness of breath: As we described today I believe that this is due to acid reflux causing irritation on your vocal cords.  Gastroesophageal reflux disease: poorly controlled Take omeprazole 20 mg daily in the morning Take ranitidine at night Follow the acid reflux guidelines we gave you  Bronchiectasis: This term means that your airways are bigger and more dilated than they are supposed to be.  This finding was discovered on the CT scan of your chest.  Because your lung function testing is normal I do not think this is going to progress into something more concerning however it does make you at risk for respiratory infection.  Please reconsider getting a flu shot every year and practice good hand hygiene.  We will see you back in 4 weeks.  If you are still having trouble with the shortness of breath then we will refer you to the voice center at [facility] for evaluation of a condition called vocal cord dysfunction.     Current Outpatient Medications:  .  Aspirin-Acetaminophen-Caffeine (GOODYS EXTRA STRENGTH PO), Take by mouth., Disp: , Rfl:  .  atorvastatin (LIPITOR) 20 MG tablet, Take 1 tablet (20 mg total) by mouth daily., Disp: 90 tablet, Rfl: 1 .  fexofenadine (ALLEGRA ALLERGY) 60 MG tablet, Take 1 tablet (60 mg total) by mouth 2 (two) times daily. (Patient taking differently: Take 60 mg by mouth 2 (two) times daily as needed. ), Disp: 30 tablet, Rfl: 12 .  Loperamide HCl (IMODIUM PO), Take 1 tablet by mouth as needed (pt takes 2 times per week). , Disp: , Rfl:  .  omeprazole (PRILOSEC) 20 MG capsule, Take 20 mg by mouth daily., Disp: , Rfl:  .  telmisartan-hydrochlorothiazide (MICARDIS HCT)  40-12.5 MG tablet, Take 1 tablet by mouth daily., Disp: 30 tablet, Rfl: 5

## 2018-08-20 NOTE — Progress Notes (Signed)
PFT completed today.Katie Welchel,CMA  

## 2018-09-17 ENCOUNTER — Ambulatory Visit: Payer: Medicare Other | Admitting: Acute Care

## 2018-09-17 ENCOUNTER — Encounter: Payer: Self-pay | Admitting: *Deleted

## 2018-09-17 ENCOUNTER — Encounter: Payer: Self-pay | Admitting: Acute Care

## 2018-09-17 ENCOUNTER — Ambulatory Visit: Payer: Medicare Other | Admitting: Internal Medicine

## 2018-09-17 ENCOUNTER — Ambulatory Visit (INDEPENDENT_AMBULATORY_CARE_PROVIDER_SITE_OTHER): Payer: Medicare Other

## 2018-09-17 VITALS — BP 120/60 | HR 99 | Ht <= 58 in | Wt 129.6 lb

## 2018-09-17 DIAGNOSIS — R0609 Other forms of dyspnea: Secondary | ICD-10-CM

## 2018-09-17 DIAGNOSIS — K219 Gastro-esophageal reflux disease without esophagitis: Secondary | ICD-10-CM | POA: Diagnosis not present

## 2018-09-17 DIAGNOSIS — Z23 Encounter for immunization: Secondary | ICD-10-CM

## 2018-09-17 DIAGNOSIS — M810 Age-related osteoporosis without current pathological fracture: Secondary | ICD-10-CM

## 2018-09-17 MED ORDER — DENOSUMAB 60 MG/ML ~~LOC~~ SOSY
60.0000 mg | PREFILLED_SYRINGE | Freq: Once | SUBCUTANEOUS | Status: AC
  Administered 2018-09-17: 60 mg via SUBCUTANEOUS

## 2018-09-17 NOTE — Progress Notes (Signed)
77 year old female former smoker ( Quit 1965) with dyspnea, normal PFT, but with a flow loop strongly suggestive of variable upper airway flow obstruction. She is symptomatic for GERD. Bronchiectasis was noted on CT Chest. She is followed by Dr. Lake Bells.  Synopsis: Referred in July 2019 for dyspnea  Subjective:   PATIENT ID: Regina Ramsey GENDER: female DOB: [birth year], MRN: [id number]   HPI  Former smoker ( Quit in 1965) referred for dyspnea in 2019 with normal PFT's and flow volume  loop suggestive of variable upper airway flow obstruction. She was symptomatic for reflux. She was seen by Dr. Lake Bells 08/20/2018 and plan was for aggressive reflux control with omeprazole 20 mg daily in the morning,ranitidine at night, and adherence to a reflux diet ( provided at the last appointment). If symptoms are not improved,  we will refer the patient  to the voice center at [facility] for evaluation of vocal cord dysfunction. Dr. Lake Bells also discussed the finding of bronchiectasis on her CT Chest. He explained that because her  lung function testing was  normal he did not think this is going to progress into something more concerning. He did explain that it does give  her an  Increased risk for respiratory infection.  He asked her to  reconsider getting a flu shot every year and practice good hand hygiene.  09/17/2018 4 week Follow up Patient presents for her 4 week follow up to evaluate for improvement in dyspnea that is a suspected result of reflux and vocal cord dysfunction. She states she has been doing well. She has been compliant with her GERD treatment. She states she does still have some dyspnea with exertion. We talked about exercising and conditioning. We discussed the [place]. She denies any fever, chest pain, orthopnea or hemoptysis.She is concerned about a voluntary recall of zantac. We will change this medication to pepcid. She has no upper respiratory issues pertaining  to her bronchiectasis.She has been advised to call for an acute OV if she has any upper airway issues.Continue receiving the flu vaccine and use good hand hygiene.    Past Medical History:  Diagnosis Date  . ALLERGIC RHINITIS   . ANEMIA-NOS   . Anxiety   . Asthma    Triggered by allergies  . COPD    normal PFTs 06/22/11  . Diverticulosis of colon   . Dyspareunia   . Endometriosis   . GERD   . HEARING LOSS 07/13/2010  . HYPERLIPIDEMIA   . HYPERTENSION   . Irritable bowel syndrome   . MIGRAINE HEADACHE   . OSTEOPOROSIS    on Prolia q 24mo, hx vertebral fx  . PALPITATIONS, HX OF   . Vaginal atrophy       Gen: Denies fever, chills, weight change, fatigue, night sweats HEENT: Denies blurred vision, double vision, hearing loss, tinnitus, sinus congestion, rhinorrhea, sore throat, neck stiffness, dysphagia PULM: +shortness of breath with exertion, rare cough, no sputum production, hemoptysis, wheezing CV: Denies chest pain, edema, orthopnea, paroxysmal nocturnal dyspnea, palpitations GI: Denies abdominal pain, nausea, vomiting, diarrhea, hematochezia, melena, constipation, change in bowel habits GU: Denies dysuria, hematuria, polyuria, oliguria, urethral discharge Endocrine: Denies hot or cold intolerance, polyuria, polyphagia or appetite change Derm: Denies rash, dry skin, scaling or peeling skin change Heme: Denies easy bruising, bleeding, bleeding gums Neuro: Denies headache, numbness, weakness, slurred speech, loss of memory or consciousness    Objective:  Physical Exam   BP 120/60 (BP Location: Right Arm,  Cuff Size: Normal)   Pulse 99   Ht 4\' 10"  (1.473 m)   Wt 129 lb 9.6 oz (58.8 kg)   SpO2 95%   BMI 27.09 kg/m  Physical Exam:  General- No distress,  A&Ox3, pleasant ENT: No sinus tenderness, TM clear, pale nasal mucosa, no oral exudate,no post nasal drip, no LAN Cardiac: S1, S2, regular rate and rhythm, no murmur Chest: No wheeze/ rales/ dullness; no accessory  muscle use, no nasal flaring, no sternal retractions Abd.: Soft Non-tender, ND BS +, Body mass index is 27.09 kg/m. Ext: No clubbing cyanosis, edema Neuro:  normal strength, MAE x 4, A&O x 3 Skin: No rashes, warm and dry Psych: normal mood and behavior    Vitals:   09/17/18 1605  BP: 120/60  Pulse: 99  SpO2: 95%  Weight: 129 lb 9.6 oz (58.8 kg)  Height: 4\' 10"  (1.473 m)    RA  CBC    Component Value Date/Time   WBC 6.0 05/08/2018 0831   RBC 4.29 05/08/2018 0831   HGB 13.7 05/08/2018 0831   HCT 40.5 05/08/2018 0831   PLT 259.0 05/08/2018 0831   MCV 94.3 05/08/2018 0831   MCHC 33.7 05/08/2018 0831   RDW 13.5 05/08/2018 0831   LYMPHSABS 1.5 05/08/2018 0831   MONOABS 0.8 05/08/2018 0831   EOSABS 0.2 05/08/2018 0831   BASOSABS 0.1 05/08/2018 0831     Chest imaging: 07/2018 CT chest images independently reviewed: diffuse bronchiectasis noted, no pulmonary parenchymal infiltrate  PFT: May 2019 pulmonary function testing showed a box-shaped inspiratory loop, specifically the height was entered incorrectly so predicted values are unreliable August 2019 ratio normal, FVC 2.21 L 109% predicted, total lung capacity 4.04 L 97% predicted, DLCO 14.1 687% predicted  Labs:  Path:  Echo: 2019 echocardiogram LVEF 55 to 60% with elevated LV pressure.  Heart Catheterization:  Records from her visit with cardiology reviewed where she was noted to have increased left ventricular end-diastolic pressure but everything else was within normal limits.     Assessment & Plan:   Shortness of breath: Continues to have some dyspnea with exertion  Plan: Follow GERD treatment noted below Consider [place] program for deconditioning   Gastroesophageal reflux disease: better controlled with GERD Diet / Omeprazole and Zantac Wants substitute for Zantac as she heard there is a recall Continue  omeprazole 20 mg daily in the morning Stop Zantac at night Start Pepcid 20 mg at  bedtime Follow the acid reflux guidelines we gave you  Bronchiectasis: No acute issues Understands she is more at risk for upper respiratory infections Plan: Flu vaccine Good hand hygeine    Current Outpatient Medications:  .  Aspirin-Acetaminophen-Caffeine (GOODYS EXTRA STRENGTH PO), Take by mouth., Disp: , Rfl:  .  atorvastatin (LIPITOR) 20 MG tablet, Take 1 tablet (20 mg total) by mouth daily., Disp: 90 tablet, Rfl: 1 .  fexofenadine (ALLEGRA ALLERGY) 60 MG tablet, Take 1 tablet (60 mg total) by mouth 2 (two) times daily. (Patient taking differently: Take 60 mg by mouth 2 (two) times daily as needed. ), Disp: 30 tablet, Rfl: 12 .  Loperamide HCl (IMODIUM PO), Take 1 tablet by mouth as needed (pt takes 2 times per week). , Disp: , Rfl:  .  omeprazole (PRILOSEC) 20 MG capsule, Take 20 mg by mouth daily., Disp: , Rfl:  .  telmisartan-hydrochlorothiazide (MICARDIS HCT) 40-12.5 MG tablet, Take 1 tablet by mouth daily., Disp: 30 tablet, Rfl: 5   Magdalen Spatz, AGACNP-BC [facility] Pager # [phone] 09/17/2018 5:34 PM

## 2018-09-17 NOTE — Progress Notes (Signed)
prolia Injection given.   Stacy J Burns, MD  

## 2018-09-17 NOTE — Patient Instructions (Addendum)
It is nice to meet you today. We will continue the omeprazole 20 mg daily Change zantac to pepcid 20 mg at bedtime( famotidine) OTC Continue following the GERD diet Flu shot at primary care today As you are better we will not refer you to the [facility] Follow up in 6 months with Dr. Lake Bells or Judson Roch NP Please contact office for sooner follow up if symptoms do not improve or worsen or seek emergency care

## 2018-09-23 ENCOUNTER — Other Ambulatory Visit: Payer: Self-pay | Admitting: Internal Medicine

## 2018-10-01 ENCOUNTER — Encounter: Payer: Self-pay | Admitting: Internal Medicine

## 2018-10-01 ENCOUNTER — Ambulatory Visit: Payer: Medicare Other | Admitting: Internal Medicine

## 2018-10-01 ENCOUNTER — Other Ambulatory Visit: Payer: Self-pay | Admitting: Internal Medicine

## 2018-10-01 VITALS — BP 118/60 | HR 96 | Ht <= 58 in | Wt 130.0 lb

## 2018-10-01 DIAGNOSIS — I2510 Atherosclerotic heart disease of native coronary artery without angina pectoris: Secondary | ICD-10-CM | POA: Diagnosis not present

## 2018-10-01 DIAGNOSIS — I447 Left bundle-branch block, unspecified: Secondary | ICD-10-CM

## 2018-10-01 DIAGNOSIS — I2584 Coronary atherosclerosis due to calcified coronary lesion: Secondary | ICD-10-CM

## 2018-10-01 DIAGNOSIS — R0609 Other forms of dyspnea: Secondary | ICD-10-CM

## 2018-10-01 MED ORDER — METOPROLOL TARTRATE 50 MG PO TABS: ORAL_TABLET | ORAL | 0 refills | Status: DC

## 2018-10-01 NOTE — Progress Notes (Signed)
OFFICE NOTE  Chief Complaint:  Dyspnea on exertion  Primary Care Physician: Binnie Rail, MD  HPI:  Regina Ramsey is a 77 y.o. female who is kindly referred to me for evaluation of palpitations, dyspnea and chest discomfort. She recently was referred for an exercise stress echocardiogram to evaluate symptoms in the setting of left bundle branch block. According to the patient she's had a left bundle branch block for at least 5 years and has had 2 stress tests in the past, both of which were nuclear stress tests. The first study caused her significant nausea vomiting and diarrhea and she was ill for more than 24 hours. The second study apparently caused her last symptoms but did persist for several hours and was a "new stress agent". She was then referred for an exercise echocardiogram however given her left bundle branch block, a stress portion of the study is not interpretable. That test was not performed and she was then referred to me for evaluation. Symptomatically she reports shortness of breath and occasional palpitations but denies any cardiac chest pain. EKG doesn't fact show left bundle branch block which is stable compared to prior EKGs in the past. She has some cardiac risk factors including hypertension and dyslipidemia but is well controlled.  07/30/2016  [name] returns today for follow-up. She underwent an echocardiogram which showed preserved LVEF 65-70% with mild diastolic dysfunction. She reports her shortness of breath is improved somewhat. She noted when going out to [place] state where she was on vacation for a week, that her breathing was significantly better, which she attributed to cooler air temperatures and less allergens in the air. I did not see a cardiac cause of her dyspnea despite her left bundle branch block. I'm also reassured by her negative stress testing in the past. My suspicion is that her shortness of breath is related to  COPD.  04/28/2018  [name] returns today for follow-up.  I last saw her in 2017.  At that time we did a work-up for new left bundle branch block.  Her nuclear stress test was negative for ischemia and echo showed normal LV function.  She had a pre-existing diagnosis of COPD, but has not been on treatment for that.  Subsequently she says that her COPD "got better", or that she had pulmonary function testing which apparently was normal.  She does have a remote smoking history.  Recently she has had more progressive dyspnea on exertion over the past several weeks.  She also reports some orthopnea, mild weight gain but no significant edema.  She denies any chest pain.  She has been bothered by worsening seasonal allergies, but doubles up her dose of fexofenadine with improvement in her symptoms.  She denies mucus or productive cough.  Or wheezing.  05/27/2018  [name] returns for follow-up of her echo.  Fortunately this shows normal systolic function although she does have incoordinate septal motion due to her LBBB.  She has persistent shortness of breath, even with minimal exertion.  She ambulated today which demonstrated oxygen saturation that decreased to 92% but not lower.  She said this is consistent with findings at home.  I had recommended next for her to undergo pulmonary function testing.  If this is abnormal then I would recommend referral to pulmonary.  If PFTs are normal, then we should may be consider additional coronary assessment, perhaps even a Myoview stress test.  She did have a stress echo in 2017 however the  results were somewhat inconclusive.  10/01/2018  [name] is seen today in follow-up.  She had abnormal PFTs and was referred to pulmonary.  Unfortunately it seems that some of the data was improperly entered into the test and a repeat pulmonary function testing was normal.  She was noted to have some bronchiectasis on the CT.  It was also thought that she has significant  reflux.  She has been on treatment with marked improvement in her reflux symptoms and no further diarrhea which she struggled with, but still has shortness of breath.  In the past, remotely she had stress testing with a Lexiscan however, she reported significant intolerance to the medication and is not interested in repeating that.  Recently the same CT scan of the chest demonstrated multivessel coronary artery calcification.  This suggests the possibility of underlying obstructive coronary disease.  As she still reports shortness of breath with exertion, I would recommend a CT coronary angiogram to further evaluate this.   PMHx:  Past Medical History:  Diagnosis Date  . ALLERGIC RHINITIS   . ANEMIA-NOS   . Anxiety   . Asthma    Triggered by allergies  . COPD    normal PFTs 06/22/11  . Diverticulosis of colon   . Dyspareunia   . Endometriosis   . GERD   . HEARING LOSS 07/13/2010  . HYPERLIPIDEMIA   . HYPERTENSION   . Irritable bowel syndrome   . MIGRAINE HEADACHE   . OSTEOPOROSIS    on Prolia q 67mo hx vertebral fx  . PALPITATIONS, HX OF   . Vaginal atrophy     Past Surgical History:  Procedure Laterality Date  . ABDOMINAL HYSTERECTOMY  1974  . APPENDECTOMY  1958  . BREAST SURGERY  1985   breast biopsy    FAMHx:  Family History  Adopted: Yes  Problem Relation Age of Onset  . Arthritis Mother   . Breast cancer Mother   . Cancer Mother        started on her spine met cancer  . Bone cancer Mother   . Arthritis Father   . Breast cancer Maternal Aunt   . Diabetes Other        parent & grandparent  . Hyperlipidemia Maternal Grandmother   . Bradycardia Son   . Heart Problems Daughter        enlarged heart  . Cancer - Prostate Brother   . Colon cancer Neg Hx   . Colon polyps Neg Hx     SOCHx:   reports that she quit smoking about 54 years ago. Her smoking use included cigarettes. She has never used smokeless tobacco. She reports that she drinks alcohol. She reports  that she does not use drugs.  ALLERGIES:  No Known Allergies  ROS: Pertinent items noted in HPI and remainder of comprehensive ROS otherwise negative.  HOME MEDS: Current Outpatient Medications  Medication Sig Dispense Refill  . Aspirin-Acetaminophen-Caffeine (GOODYS EXTRA STRENGTH PO) Take by mouth.    .Marland Kitchenatorvastatin (LIPITOR) 20 MG tablet Take 1 tablet (20 mg total) by mouth daily. 90 tablet 1  . fexofenadine (ALLEGRA ALLERGY) 60 MG tablet Take 1 tablet (60 mg total) by mouth 2 (two) times daily. (Patient taking differently: Take 60 mg by mouth 2 (two) times daily as needed. ) 30 tablet 12  . Loperamide HCl (IMODIUM PO) Take 1 tablet by mouth as needed (pt takes 2 times per week).     .Marland Kitchenomeprazole (PRILOSEC) 20 MG capsule Take 20  mg by mouth daily.    Marland Kitchen telmisartan-hydrochlorothiazide (MICARDIS HCT) 40-12.5 MG tablet TAKE 1 TABLET BY MOUTH DAILY 90 tablet 0   No current facility-administered medications for this visit.     LABS/IMAGING: No results found for this or any previous visit (from the past 48 hour(s)). No results found.  WEIGHTS: Wt Readings from Last 3 Encounters:  10/01/18 130 lb (59 kg)  09/17/18 129 lb 9.6 oz (58.8 kg)  08/20/18 132 lb 9.6 oz (60.1 kg)    VITALS: BP 118/60   Pulse 96   Ht '4\' 10"'$  (1.473 m)   Wt 130 lb (59 kg)   BMI 27.17 kg/m   EXAM: General appearance: alert and no distress Neck: no carotid bruit, no JVD and thyroid not enlarged, symmetric, no tenderness/mass/nodules Lungs: clear to auscultation bilaterally Heart: regular rate and rhythm, S1, S2 normal, no murmur, click, rub or gallop Abdomen: soft, non-tender; bowel sounds normal; no masses,  no organomegaly Extremities: extremities normal, atraumatic, no cyanosis or edema Pulses: 2+ and symmetric Skin: Skin color, texture, turgor normal. No rashes or lesions Neurologic: Grossly normal Psych: Pleasant  EKG: Sinus rhythm at 96, left bundle branch block-personally  reviewed  ASSESSMENT: 1. Progressive Dyspnea - LVEF 55-60% (2019), elevated LV filling pressure 2. LBBB 3. Palpitations 4. Hypertension-controlled 5. Dyslipidemia 6. Normal PFT's (2019) - bronchiectasis  PLAN: 1.   Mrs. Ahonen fortunately had normal PFTs although was noted to have bronchiectasis.  She is had progressive dyspnea and has left bundle branch block.  Remote work-up by nuclear stress testing was negative however she does have multivessel coronary artery calcification based on her recent CT of the chest.  Feel that further work-up to exclude obstructive coronary disease is indicated, especially since her reflux symptoms have improved with treatment but she is persistently dyspneic.  We will plan CT coronary angiography with FFR to further evaluate this.  Pixie Casino, MD, [facility], [place] Director of the [facility]  Card[facility] Diplomate of the American Board of Clinical Lipidology Attending Cardiologist   Direct Dial: [phone]  Fax: [phone]  Website:  [email or web address] 10/01/2018, 2:55 PM

## 2018-10-01 NOTE — Patient Instructions (Addendum)
Dr. Debara Pickett has ordered a coronary CT test. Instructions noted below.   Your physician wants you to follow-up in: 6 months with Dr. Debara Pickett. You will receive an email reminder via [facility] when it is time to schedule your next appointment. Please call our office to arrange this.   Please arrive at the [facility] main entrance of [facility] at_______ AM/PM (30-45 minutes prior to test start time)  [facility] [place], [place] [phone]  You will need blood work done about 1 week prior to your test (BMET). This is a non-fasting lab.  Proceed to the [facility] Radiology Department (First Floor).  Please follow these instructions carefully (unless otherwise directed):  On the Night Before the Test: . Be sure to Drink plenty of water. . Do not consume any caffeinated/decaffeinated beverages or chocolate 12 hours prior to your test. . Do not take any antihistamines 12 hours prior to your test. . If you take Metformin do not take 24 hours prior to test.  On the Day of the Test: . Drink plenty of water. Do not drink any water within one hour of the test. . Do not eat any food 4 hours prior to the test. . You may take your regular medications prior to the test. . IF NOT ON BETA BLOCKER-Take 50 mg of Lopressor (metoprolol) one hour before test  After the Test: . Drink plenty of water. . After receiving IV contrast, you may experience a mild flushed feeling. This is normal. . On occasion, you may experience a mild rash up to 24 hours after the test. This is not dangerous. If this occurs, you can take Benadryl 25 mg and increase your fluid intake. . If you experience trouble breathing, this can be serious. If it is severe call 911 IMMEDIATELY. If it is mild, please call our office. . If you take any of these medications: Glipizide/Metformin, Avandament, Glucavance, please do not take 48 hours after completing test.

## 2018-10-12 NOTE — Progress Notes (Signed)
Subjective:    Patient ID: Regina Ramsey, female    DOB: [birth year], 77 y.o.   MRN: [id number]  HPI The patient is here for an acute visit.   Right foot pain:  She has plantar fasciitis and a bone spur.  She has seen podiatry.  She wears a brace, which does help.  She had an injection and it lasted 6 months.  She is interested in getting another opinion regarding the pain.  She would like to see an orthopedic.  Right hip pain:  It hurt only with climbing stairs but now it hurts with walking.  She did see an orthopedic one year ago and she had an injection and it lasted one year.  The pain is on the lateral aspect of the hip.  It feels like the hip will go out of joint.  She denies back pain or groin pain.  She has N/ T in her right leg after sitting for 5 minutes.     Hypertension;  She stopped taking the temisartan-hctz -- the pills were falling apart.  She was concerned about the possible side effect of diarrhea.  She deals with chronic diarrhea and has to be careful of what she eats and does not want to take any medication with the possible side effect of diarrhea.  She denies any chest pain, palpitations, leg swelling, headaches and lightheadedness.  She does have chronic shortness of breath with exertion, which is unchanged.  Medications and allergies reviewed with patient and updated if appropriate.  Patient Active Problem List   Diagnosis Date Noted  . Plantar fasciitis 10/14/2018  . Right hip pain 10/14/2018  . Coronary artery calcification 10/01/2018  . B12 deficiency 05/08/2018  . IBS (irritable bowel syndrome) 06/03/2017  . Low back pain 10/11/2016  . DOE (dyspnea on exertion) 05/31/2016  . LBBB (left bundle branch block) 05/31/2016  . Prediabetes 04/15/2016  . Palpitations 04/11/2016  . History of colonic polyps 03/07/2015  . Vaginal atrophy 07/23/2014  . Dyspareunia 07/23/2014  . Hx of endometriosis 06/30/2014  . Diverticulosis of colon 06/30/2014  .  Osteoporosis 08/16/2010  . Hyperlipidemia 07/13/2010  . ANEMIA-NOS 07/13/2010  . MIGRAINE HEADACHE 07/13/2010  . HEARING LOSS 07/13/2010  . Essential hypertension 07/13/2010  . Allergic rhinitis 07/13/2010  . Asthma 07/13/2010  . GERD 07/13/2010    Current Outpatient Medications on File Prior to Visit  Medication Sig Dispense Refill  . Aspirin-Acetaminophen-Caffeine (GOODYS EXTRA STRENGTH PO) Take by mouth.    Marland Kitchen atorvastatin (LIPITOR) 20 MG tablet Take 1 tablet (20 mg total) by mouth daily. 90 tablet 1  . fexofenadine (ALLEGRA ALLERGY) 60 MG tablet Take 1 tablet (60 mg total) by mouth 2 (two) times daily. (Patient taking differently: Take 60 mg by mouth 2 (two) times daily as needed. ) 30 tablet 12  . Loperamide HCl (IMODIUM PO) Take 1 tablet by mouth as needed (pt takes 2 times per week).     . metoprolol tartrate (LOPRESSOR) 50 MG tablet TAKE 1 TABLET BY MOUTH 1 HOUR PRIOR TO TEST 90 tablet 1  . omeprazole (PRILOSEC) 20 MG capsule Take 20 mg by mouth daily.     No current facility-administered medications on file prior to visit.     Past Medical History:  Diagnosis Date  . ALLERGIC RHINITIS   . ANEMIA-NOS   . Anxiety   . Asthma    Triggered by allergies  . COPD    normal PFTs 06/22/11  . Diverticulosis of colon   .  Dyspareunia   . Endometriosis   . GERD   . HEARING LOSS 07/13/2010  . HYPERLIPIDEMIA   . HYPERTENSION   . Irritable bowel syndrome   . MIGRAINE HEADACHE   . OSTEOPOROSIS    on Prolia q 51mo hx vertebral fx  . PALPITATIONS, HX OF   . Vaginal atrophy     Past Surgical History:  Procedure Laterality Date  . ABDOMINAL HYSTERECTOMY  1974  . APPENDECTOMY  1958  . BREAST SURGERY  1985   breast biopsy    Social History   Socioeconomic History  . Marital status: Married    Spouse name: Not on file  . Number of children: 2  . Years of education: Not on file  . Highest education level: Not on file  Occupational History  . Occupation: Retired  SPhotographer . Financial resource strain: Not on file  . Food insecurity:    Worry: Not on file    Inability: Not on file  . Transportation needs:    Medical: Not on file    Non-medical: Not on file  Tobacco Use  . Smoking status: Former Smoker    Types: Cigarettes    Last attempt to quit: 01/01/1964    Years since quitting: 54.8  . Smokeless tobacco: Never Used  . Tobacco comment: Married, lives with souse. Master of education-specialist needs for grade school  Substance and Sexual Activity  . Alcohol use: Yes    Alcohol/week: 0.0 standard drinks    Comment: Social  . Drug use: No  . Sexual activity: Not on file  Lifestyle  . Physical activity:    Days per week: Not on file    Minutes per session: Not on file  . Stress: Not on file  Relationships  . Social connections:    Talks on phone: Not on file    Gets together: Not on file    Attends religious service: Not on file    Active member of club or organization: Not on file    Attends meetings of clubs or organizations: Not on file    Relationship status: Not on file  Other Topics Concern  . Not on file  Social History Narrative   Retired tPharmacist, hospital     No regular exercise, active      epworth sleepiness scale = 10 (05/30/16)    Family History  Adopted: Yes  Problem Relation Age of Onset  . Arthritis Mother   . Breast cancer Mother   . Cancer Mother        started on her spine met cancer  . Bone cancer Mother   . Arthritis Father   . Breast cancer Maternal Aunt   . Diabetes Other        parent & grandparent  . Hyperlipidemia Maternal Grandmother   . Bradycardia Son   . Heart Problems Daughter        enlarged heart  . Cancer - Prostate Brother   . Colon cancer Neg Hx   . Colon polyps Neg Hx     Review of Systems  Respiratory: Positive for shortness of breath (chronic - no change).   Cardiovascular: Negative for chest pain, palpitations and leg swelling.  Neurological: Negative for light-headedness and  headaches.       Objective:   Vitals:   10/14/18 0929  BP: (!) 164/94  Pulse: 78  Resp: 16  Temp: 98 F (36.7 C)  SpO2: 97%   BP Readings from Last 3 Encounters:  10/14/18 (!) 164/94  10/01/18 118/60  09/17/18 120/60   Wt Readings from Last 3 Encounters:  10/14/18 132 lb (59.9 kg)  10/01/18 130 lb (59 kg)  09/17/18 129 lb 9.6 oz (58.8 kg)   Body mass index is 27.59 kg/m.   Physical Exam    Constitutional: Appears well-developed and well-nourished. No distress.  HENT:  Head: Normocephalic and atraumatic.  Neck: Neck supple. No tracheal deviation present. No thyromegaly present.  No cervical lymphadenopathy Cardiovascular: Normal rate, regular rhythm and normal heart sounds.   2/6 systolic murmur heard. No carotid bruit .  No edema Pulmonary/Chest: Effort normal and breath sounds normal. No respiratory distress. No has no wheezes. No rales.  Skin: Skin is warm and dry. Not diaphoretic.  Psychiatric: Normal mood and affect. Behavior is normal.       Assessment & Plan:    See Problem List for Assessment and Plan of chronic medical problems.

## 2018-10-14 ENCOUNTER — Ambulatory Visit: Payer: Medicare Other | Admitting: Internal Medicine

## 2018-10-14 ENCOUNTER — Encounter: Payer: Self-pay | Admitting: Internal Medicine

## 2018-10-14 VITALS — BP 164/94 | HR 78 | Temp 98.0°F | Resp 16 | Ht <= 58 in | Wt 132.0 lb

## 2018-10-14 DIAGNOSIS — M25551 Pain in right hip: Secondary | ICD-10-CM | POA: Diagnosis not present

## 2018-10-14 DIAGNOSIS — I10 Essential (primary) hypertension: Secondary | ICD-10-CM | POA: Diagnosis not present

## 2018-10-14 DIAGNOSIS — M722 Plantar fascial fibromatosis: Secondary | ICD-10-CM | POA: Diagnosis not present

## 2018-10-14 MED ORDER — AMLODIPINE BESYLATE 5 MG PO TABS: 5.0000 mg | ORAL_TABLET | Freq: Every day | ORAL | 3 refills | Status: DC

## 2018-10-14 NOTE — Assessment & Plan Note (Signed)
Right heel along with a bone spur Has seen podiatry and has had some treatment for it She is interested in getting another opinion from an orthopedic-referred today

## 2018-10-14 NOTE — Assessment & Plan Note (Signed)
Stop telmisartan-hydrochlorothiazide due to the possibility of a side effect of diarrhea Continue metoprolol, which can also cause diarrhea but she seems to be doing well with Will start amlodipine 5 mg daily Advised that she needs to monitor her blood pressure regularly at home to make sure it is well controlled-discussed goal of less than 140/90 consistently Most likely she will need more medication, but will hold off until we see how she does with the amlodipine

## 2018-10-14 NOTE — Patient Instructions (Addendum)
Monitor your BP at home - it should be less than 140/90 consistently.    Medications reviewed and updated.  Changes include :   Starting amlodipine 5 mg daily  Your prescription(s) have been submitted to your pharmacy. Please take as directed and contact our office if you believe you are having problem(s) with the medication(s).  A referral was ordered for Ortho for your right hip pain and right foot pain

## 2018-10-14 NOTE — Assessment & Plan Note (Signed)
Having right hip pain on the lateral aspect of her hip Possible bursitis Is interested in seeing orthopedic for further evaluation-referred

## 2018-10-16 ENCOUNTER — Encounter: Payer: Self-pay | Admitting: Family Medicine

## 2018-10-16 ENCOUNTER — Ambulatory Visit: Payer: Medicare Other | Admitting: Family Medicine

## 2018-10-16 VITALS — BP 138/76 | HR 92 | Temp 98.2°F | Ht <= 58 in | Wt 132.0 lb

## 2018-10-16 DIAGNOSIS — M25551 Pain in right hip: Secondary | ICD-10-CM | POA: Diagnosis not present

## 2018-10-16 MED ORDER — PREDNISONE 10 MG PO TABS: ORAL_TABLET | ORAL | 0 refills | Status: DC

## 2018-10-16 NOTE — Patient Instructions (Signed)
Please take prednisone with food as directed. A referral to orthopedics has been placed by Dr. [name].  You will be contact about your referral.  Please let us know if you have not heard back within 1 week about your referral.  You can consider using ice for no more than 15 minutes then removing and you can try ice again in one hour if needed.   Hip Pain The hip is the joint between the upper legs and the lower pelvis. The bones, cartilage, tendons, and muscles of your hip joint support your body and allow you to move around. Hip pain can range from a minor ache to severe pain in one or both of your hips. The pain may be felt on the inside of the hip joint near the groin, or the outside near the buttocks and upper thigh. You may also have swelling or stiffness. Follow these instructions at home: Managing pain, stiffness, and swelling  If directed, apply ice to the injured area. ? Put ice in a plastic bag. ? Place a towel between your skin and the bag. ? Leave the ice on for 20 minutes, 2-3 times a day  Sleep with a pillow between your legs on your most comfortable side.  Avoid any activities that cause pain. General instructions  Take over-the-counter and prescription medicines only as told by your health care provider.  Do any exercises as told by your health care provider.  Record the following: ? How often you have hip pain. ? The location of your pain. ? What the pain feels like. ? What makes the pain worse.  Keep all follow-up visits as told by your health care provider. This is important. Contact a health care provider if:  You cannot put weight on your leg.  Your pain or swelling continues or gets worse after one week.  It gets harder to walk.  You have a fever. Get help right away if:  You fall.  You have a sudden increase in pain and swelling in your hip.  Your hip is red or swollen or very tender to touch. Summary  Hip pain can range from a minor ache to  severe pain in one or both of your hips.  The pain may be felt on the inside of the hip joint near the groin, or the outside near the buttocks and upper thigh.  Avoid any activities that cause pain.  Record how often you have hip pain, the location of the pain, what makes it worse and what it feels like. This information is not intended to replace advice given to you by your health care provider. Make sure you discuss any questions you have with your health care provider. Document Released: 06/06/2010 Document Revised: 11/19/2016 Document Reviewed: 11/19/2016 Elsevier Interactive Patient Education  [employer].

## 2018-10-16 NOTE — Progress Notes (Signed)
Subjective:    Patient ID: Regina Ramsey, female    DOB: May 14, 1941, 77 y.o.   MRN: [id number]  HPI  Regina Ramsey is a 77 year old female who presents with right thigh pain that started one day ago after experiencing pain in her hip. She reports right hip pain was evaluated 2 days ago and she was referred to orthopedics.  History of hip pain previously occurred with climbing stairs however it has progressed and now hurts with walking at this time. She has been evaluated and treated with an injection by an orthopedic provider one year ago which provided great benefit.  Pain is on lateral aspect of hip, rated as an 8 or 9 but can be as high as a 10 when she first ambulates. Described as aching. She denies denies numbness/tingling in hip or thigh today.  No history of trauma or surgery on this area.  She has not taken any medications for pain at this time.   Review of Systems  Constitutional: Negative for chills, fatigue and fever.  Respiratory: Negative for cough, shortness of breath and wheezing.   Cardiovascular: Negative for chest pain and palpitations.  Gastrointestinal: Negative for abdominal pain.  Musculoskeletal:       Right hip/thigh pain  Neurological: Negative for dizziness and weakness.   Past Medical History:  Diagnosis Date  . ALLERGIC RHINITIS   . ANEMIA-NOS   . Anxiety   . Asthma    Triggered by allergies  . COPD    normal PFTs 06/22/11  . Diverticulosis of colon   . Dyspareunia   . Endometriosis   . GERD   . HEARING LOSS 07/13/2010  . HYPERLIPIDEMIA   . HYPERTENSION   . Irritable bowel syndrome   . MIGRAINE HEADACHE   . OSTEOPOROSIS    on Prolia q 41mo hx vertebral fx  . PALPITATIONS, HX OF   . Vaginal atrophy      Social History   Socioeconomic History  . Marital status: Married    Spouse name: Not on file  . Number of children: 2  . Years of education: Not on file  . Highest education level: Not on file  Occupational History  . Occupation:  Retired  SScientific laboratory technician . Financial resource strain: Not on file  . Food insecurity:    Worry: Not on file    Inability: Not on file  . Transportation needs:    Medical: Not on file    Non-medical: Not on file  Tobacco Use  . Smoking status: Former Smoker    Types: Cigarettes    Last attempt to quit: 01/01/1964    Years since quitting: 54.8  . Smokeless tobacco: Never Used  . Tobacco comment: Married, lives with souse. Master of education-specialist needs for grade school  Substance and Sexual Activity  . Alcohol use: Yes    Alcohol/week: 0.0 standard drinks    Comment: Social  . Drug use: No  . Sexual activity: Not on file  Lifestyle  . Physical activity:    Days per week: Not on file    Minutes per session: Not on file  . Stress: Not on file  Relationships  . Social connections:    Talks on phone: Not on file    Gets together: Not on file    Attends religious service: Not on file    Active member of club or organization: Not on file    Attends meetings of clubs or organizations: Not on file  Relationship status: Not on file  . Intimate partner violence:    Fear of current or ex partner: Not on file    Emotionally abused: Not on file    Physically abused: Not on file    Forced sexual activity: Not on file  Other Topics Concern  . Not on file  Social History Narrative   Retired Pharmacist, hospital      No regular exercise, active      epworth sleepiness scale = 10 (05/30/16)    Past Surgical History:  Procedure Laterality Date  . ABDOMINAL HYSTERECTOMY  1974  . APPENDECTOMY  1958  . BREAST SURGERY  1985   breast biopsy    Family History  Adopted: Yes  Problem Relation Age of Onset  . Arthritis Mother   . Breast cancer Mother   . Cancer Mother        started on her spine met cancer  . Bone cancer Mother   . Arthritis Father   . Breast cancer Maternal Aunt   . Diabetes Other        parent & grandparent  . Hyperlipidemia Maternal Grandmother   . Bradycardia Son     . Heart Problems Daughter        enlarged heart  . Cancer - Prostate Brother   . Colon cancer Neg Hx   . Colon polyps Neg Hx     No Known Allergies  Current Outpatient Medications on File Prior to Visit  Medication Sig Dispense Refill  . amLODipine (NORVASC) 5 MG tablet Take 1 tablet (5 mg total) by mouth daily. 30 tablet 3  . Aspirin-Acetaminophen-Caffeine (GOODYS EXTRA STRENGTH PO) Take by mouth.    Marland Kitchen atorvastatin (LIPITOR) 20 MG tablet Take 1 tablet (20 mg total) by mouth daily. 90 tablet 1  . fexofenadine (ALLEGRA ALLERGY) 60 MG tablet Take 1 tablet (60 mg total) by mouth 2 (two) times daily. (Patient taking differently: Take 60 mg by mouth 2 (two) times daily as needed. ) 30 tablet 12  . Loperamide HCl (IMODIUM PO) Take 1 tablet by mouth as needed (pt takes 2 times per week).     . metoprolol tartrate (LOPRESSOR) 50 MG tablet TAKE 1 TABLET BY MOUTH 1 HOUR PRIOR TO TEST 90 tablet 1  . omeprazole (PRILOSEC) 20 MG capsule Take 20 mg by mouth daily.     No current facility-administered medications on file prior to visit.     BP 138/76 (BP Location: Left Arm, Patient Position: Sitting, Cuff Size: Normal)   Pulse 92   Temp 98.2 F (36.8 C) (Oral)   Ht _0  (1.473 m)   Wt 132 lb (59.9 kg)   SpO2 97%   BMI 27.59 kg/m       Objective:   Physical Exam  Constitutional: She is oriented to person, place, and time. She appears well-developed and well-nourished.  Eyes: Pupils are equal, round, and reactive to light. No scleral icterus.  Neck: Neck supple.  Cardiovascular: Normal rate and regular rhythm.  Murmur heard. Pulmonary/Chest: Effort normal and breath sounds normal. She has no wheezes. She has no rales.  Abdominal: Soft. Bowel sounds are normal. There is no tenderness.  Musculoskeletal:  Hip Pelvic alignment unremarkable to inspection and palpation. Standing hip rotation and gait without trendelenburg / unsteadiness. Tenderness to palpation over greater trochanter and  posterior thigh.  ROM limited due to discomfort during exam. No weakness appreciated. Waddling gait    Lymphadenopathy:    She has no cervical adenopathy.  Neurological: She is alert and oriented to person, place, and time.  Skin: Skin is warm and dry.  Psychiatric: She has a normal mood and affect. Her behavior is normal. Judgment and thought content normal.       Assessment & Plan:  1. Right hip pain Exam is most consistent with inflammatory process such as greater trochanter pain/bursisits with radiation to thigh. Prior treatment approximately one year ago per patient by orthopedic provided great benefit. She has been referred by PCP to orthopedics for further evaluation. She is interested in help with pain prior to this appointment.We reviewed that symptoms are most likely inflammatory in nature. NSAIDs avoided due to history of HTN and GERD. Patient is not interested in taking NSAIDs also. Opted for a prednisone taper with food and advised patient to contact this office if she has not heard about an appointment from her referral in 7 business days. She voiced understanding and agreed with plan.  - predniSONE (DELTASONE) 10 MG tablet; Take 4 tablets daily for 4 days, 3 tabs daily for 2 days, 2 tabs daily for 2 days, and one tab daily for 2 days.  Dispense: 28 tablet; Refill: 0  Delano Metz, FNP-C

## 2018-10-22 LAB — BASIC METABOLIC PANEL
BUN/Creatinine Ratio: 17 (ref 12–28)
BUN: 15 mg/dL (ref 8–27)
CO2: 22 mmol/L (ref 20–29)
CREATININE: 0.88 mg/dL (ref 0.57–1.00)
Calcium: 8.7 mg/dL (ref 8.7–10.3)
Chloride: 103 mmol/L (ref 96–106)
GFR calc Af Amer: 73 mL/min/{1.73_m2} (ref >59)
GFR, EST NON AFRICAN AMERICAN: 64 mL/min/{1.73_m2} (ref >59)
GLUCOSE: 118 mg/dL — AB (ref 65–99)
Potassium: 3.7 mmol/L (ref 3.5–5.2)
SODIUM: 144 mmol/L (ref 134–144)

## 2018-10-25 DIAGNOSIS — M4316 Spondylolisthesis, lumbar region: Secondary | ICD-10-CM | POA: Insufficient documentation

## 2018-10-28 ENCOUNTER — Ambulatory Visit (HOSPITAL_COMMUNITY)
Admission: RE | Admit: 2018-10-28 | Discharge: 2018-10-28 | Disposition: A | Discharge status: Home / Self Care | Payer: Medicare Other | Source: Ambulatory Visit | Attending: Internal Medicine | Admitting: Internal Medicine

## 2018-10-28 ENCOUNTER — Ambulatory Visit (HOSPITAL_COMMUNITY): Payer: Medicare Other

## 2018-10-28 ENCOUNTER — Encounter (HOSPITAL_COMMUNITY): Payer: Self-pay

## 2018-10-28 DIAGNOSIS — K449 Diaphragmatic hernia without obstruction or gangrene: Secondary | ICD-10-CM | POA: Diagnosis not present

## 2018-10-28 DIAGNOSIS — I2584 Coronary atherosclerosis due to calcified coronary lesion: Secondary | ICD-10-CM | POA: Insufficient documentation

## 2018-10-28 DIAGNOSIS — R0609 Other forms of dyspnea: Secondary | ICD-10-CM | POA: Insufficient documentation

## 2018-10-28 DIAGNOSIS — I447 Left bundle-branch block, unspecified: Secondary | ICD-10-CM

## 2018-10-28 DIAGNOSIS — I2510 Atherosclerotic heart disease of native coronary artery without angina pectoris: Secondary | ICD-10-CM | POA: Insufficient documentation

## 2018-10-28 MED ORDER — IOPAMIDOL (ISOVUE-370) INJECTION 76%
100.0000 mL | Freq: Once | INTRAVENOUS | Status: AC | PRN
  Administered 2018-10-28: 100 mL via INTRAVENOUS

## 2018-10-28 MED ORDER — NITROGLYCERIN 0.4 MG SL SUBL
0.8000 mg | SUBLINGUAL_TABLET | Freq: Once | SUBLINGUAL | Status: AC
  Administered 2018-10-28: 0.8 mg via SUBLINGUAL
  Filled 2018-10-28: qty 25

## 2018-10-28 MED ORDER — METOPROLOL TARTRATE 5 MG/5ML IV SOLN
INTRAVENOUS | Status: AC
  Filled 2018-10-28: qty 20

## 2018-10-28 MED ORDER — METOPROLOL TARTRATE 5 MG/5ML IV SOLN
10.0000 mg | INTRAVENOUS | Status: DC | PRN
  Administered 2018-10-28 (×2): 10 mg via INTRAVENOUS
  Filled 2018-10-28: qty 10

## 2018-10-28 MED ORDER — NITROGLYCERIN 0.4 MG SL SUBL
SUBLINGUAL_TABLET | SUBLINGUAL | Status: AC
  Filled 2018-10-28: qty 1

## 2018-10-28 NOTE — Progress Notes (Signed)
CT complete. Partient denies any complaints. Offered patient snack and beverage, patient stated she was going to get lunch after.

## 2018-10-28 NOTE — Progress Notes (Signed)
Patient ambulatory out of deparment with steady gait and no complaints.

## 2018-10-31 DIAGNOSIS — I2510 Atherosclerotic heart disease of native coronary artery without angina pectoris: Secondary | ICD-10-CM

## 2018-10-31 DIAGNOSIS — I2584 Coronary atherosclerosis due to calcified coronary lesion: Secondary | ICD-10-CM | POA: Diagnosis not present

## 2018-11-04 ENCOUNTER — Ambulatory Visit (HOSPITAL_COMMUNITY)
Admission: RE | Admit: 2018-11-04 | Discharge: 2018-11-04 | Disposition: A | Discharge status: Home / Self Care | Payer: Medicare Other | Source: Ambulatory Visit | Attending: Internal Medicine | Admitting: Internal Medicine

## 2018-11-04 DIAGNOSIS — I2510 Atherosclerotic heart disease of native coronary artery without angina pectoris: Secondary | ICD-10-CM | POA: Diagnosis not present

## 2018-11-04 DIAGNOSIS — R0609 Other forms of dyspnea: Secondary | ICD-10-CM | POA: Insufficient documentation

## 2018-11-04 DIAGNOSIS — I2584 Coronary atherosclerosis due to calcified coronary lesion: Secondary | ICD-10-CM | POA: Diagnosis not present

## 2018-11-04 DIAGNOSIS — I447 Left bundle-branch block, unspecified: Secondary | ICD-10-CM | POA: Diagnosis not present

## 2018-11-11 NOTE — Progress Notes (Signed)
Subjective:    Patient ID: Regina Ramsey, female    DOB: [birth year], 77 y.o.   MRN: [id number]  HPI The patient is here for follow up.  Hypertension: she was started on amlodipine one month ago.  She is taking her medication daily. Her feet have been swelling in the evening.  She is compliant with a low sodium diet.  She still has some shortness of breath, but it is better than it used to be.  She will be seeing cardiology for a follow-up after having cardiac testing.  She denies chest pain, palpitations and regular headaches. She is not exercising regularly.  She does monitor her blood pressure at home - 150-160-/80.    Prediabetes:  She is compliant with a low sugar/carbohydrate diet.  She is not exercising regularly.  Hyperlipidemia: She is taking her medication daily. She is compliant with a low fat/cholesterol diet. She is not exercising regularly. She denies myalgias.     Medications and allergies reviewed with patient and updated if appropriate.  Patient Active Problem List   Diagnosis Date Noted  . Plantar fasciitis 10/14/2018  . Right hip pain 10/14/2018  . Coronary artery calcification 10/01/2018  . B12 deficiency 05/08/2018  . IBS (irritable bowel syndrome) 06/03/2017  . Low back pain 10/11/2016  . DOE (dyspnea on exertion) 05/31/2016  . LBBB (left bundle branch block) 05/31/2016  . Prediabetes 04/15/2016  . Palpitations 04/11/2016  . History of colonic polyps 03/07/2015  . Vaginal atrophy 07/23/2014  . Dyspareunia 07/23/2014  . Hx of endometriosis 06/30/2014  . Diverticulosis of colon 06/30/2014  . Osteoporosis 08/16/2010  . Hyperlipidemia 07/13/2010  . ANEMIA-NOS 07/13/2010  . MIGRAINE HEADACHE 07/13/2010  . HEARING LOSS 07/13/2010  . Essential hypertension 07/13/2010  . Allergic rhinitis 07/13/2010  . Asthma 07/13/2010  . GERD 07/13/2010    Current Outpatient Medications on File Prior to Visit  Medication Sig Dispense Refill  . amLODipine (NORVASC)  5 MG tablet Take 1 tablet (5 mg total) by mouth daily. 30 tablet 3  . Aspirin-Acetaminophen-Caffeine (GOODYS EXTRA STRENGTH PO) Take by mouth.    Marland Kitchen atorvastatin (LIPITOR) 20 MG tablet Take 1 tablet (20 mg total) by mouth daily. 90 tablet 1  . Loperamide HCl (IMODIUM PO) Take 1 tablet by mouth as needed (pt takes 2 times per week).     Marland Kitchen omeprazole (PRILOSEC) 20 MG capsule Take 20 mg by mouth daily.     No current facility-administered medications on file prior to visit.     Past Medical History:  Diagnosis Date  . ALLERGIC RHINITIS   . ANEMIA-NOS   . Anxiety   . Asthma    Triggered by allergies  . COPD    normal PFTs 06/22/11  . Diverticulosis of colon   . Dyspareunia   . Endometriosis   . GERD   . HEARING LOSS 07/13/2010  . HYPERLIPIDEMIA   . HYPERTENSION   . Irritable bowel syndrome   . MIGRAINE HEADACHE   . OSTEOPOROSIS    on Prolia q 8mo hx vertebral fx  . PALPITATIONS, HX OF   . Vaginal atrophy     Past Surgical History:  Procedure Laterality Date  . ABDOMINAL HYSTERECTOMY  1974  . APPENDECTOMY  1958  . BREAST SURGERY  1985   breast biopsy    Social History   Socioeconomic History  . Marital status: Married    Spouse name: Not on file  . Number of children: 2  . Years of education:  Not on file  . Highest education level: Not on file  Occupational History  . Occupation: Retired  Scientific laboratory technician  . Financial resource strain: Not on file  . Food insecurity:    Worry: Not on file    Inability: Not on file  . Transportation needs:    Medical: Not on file    Non-medical: Not on file  Tobacco Use  . Smoking status: Former Smoker    Types: Cigarettes    Last attempt to quit: 01/01/1964    Years since quitting: 54.9  . Smokeless tobacco: Never Used  . Tobacco comment: Married, lives with souse. Master of education-specialist needs for grade school  Substance and Sexual Activity  . Alcohol use: Yes    Alcohol/week: 0.0 standard drinks    Comment: Social  .  Drug use: No  . Sexual activity: Not on file  Lifestyle  . Physical activity:    Days per week: Not on file    Minutes per session: Not on file  . Stress: Not on file  Relationships  . Social connections:    Talks on phone: Not on file    Gets together: Not on file    Attends religious service: Not on file    Active member of club or organization: Not on file    Attends meetings of clubs or organizations: Not on file    Relationship status: Not on file  Other Topics Concern  . Not on file  Social History Narrative   Retired Pharmacist, hospital      No regular exercise, active      epworth sleepiness scale = 10 (05/30/16)    Family History  Adopted: Yes  Problem Relation Age of Onset  . Arthritis Mother   . Breast cancer Mother   . Cancer Mother        started on her spine met cancer  . Bone cancer Mother   . Arthritis Father   . Breast cancer Maternal Aunt   . Diabetes Other        parent & grandparent  . Hyperlipidemia Maternal Grandmother   . Bradycardia Son   . Heart Problems Daughter        enlarged heart  . Cancer - Prostate Brother   . Colon cancer Neg Hx   . Colon polyps Neg Hx     Review of Systems  Respiratory: Positive for shortness of breath (some - better than it used to be).   Cardiovascular: Positive for leg swelling. Negative for chest pain and palpitations.  Neurological: Negative for light-headedness and headaches.       Objective:   Vitals:   11/12/18 1358  BP: (!) 174/84  Pulse: 91  Resp: 16  Temp: 98.2 F (36.8 C)  SpO2: 98%   BP Readings from Last 3 Encounters:  11/12/18 (!) 174/84  10/28/18 140/78  10/16/18 138/76   Wt Readings from Last 3 Encounters:  11/12/18 133 lb (60.3 kg)  10/16/18 132 lb (59.9 kg)  10/14/18 132 lb (59.9 kg)   Body mass index is 27.8 kg/m.   Physical Exam    Constitutional: Appears well-developed and well-nourished. No distress.  HENT:  Head: Normocephalic and atraumatic.  Neck: Neck supple. No tracheal  deviation present. No thyromegaly present.  No cervical lymphadenopathy Cardiovascular: Normal rate, regular rhythm and normal heart sounds.   No murmur heard. No carotid bruit .  No edema Pulmonary/Chest: Effort normal and breath sounds normal. No respiratory distress. No has no wheezes. No  rales.  Skin: Skin is warm and dry. Not diaphoretic.  Psychiatric: Normal mood and affect. Behavior is normal.      Assessment & Plan:    See Problem List for Assessment and Plan of chronic medical problems.

## 2018-11-12 ENCOUNTER — Ambulatory Visit: Payer: Medicare Other | Admitting: Internal Medicine

## 2018-11-12 ENCOUNTER — Encounter: Payer: Self-pay | Admitting: Internal Medicine

## 2018-11-12 VITALS — BP 174/84 | HR 91 | Temp 98.2°F | Resp 16 | Ht <= 58 in | Wt 133.0 lb

## 2018-11-12 DIAGNOSIS — E782 Mixed hyperlipidemia: Secondary | ICD-10-CM | POA: Diagnosis not present

## 2018-11-12 DIAGNOSIS — I10 Essential (primary) hypertension: Secondary | ICD-10-CM

## 2018-11-12 DIAGNOSIS — R7303 Prediabetes: Secondary | ICD-10-CM

## 2018-11-12 MED ORDER — METOPROLOL SUCCINATE ER 25 MG PO TB24: 25.0000 mg | ORAL_TABLET | Freq: Every day | ORAL | 3 refills | Status: DC

## 2018-11-12 NOTE — Assessment & Plan Note (Signed)
Sugars have been stable in the prediabetic range so we will hold off on blood work until her next visit Stressed the importance of regular exercise Encouraged low sugar/carbohydrate diet Follow-up in 6 months

## 2018-11-12 NOTE — Patient Instructions (Addendum)
  Medications reviewed and updated.  Changes include :   Start metoprolol 25 mg daily.  Your prescription(s) have been submitted to your pharmacy. Please take as directed and contact our office if you believe you are having problem(s) with the medication(s).    Please followup in 6 months

## 2018-11-12 NOTE — Assessment & Plan Note (Addendum)
Blood pressure still elevated-not ideally controlled Continue amlodipine 5 mg-will not increase given mild ankle edema Start metoprolol ER 25 mg a day Goal is 140/90 - monitor BP at home Will be seen cardiology in the next month or 2-can have blood pressure rechecked at that time Follow-up with me in 6 months, sooner if blood pressure is not controlled

## 2018-11-12 NOTE — Assessment & Plan Note (Signed)
Continue statin We will hold off on blood work until her next visit Encouraged regular exercise

## 2018-11-21 ENCOUNTER — Encounter: Payer: Self-pay | Admitting: Internal Medicine

## 2018-11-21 ENCOUNTER — Ambulatory Visit: Payer: Medicare Other | Admitting: Internal Medicine

## 2018-11-21 VITALS — BP 176/76 | HR 78 | Ht <= 58 in | Wt 129.2 lb

## 2018-11-21 DIAGNOSIS — I447 Left bundle-branch block, unspecified: Secondary | ICD-10-CM

## 2018-11-21 DIAGNOSIS — R0602 Shortness of breath: Secondary | ICD-10-CM | POA: Diagnosis not present

## 2018-11-21 DIAGNOSIS — Z01812 Encounter for preprocedural laboratory examination: Secondary | ICD-10-CM | POA: Diagnosis not present

## 2018-11-21 DIAGNOSIS — I739 Peripheral vascular disease, unspecified: Secondary | ICD-10-CM

## 2018-11-21 DIAGNOSIS — I2510 Atherosclerotic heart disease of native coronary artery without angina pectoris: Secondary | ICD-10-CM | POA: Diagnosis not present

## 2018-11-21 NOTE — Patient Instructions (Addendum)
Medication Instructions:  START aspirin 81mg  daily Continue other medications If you need a refill on your cardiac medications before your next appointment, please call your pharmacy.   Lab work: NON-FASTING lab work prior to heart cath (can be done today) - CBC, BMET If you have labs (blood work) drawn today and your tests are completely normal, you will receive your results only by: Marland Kitchen MyChart Message (if you have MyChart) OR . A paper copy in the mail If you have any lab test that is abnormal or we need to change your treatment, we will call you to review the results.  Testing/Procedures: Your physician has requested that you have a cardiac catheterization. Cardiac catheterization is used to diagnose and/or treat various heart conditions. Doctors may recommend this procedure for a number of different reasons. The most common reason is to evaluate chest pain. Chest pain can be a symptom of coronary artery disease (CAD), and cardiac catheterization can show whether plaque is narrowing or blocking your heart's arteries. This procedure is also used to evaluate the valves, as well as measure the blood flow and oxygen levels in different parts of your heart.   Instructions noted below  Your physician has requested that you have a lower extremity arterial duplex. This test is an ultrasound of the arteries in the legs. It looks at arterial blood flow in the legs. Allow one hour for Lower Arterial scans. There are no restrictions or special instructions   Follow-Up: At [facility], you and your health needs are our priority.  As part of our continuing mission to provide you with exceptional heart care, we have created designated Provider Care Teams.  These Care Teams include your primary Cardiologist (physician) and Advanced Practice Providers (APPs -  Physician Assistants and Nurse Practitioners) who all work together to provide you with the care you need, when you need it. You will need a follow  up appointment in 4 weeks after heart cath. You may see Dr. Debara Pickett or one of the following Advanced Practice Providers on your designated Care Team: Almyra Deforest, [place] . Fabian Sharp, PA-C  Any Other Special Instructions Will Be Listed Below (If Applicable).     [address] [place] [address] Dept: [phone] Loc: [place]  11/21/2018  You are scheduled for a Cardiac Catheterization on Wednesday, December 4 with Dr. Shelva Majestic.  1. Please arrive at the [facility] (Main Entrance A) at [facility]: [address] [place], [place] [address] at 6:30 AM (This time is two hours before your procedure to ensure your preparation). Free valet parking service is available.   Special note: Every effort is made to have your procedure done on time. Please understand that emergencies sometimes delay scheduled procedures.  2. Diet: Do not eat solid foods after midnight.  You may have clear liquids until 5am upon the day of the procedure.  3. Labs: You will need to have blood drawn TODAY  4. Medication instructions in preparation for your procedure:  On the morning of your procedure, take your Aspirin and any morning medicines NOT listed above.  You may use sips of water.  5. Plan for one night stay--bring personal belongings. 6. Bring a current list of your medications and current insurance cards. 7. You MUST have a responsible person to drive you home. 8. Someone MUST be with you the first 24 hours after you arrive home  or your discharge will be delayed. 9. Please wear clothes that are easy to get on and off and wear slip-on shoes.  Thank you for allowing [place] to care for you!   -- [facility]

## 2018-11-21 NOTE — H&P (View-Only) (Signed)
OFFICE NOTE  Chief Complaint:  Follow-up CT angiogram  Primary Care Physician: Binnie Rail, MD  HPI:  Regina Ramsey is a 77 y.o. female who is kindly referred to me for evaluation of palpitations, dyspnea and chest discomfort. She recently was referred for an exercise stress echocardiogram to evaluate symptoms in the setting of left bundle branch block. According to the patient she's had a left bundle branch block for at least 5 years and has had 2 stress tests in the past, both of which were nuclear stress tests. The first study caused her significant nausea vomiting and diarrhea and she was ill for more than 24 hours. The second study apparently caused her last symptoms but did persist for several hours and was a "new stress agent". She was then referred for an exercise echocardiogram however given her left bundle branch block, a stress portion of the study is not interpretable. That test was not performed and she was then referred to me for evaluation. Symptomatically she reports shortness of breath and occasional palpitations but denies any cardiac chest pain. EKG doesn't fact show left bundle branch block which is stable compared to prior EKGs in the past. She has some cardiac risk factors including hypertension and dyslipidemia but is well controlled.  07/30/2016  Regina Ramsey returns today for follow-up. She underwent an echocardiogram which showed preserved LVEF 65-70% with mild diastolic dysfunction. She reports her shortness of breath is improved somewhat. She noted when going out to [place] state where she was on vacation for a week, that her breathing was significantly better, which she attributed to cooler air temperatures and less allergens in the air. I did not see a cardiac cause of her dyspnea despite her left bundle branch block. I'm also reassured by her negative stress testing in the past. My suspicion is that her shortness of breath is related to  COPD.  04/28/2018  Regina Ramsey returns today for follow-up.  I last saw her in 2017.  At that time we did a work-up for new left bundle branch block.  Her nuclear stress test was negative for ischemia and echo showed normal LV function.  She had a pre-existing diagnosis of COPD, but has not been on treatment for that.  Subsequently she says that her COPD "got better", or that she had pulmonary function testing which apparently was normal.  She does have a remote smoking history.  Recently she has had more progressive dyspnea on exertion over the past several weeks.  She also reports some orthopnea, mild weight gain but no significant edema.  She denies any chest pain.  She has been bothered by worsening seasonal allergies, but doubles up her dose of fexofenadine with improvement in her symptoms.  She denies mucus or productive cough.  Or wheezing.  05/27/2018  Regina Ramsey returns for follow-up of her echo.  Fortunately this shows normal systolic function although she does have incoordinate septal motion due to her LBBB.  She has persistent shortness of breath, even with minimal exertion.  She ambulated today which demonstrated oxygen saturation that decreased to 92% but not lower.  She said this is consistent with findings at home.  I had recommended next for her to undergo pulmonary function testing.  If this is abnormal then I would recommend referral to pulmonary.  If PFTs are normal, then we should may be consider additional coronary assessment, perhaps even a Myoview stress test.  She did have a stress echo in 2017 however the  results were somewhat inconclusive.  10/01/2018  Regina Ramsey is seen today in follow-up.  She had abnormal PFTs and was referred to pulmonary.  Unfortunately it seems that some of the data was improperly entered into the test and a repeat pulmonary function testing was normal.  She was noted to have some bronchiectasis on the CT.  It was also thought that she has significant  reflux.  She has been on treatment with marked improvement in her reflux symptoms and no further diarrhea which she struggled with, but still has shortness of breath.  In the past, remotely she had stress testing with a Lexiscan however, she reported significant intolerance to the medication and is not interested in repeating that.  Recently the same CT scan of the chest demonstrated multivessel coronary artery calcification.  This suggests the possibility of underlying obstructive coronary disease.  As she still reports shortness of breath with exertion, I would recommend a CT coronary angiogram to further evaluate this.  11/21/2018  Regina Ramsey is seen today in follow-up of her CT angiogram with FFR.  The study demonstrated heavily calcified proximal LAD disease with an FFR of 0.96 however the distal LAD FFR was 0.8.  There is disease in both the circumflex and RCA arteries which were nonsignificant by FFR.  Nevertheless, given significant blooming artifact, further evaluation by cardiac catheterization was recommended.  Continues to have some persistent shortness of breath with exertion.  Blood pressure is elevated today.  She also tells me she gets leg pain when walking.  Specifically as a crampy-like pain in her legs which gets better when she rests.  She is also had some nonhealing wounds on her feet.  PMHx:  Past Medical History:  Diagnosis Date  . ALLERGIC RHINITIS   . ANEMIA-NOS   . Anxiety   . Asthma    Triggered by allergies  . COPD    normal PFTs 06/22/11  . Diverticulosis of colon   . Dyspareunia   . Endometriosis   . GERD   . HEARING LOSS 07/13/2010  . HYPERLIPIDEMIA   . HYPERTENSION   . Irritable bowel syndrome   . MIGRAINE HEADACHE   . OSTEOPOROSIS    on Prolia q 57mo hx vertebral fx  . PALPITATIONS, HX OF   . Vaginal atrophy     Past Surgical History:  Procedure Laterality Date  . ABDOMINAL HYSTERECTOMY  1974  . APPENDECTOMY  1958  . BREAST SURGERY  1985   breast  biopsy    FAMHx:  Family History  Adopted: Yes  Problem Relation Age of Onset  . Arthritis Mother   . Breast cancer Mother   . Cancer Mother        started on her spine met cancer  . Bone cancer Mother   . Arthritis Father   . Breast cancer Maternal Aunt   . Diabetes Other        parent & grandparent  . Hyperlipidemia Maternal Grandmother   . Bradycardia Son   . Heart Problems Daughter        enlarged heart  . Cancer - Prostate Brother   . Colon cancer Neg Hx   . Colon polyps Neg Hx     SOCHx:   reports that she quit smoking about 54 years ago. Her smoking use included cigarettes. She has never used smokeless tobacco. She reports that she drinks alcohol. She reports that she does not use drugs.  ALLERGIES:  No Known Allergies  ROS: Pertinent items noted in  HPI and remainder of comprehensive ROS otherwise negative.  HOME MEDS: Current Outpatient Medications  Medication Sig Dispense Refill  . amLODipine (NORVASC) 5 MG tablet Take 1 tablet (5 mg total) by mouth daily. 30 tablet 3  . Aspirin-Acetaminophen-Caffeine (GOODYS EXTRA STRENGTH PO) Take by mouth.    Marland Kitchen atorvastatin (LIPITOR) 20 MG tablet Take 1 tablet (20 mg total) by mouth daily. 90 tablet 1  . Loperamide HCl (IMODIUM PO) Take 1 tablet by mouth as needed (pt takes 2 times per week).     . metoprolol succinate (TOPROL-XL) 25 MG 24 hr tablet Take 1 tablet (25 mg total) by mouth daily. 90 tablet 3  . omeprazole (PRILOSEC) 20 MG capsule Take 20 mg by mouth daily.     No current facility-administered medications for this visit.     LABS/IMAGING: No results found for this or any previous visit (from the past 48 hour(s)). No results found.  WEIGHTS: Wt Readings from Last 3 Encounters:  11/21/18 129 lb 3.2 oz (58.6 kg)  11/12/18 133 lb (60.3 kg)  10/16/18 132 lb (59.9 kg)    VITALS: BP (!) 176/76   Pulse 78   Ht 4' 10"  (1.473 m)   Wt 129 lb 3.2 oz (58.6 kg)   BMI 27.00 kg/m    EXAM: Deferred  EKG: Deferred  ASSESSMENT: 1. Abnormal cardiac CT with borderline abnormal FFR 2. Progressive Dyspnea - LVEF 55-60% (2019), elevated LV filling pressure 3. LBBB 4. Palpitations 5. Hypertension-controlled 6. Dyslipidemia 7. Normal PFT's (2019) - bronchiectasis  PLAN: 1.   Regina Ramsey has somewhat equivocal findings on her CT FFR but definite multivessel coronary disease with particularly significant proximal LAD disease.  Given blooming artifact it is difficult to say whether or not the artery is significantly stenosed although FFR was negative, but she still has shortness of breath symptoms.  I would favor definitive cardiac catheterization for further evaluation of her symptoms.  I discussed the risk, benefits and alternatives with her and she is agreeable to proceed with cardiac catheterization.  We will try to schedule that in a couple weeks and she is planned to go to the beach with her family.  She is not complaining of significant symptoms of unstable angina at this point therefore I do not believe that the catheterization is urgent.  Only, she is complaining of symptoms that might be claudication.  We will go ahead and get lower extremity arterial Dopplers.  Follow-up with me afterwards.  Pixie Casino, MD, [facility], [place] Director of the Advanced Lipid Disorders &  Cardiovascular Risk Reduction Clinic Diplomate of the American Board of Clinical Lipidology Attending Cardiologist   Direct Dial: [phone]  Fax: [phone]  Website:  [email or web address] 11/21/2018, 3:00 PM

## 2018-11-21 NOTE — Progress Notes (Signed)
OFFICE NOTE  Chief Complaint:  Follow-up CT angiogram  Primary Care Physician: Binnie Rail, MD  HPI:  Regina Ramsey is a 77 y.o. female who is kindly referred to me for evaluation of palpitations, dyspnea and chest discomfort. She recently was referred for an exercise stress echocardiogram to evaluate symptoms in the setting of left bundle branch block. According to the patient she's had a left bundle branch block for at least 5 years and has had 2 stress tests in the past, both of which were nuclear stress tests. The first study caused her significant nausea vomiting and diarrhea and she was ill for more than 24 hours. The second study apparently caused her last symptoms but did persist for several hours and was a "new stress agent". She was then referred for an exercise echocardiogram however given her left bundle branch block, a stress portion of the study is not interpretable. That test was not performed and she was then referred to me for evaluation. Symptomatically she reports shortness of breath and occasional palpitations but denies any cardiac chest pain. EKG doesn't fact show left bundle branch block which is stable compared to prior EKGs in the past. She has some cardiac risk factors including hypertension and dyslipidemia but is well controlled.  07/30/2016  Regina Ramsey returns today for follow-up. She underwent an echocardiogram which showed preserved LVEF 65-70% with mild diastolic dysfunction. She reports her shortness of breath is improved somewhat. She noted when going out to [place] state where she was on vacation for a week, that her breathing was significantly better, which she attributed to cooler air temperatures and less allergens in the air. I did not see a cardiac cause of her dyspnea despite her left bundle branch block. I'm also reassured by her negative stress testing in the past. My suspicion is that her shortness of breath is related to  COPD.  04/28/2018  Regina Ramsey returns today for follow-up.  I last saw her in 2017.  At that time we did a work-up for new left bundle branch block.  Her nuclear stress test was negative for ischemia and echo showed normal LV function.  She had a pre-existing diagnosis of COPD, but has not been on treatment for that.  Subsequently she says that her COPD "got better", or that she had pulmonary function testing which apparently was normal.  She does have a remote smoking history.  Recently she has had more progressive dyspnea on exertion over the past several weeks.  She also reports some orthopnea, mild weight gain but no significant edema.  She denies any chest pain.  She has been bothered by worsening seasonal allergies, but doubles up her dose of fexofenadine with improvement in her symptoms.  She denies mucus or productive cough.  Or wheezing.  05/27/2018  Regina Ramsey returns for follow-up of her echo.  Fortunately this shows normal systolic function although she does have incoordinate septal motion due to her LBBB.  She has persistent shortness of breath, even with minimal exertion.  She ambulated today which demonstrated oxygen saturation that decreased to 92% but not lower.  She said this is consistent with findings at home.  I had recommended next for her to undergo pulmonary function testing.  If this is abnormal then I would recommend referral to pulmonary.  If PFTs are normal, then we should may be consider additional coronary assessment, perhaps even a Myoview stress test.  She did have a stress echo in 2017 however the  results were somewhat inconclusive.  10/01/2018  Regina Ramsey is seen today in follow-up.  She had abnormal PFTs and was referred to pulmonary.  Unfortunately it seems that some of the data was improperly entered into the test and a repeat pulmonary function testing was normal.  She was noted to have some bronchiectasis on the CT.  It was also thought that she has significant  reflux.  She has been on treatment with marked improvement in her reflux symptoms and no further diarrhea which she struggled with, but still has shortness of breath.  In the past, remotely she had stress testing with a Lexiscan however, she reported significant intolerance to the medication and is not interested in repeating that.  Recently the same CT scan of the chest demonstrated multivessel coronary artery calcification.  This suggests the possibility of underlying obstructive coronary disease.  As she still reports shortness of breath with exertion, I would recommend a CT coronary angiogram to further evaluate this.  11/21/2018  Regina Ramsey is seen today in follow-up of her CT angiogram with FFR.  The study demonstrated heavily calcified proximal LAD disease with an FFR of 0.96 however the distal LAD FFR was 0.8.  There is disease in both the circumflex and RCA arteries which were nonsignificant by FFR.  Nevertheless, given significant blooming artifact, further evaluation by cardiac catheterization was recommended.  Continues to have some persistent shortness of breath with exertion.  Blood pressure is elevated today.  She also tells me she gets leg pain when walking.  Specifically as a crampy-like pain in her legs which gets better when she rests.  She is also had some nonhealing wounds on her feet.  PMHx:  Past Medical History:  Diagnosis Date  . ALLERGIC RHINITIS   . ANEMIA-NOS   . Anxiety   . Asthma    Triggered by allergies  . COPD    normal PFTs 06/22/11  . Diverticulosis of colon   . Dyspareunia   . Endometriosis   . GERD   . HEARING LOSS 07/13/2010  . HYPERLIPIDEMIA   . HYPERTENSION   . Irritable bowel syndrome   . MIGRAINE HEADACHE   . OSTEOPOROSIS    on Prolia q 13mo hx vertebral fx  . PALPITATIONS, HX OF   . Vaginal atrophy     Past Surgical History:  Procedure Laterality Date  . ABDOMINAL HYSTERECTOMY  1974  . APPENDECTOMY  1958  . BREAST SURGERY  1985   breast  biopsy    FAMHx:  Family History  Adopted: Yes  Problem Relation Age of Onset  . Arthritis Mother   . Breast cancer Mother   . Cancer Mother        started on her spine met cancer  . Bone cancer Mother   . Arthritis Father   . Breast cancer Maternal Aunt   . Diabetes Other        parent & grandparent  . Hyperlipidemia Maternal Grandmother   . Bradycardia Son   . Heart Problems Daughter        enlarged heart  . Cancer - Prostate Brother   . Colon cancer Neg Hx   . Colon polyps Neg Hx     SOCHx:   reports that she quit smoking about 54 years ago. Her smoking use included cigarettes. She has never used smokeless tobacco. She reports that she drinks alcohol. She reports that she does not use drugs.  ALLERGIES:  No Known Allergies  ROS: Pertinent items noted in  HPI and remainder of comprehensive ROS otherwise negative.  HOME MEDS: Current Outpatient Medications  Medication Sig Dispense Refill  . amLODipine (NORVASC) 5 MG tablet Take 1 tablet (5 mg total) by mouth daily. 30 tablet 3  . Aspirin-Acetaminophen-Caffeine (GOODYS EXTRA STRENGTH PO) Take by mouth.    Marland Kitchen atorvastatin (LIPITOR) 20 MG tablet Take 1 tablet (20 mg total) by mouth daily. 90 tablet 1  . Loperamide HCl (IMODIUM PO) Take 1 tablet by mouth as needed (pt takes 2 times per week).     . metoprolol succinate (TOPROL-XL) 25 MG 24 hr tablet Take 1 tablet (25 mg total) by mouth daily. 90 tablet 3  . omeprazole (PRILOSEC) 20 MG capsule Take 20 mg by mouth daily.     No current facility-administered medications for this visit.     LABS/IMAGING: No results found for this or any previous visit (from the past 48 hour(s)). No results found.  WEIGHTS: Wt Readings from Last 3 Encounters:  11/21/18 129 lb 3.2 oz (58.6 kg)  11/12/18 133 lb (60.3 kg)  10/16/18 132 lb (59.9 kg)    VITALS: BP (!) 176/76   Pulse 78   Ht 4' 10"  (1.473 m)   Wt 129 lb 3.2 oz (58.6 kg)   BMI 27.00 kg/m    EXAM: Deferred  EKG: Deferred  ASSESSMENT: 1. Abnormal cardiac CT with borderline abnormal FFR 2. Progressive Dyspnea - LVEF 55-60% (2019), elevated LV filling pressure 3. LBBB 4. Palpitations 5. Hypertension-controlled 6. Dyslipidemia 7. Normal PFT's (2019) - bronchiectasis  PLAN: 1.   Mrs. Dineen has somewhat equivocal findings on her CT FFR but definite multivessel coronary disease with particularly significant proximal LAD disease.  Given blooming artifact it is difficult to say whether or not the artery is significantly stenosed although FFR was negative, but she still has shortness of breath symptoms.  I would favor definitive cardiac catheterization for further evaluation of her symptoms.  I discussed the risk, benefits and alternatives with her and she is agreeable to proceed with cardiac catheterization.  We will try to schedule that in a couple weeks and she is planned to go to the beach with her family.  She is not complaining of significant symptoms of unstable angina at this point therefore I do not believe that the catheterization is urgent.  Only, she is complaining of symptoms that might be claudication.  We will go ahead and get lower extremity arterial Dopplers.  Follow-up with me afterwards.  Pixie Casino, MD, [facility], [place] Director of the Advanced Lipid Disorders &  Cardiovascular Risk Reduction Clinic Diplomate of the American Board of Clinical Lipidology Attending Cardiologist   Direct Dial: [phone]  Fax: [phone]  Website:  [email or web address] 11/21/2018, 3:00 PM

## 2018-11-22 LAB — CBC
HEMOGLOBIN: 13.3 g/dL (ref 11.1–15.9)
Hematocrit: 40.5 % (ref 34.0–46.6)
MCH: 31.3 pg (ref 26.6–33.0)
MCHC: 32.8 g/dL (ref 31.5–35.7)
MCV: 95 fL (ref 79–97)
Platelets: 263 10*3/uL (ref 150–450)
RBC: 4.25 x10E6/uL (ref 3.77–5.28)
RDW: 11.9 % — ABNORMAL LOW (ref 12.3–15.4)
WBC: 6 10*3/uL (ref 3.4–10.8)

## 2018-11-22 LAB — BASIC METABOLIC PANEL
BUN/Creatinine Ratio: 24 (ref 12–28)
BUN: 19 mg/dL (ref 8–27)
CALCIUM: 9.4 mg/dL (ref 8.7–10.3)
CHLORIDE: 104 mmol/L (ref 96–106)
CO2: 23 mmol/L (ref 20–29)
Creatinine, Ser: 0.79 mg/dL (ref 0.57–1.00)
GFR calc Af Amer: 84 mL/min/{1.73_m2} (ref >59)
GFR, EST NON AFRICAN AMERICAN: 72 mL/min/{1.73_m2} (ref >59)
GLUCOSE: 98 mg/dL (ref 65–99)
POTASSIUM: 4.1 mmol/L (ref 3.5–5.2)
SODIUM: 143 mmol/L (ref 134–144)

## 2018-11-24 ENCOUNTER — Other Ambulatory Visit: Payer: Self-pay | Admitting: *Deleted

## 2018-11-24 DIAGNOSIS — R0602 Shortness of breath: Secondary | ICD-10-CM

## 2018-11-24 DIAGNOSIS — I2510 Atherosclerotic heart disease of native coronary artery without angina pectoris: Secondary | ICD-10-CM

## 2018-11-24 DIAGNOSIS — I447 Left bundle-branch block, unspecified: Secondary | ICD-10-CM

## 2018-11-25 ENCOUNTER — Other Ambulatory Visit: Payer: Self-pay | Admitting: Internal Medicine

## 2018-11-25 DIAGNOSIS — I739 Peripheral vascular disease, unspecified: Secondary | ICD-10-CM

## 2018-12-01 ENCOUNTER — Telehealth: Payer: Self-pay | Admitting: *Deleted

## 2018-12-01 NOTE — Telephone Encounter (Signed)
Pt contacted pre-catheterization scheduled at [facility] for: Wednesday December 03, 2018 8:30 AM Verified arrival time and place: [facility] Main Entrance A at:6:30 AM  No solid food after midnight prior to cath, clear liquids until 5 AM day of procedure. Contrast allergy: no Verified no diabetes medications.  AM meds can be  taken pre-cath with sip of water including: ASA 81 mg  Confirmed patient has responsible person to drive home post procedure and for 24 hours after you arrive home: yes

## 2018-12-03 ENCOUNTER — Other Ambulatory Visit: Payer: Self-pay

## 2018-12-03 ENCOUNTER — Encounter (HOSPITAL_COMMUNITY)
Admission: RE | Disposition: A | Discharge status: Home / Self Care | Payer: Self-pay | Source: Ambulatory Visit | Attending: Cardiovascular Disease

## 2018-12-03 ENCOUNTER — Ambulatory Visit (HOSPITAL_COMMUNITY)
Admission: RE | Admit: 2018-12-03 | Discharge: 2018-12-03 | Disposition: A | Discharge status: Home / Self Care | Payer: Medicare Other | Source: Ambulatory Visit | Attending: Cardiovascular Disease | Admitting: Cardiovascular Disease

## 2018-12-03 ENCOUNTER — Encounter (HOSPITAL_COMMUNITY): Payer: Self-pay | Admitting: Cardiovascular Disease

## 2018-12-03 DIAGNOSIS — G43909 Migraine, unspecified, not intractable, without status migrainosus: Secondary | ICD-10-CM | POA: Insufficient documentation

## 2018-12-03 DIAGNOSIS — Z8261 Family history of arthritis: Secondary | ICD-10-CM | POA: Diagnosis not present

## 2018-12-03 DIAGNOSIS — R0602 Shortness of breath: Secondary | ICD-10-CM

## 2018-12-03 DIAGNOSIS — Z79899 Other long term (current) drug therapy: Secondary | ICD-10-CM | POA: Insufficient documentation

## 2018-12-03 DIAGNOSIS — Z8249 Family history of ischemic heart disease and other diseases of the circulatory system: Secondary | ICD-10-CM | POA: Diagnosis not present

## 2018-12-03 DIAGNOSIS — R002 Palpitations: Secondary | ICD-10-CM | POA: Insufficient documentation

## 2018-12-03 DIAGNOSIS — Z833 Family history of diabetes mellitus: Secondary | ICD-10-CM | POA: Diagnosis not present

## 2018-12-03 DIAGNOSIS — Z90710 Acquired absence of both cervix and uterus: Secondary | ICD-10-CM | POA: Insufficient documentation

## 2018-12-03 DIAGNOSIS — H9190 Unspecified hearing loss, unspecified ear: Secondary | ICD-10-CM | POA: Diagnosis not present

## 2018-12-03 DIAGNOSIS — I447 Left bundle-branch block, unspecified: Secondary | ICD-10-CM | POA: Diagnosis not present

## 2018-12-03 DIAGNOSIS — I10 Essential (primary) hypertension: Secondary | ICD-10-CM | POA: Diagnosis not present

## 2018-12-03 DIAGNOSIS — I25119 Atherosclerotic heart disease of native coronary artery with unspecified angina pectoris: Secondary | ICD-10-CM | POA: Insufficient documentation

## 2018-12-03 DIAGNOSIS — Z7982 Long term (current) use of aspirin: Secondary | ICD-10-CM | POA: Diagnosis not present

## 2018-12-03 DIAGNOSIS — Z9889 Other specified postprocedural states: Secondary | ICD-10-CM | POA: Diagnosis not present

## 2018-12-03 DIAGNOSIS — K219 Gastro-esophageal reflux disease without esophagitis: Secondary | ICD-10-CM | POA: Insufficient documentation

## 2018-12-03 DIAGNOSIS — I2510 Atherosclerotic heart disease of native coronary artery without angina pectoris: Secondary | ICD-10-CM

## 2018-12-03 DIAGNOSIS — E785 Hyperlipidemia, unspecified: Secondary | ICD-10-CM | POA: Insufficient documentation

## 2018-12-03 DIAGNOSIS — M1990 Unspecified osteoarthritis, unspecified site: Secondary | ICD-10-CM | POA: Insufficient documentation

## 2018-12-03 DIAGNOSIS — Z87891 Personal history of nicotine dependence: Secondary | ICD-10-CM | POA: Diagnosis not present

## 2018-12-03 DIAGNOSIS — J449 Chronic obstructive pulmonary disease, unspecified: Secondary | ICD-10-CM | POA: Diagnosis not present

## 2018-12-03 DIAGNOSIS — R0600 Dyspnea, unspecified: Secondary | ICD-10-CM | POA: Insufficient documentation

## 2018-12-03 DIAGNOSIS — K589 Irritable bowel syndrome without diarrhea: Secondary | ICD-10-CM | POA: Insufficient documentation

## 2018-12-03 HISTORY — PX: LEFT HEART CATH AND CORONARY ANGIOGRAPHY: CATH118249

## 2018-12-03 SURGERY — LEFT HEART CATH AND CORONARY ANGIOGRAPHY
Anesthesia: LOCAL

## 2018-12-03 MED ORDER — SODIUM CHLORIDE 0.9% FLUSH: 3.0000 mL | Freq: Two times a day (BID) | INTRAVENOUS | Status: DC

## 2018-12-03 MED ORDER — FENTANYL CITRATE (PF) 100 MCG/2ML IJ SOLN
INTRAMUSCULAR | Status: AC
  Filled 2018-12-03: qty 2

## 2018-12-03 MED ORDER — ATORVASTATIN CALCIUM 40 MG PO TABS: 40.0000 mg | ORAL_TABLET | Freq: Every day | ORAL | Status: DC

## 2018-12-03 MED ORDER — FENTANYL CITRATE (PF) 100 MCG/2ML IJ SOLN
INTRAMUSCULAR | Status: DC | PRN
  Administered 2018-12-03: 25 ug via INTRAVENOUS

## 2018-12-03 MED ORDER — ASPIRIN 81 MG PO CHEW: 81.0000 mg | CHEWABLE_TABLET | Freq: Every day | ORAL | Status: DC

## 2018-12-03 MED ORDER — SODIUM CHLORIDE 0.9% FLUSH: 3.0000 mL | INTRAVENOUS | Status: DC | PRN

## 2018-12-03 MED ORDER — LIDOCAINE HCL (PF) 1 % IJ SOLN
INTRAMUSCULAR | Status: DC | PRN
  Administered 2018-12-03: 2 mL

## 2018-12-03 MED ORDER — SODIUM CHLORIDE 0.9 % IV SOLN: 250.0000 mL | INTRAVENOUS | Status: DC | PRN

## 2018-12-03 MED ORDER — IOHEXOL 350 MG/ML SOLN
INTRAVENOUS | Status: DC | PRN
  Administered 2018-12-03: 50 mL via INTRA_ARTERIAL

## 2018-12-03 MED ORDER — MIDAZOLAM HCL 2 MG/2ML IJ SOLN
INTRAMUSCULAR | Status: DC | PRN
  Administered 2018-12-03: 2 mg via INTRAVENOUS

## 2018-12-03 MED ORDER — ASPIRIN 81 MG PO CHEW: 81.0000 mg | CHEWABLE_TABLET | ORAL | Status: DC

## 2018-12-03 MED ORDER — HEPARIN SODIUM (PORCINE) 1000 UNIT/ML IJ SOLN
INTRAMUSCULAR | Status: DC | PRN
  Administered 2018-12-03: 3000 [IU] via INTRAVENOUS

## 2018-12-03 MED ORDER — HEPARIN (PORCINE) IN NACL 1000-0.9 UT/500ML-% IV SOLN
INTRAVENOUS | Status: DC | PRN
  Administered 2018-12-03 (×2): 500 mL

## 2018-12-03 MED ORDER — MIDAZOLAM HCL 2 MG/2ML IJ SOLN
INTRAMUSCULAR | Status: AC
  Filled 2018-12-03: qty 2

## 2018-12-03 MED ORDER — SODIUM CHLORIDE 0.9 % IV SOLN
INTRAVENOUS | Status: DC
  Administered 2018-12-03: 07:00:00 via INTRAVENOUS

## 2018-12-03 MED ORDER — LIDOCAINE HCL (PF) 1 % IJ SOLN
INTRAMUSCULAR | Status: AC
  Filled 2018-12-03: qty 30

## 2018-12-03 MED ORDER — VERAPAMIL HCL 2.5 MG/ML IV SOLN
INTRAVENOUS | Status: AC
  Filled 2018-12-03: qty 2

## 2018-12-03 MED ORDER — HEPARIN (PORCINE) IN NACL 1000-0.9 UT/500ML-% IV SOLN
INTRAVENOUS | Status: AC
  Filled 2018-12-03: qty 1000

## 2018-12-03 MED ORDER — SODIUM CHLORIDE 0.9 % IV SOLN: INTRAVENOUS | Status: DC

## 2018-12-03 MED ORDER — ACETAMINOPHEN 325 MG PO TABS: 650.0000 mg | ORAL_TABLET | ORAL | Status: DC | PRN

## 2018-12-03 MED ORDER — VERAPAMIL HCL 2.5 MG/ML IV SOLN
INTRAVENOUS | Status: DC | PRN
  Administered 2018-12-03: 10 mL via INTRA_ARTERIAL

## 2018-12-03 MED ORDER — ONDANSETRON HCL 4 MG/2ML IJ SOLN: 4.0000 mg | Freq: Four times a day (QID) | INTRAMUSCULAR | Status: DC | PRN

## 2018-12-03 SURGICAL SUPPLY — 13 items
CATH INFINITI 5FR ANG PIGTAIL (CATHETERS) ×1 IMPLANT
CATH OPTITORQUE TIG 4.0 5F (CATHETERS) ×1 IMPLANT
DEVICE RAD COMP TR BAND LRG (VASCULAR PRODUCTS) ×1 IMPLANT
GLIDESHEATH SLEND SS 6F .021 (SHEATH) ×1 IMPLANT
GUIDEWIRE INQWIRE 1.5J.035X260 (WIRE) IMPLANT
INQWIRE 1.5J .035X260CM (WIRE) ×2
KIT HEART LEFT (KITS) ×2 IMPLANT
PACK CARDIAC CATHETERIZATION (CUSTOM PROCEDURE TRAY) ×2 IMPLANT
SHEATH PROBE COVER 6X72 (BAG) ×1 IMPLANT
SYR MEDRAD MARK 7 150ML (SYRINGE) ×1 IMPLANT
TRANSDUCER W/STOPCOCK (MISCELLANEOUS) ×2 IMPLANT
TUBING CIL FLEX 10 FLL-RA (TUBING) ×2 IMPLANT
WIRE HI TORQ VERSACORE-J 145CM (WIRE) ×1 IMPLANT

## 2018-12-03 NOTE — Interval H&P Note (Signed)
Cath Lab Visit (complete for each Cath Lab visit)  Clinical Evaluation Leading to the Procedure:   ACS: No.  Non-ACS:    Anginal Classification: CCS II  Anti-ischemic medical therapy: Maximal Therapy (2 or more classes of medications)  Non-Invasive Test Results: Intermediate-risk stress test findings: cardiac mortality 1-3%/year  Prior CABG: No previous CABG      History and Physical Interval Note:  12/03/2018 8:20 AM  Regina Ramsey  has presented today for surgery, with the diagnosis of abn cardiac ct .  The various methods of treatment have been discussed with the patient and family. After consideration of risks, benefits and other options for treatment, the patient has consented to  Procedure(s): LEFT HEART CATH AND CORONARY ANGIOGRAPHY (N/A) as a surgical intervention .  The patient's history has been reviewed, patient examined, no change in status, stable for surgery.  I have reviewed the patient's chart and labs.  Questions were answered to the patient's satisfaction.     Shelva Majestic

## 2018-12-03 NOTE — Discharge Instructions (Signed)
Radial Site Care °Refer to this sheet in the next few weeks. These instructions provide you with information about caring for yourself after your procedure. Your health care provider may also give you more specific instructions. Your treatment has been planned according to current medical practices, but problems sometimes occur. Call your health care provider if you have any problems or questions after your procedure. °What can I expect after the procedure? °After your procedure, it is typical to have the following: °· Bruising at the radial site that usually fades within 1-2 weeks. °· Blood collecting in the tissue (hematoma) that may be painful to the touch. It should usually decrease in size and tenderness within 1-2 weeks. ° °Follow these instructions at home: °· Take medicines only as directed by your health care provider. °· You may shower 24-48 hours after the procedure or as directed by your health care provider. Remove the bandage (dressing) and gently wash the site with plain soap and water. Pat the area dry with a clean towel. Do not rub the site, because this may cause bleeding. °· Do not take baths, swim, or use a hot tub until your health care provider approves. °· Check your insertion site every day for redness, swelling, or drainage. °· Do not apply powder or lotion to the site. °· Do not flex or bend the affected arm for 24 hours or as directed by your health care provider. °· Do not push or pull heavy objects with the affected arm for 24 hours or as directed by your health care provider. °· Do not lift over 10 lb (4.5 kg) for 5 days after your procedure or as directed by your health care provider. °· Ask your health care provider when it is okay to: °? Return to work or school. °? Resume usual physical activities or sports. °? Resume sexual activity. °· Do not drive home if you are discharged the same day as the procedure. Have someone else drive you. °· You may drive 24 hours after the procedure  unless otherwise instructed by your health care provider. °· Do not operate machinery or power tools for 24 hours after the procedure. °· If your procedure was done as an outpatient procedure, which means that you went home the same day as your procedure, a responsible adult should be with you for the first 24 hours after you arrive home. °· Keep all follow-up visits as directed by your health care provider. This is important. °Contact a health care provider if: °· You have a fever. °· You have chills. °· You have increased bleeding from the radial site. Hold pressure on the site. °Get help right away if: °· You have unusual pain at the radial site. °· You have redness, warmth, or swelling at the radial site. °· You have drainage (other than a small amount of blood on the dressing) from the radial site. °· The radial site is bleeding, and the bleeding does not stop after 30 minutes of holding steady pressure on the site. °· Your arm or hand becomes pale, cool, tingly, or numb. °This information is not intended to replace advice given to you by your health care provider. Make sure you discuss any questions you have with your health care provider. °Document Released: 01/19/2011 Document Revised: 05/24/2016 Document Reviewed: 07/05/2014 °Elsevier Interactive Patient Education © 2018 [employer] ° ° ° °Moderate Conscious Sedation, Adult, Care After °These instructions provide you with information about caring for yourself after your procedure. Your health care provider   may also give you more specific instructions. Your treatment has been planned according to current medical practices, but problems sometimes occur. Call your health care provider if you have any problems or questions after your procedure. °What can I expect after the procedure? °After your procedure, it is common: °· To feel sleepy for several hours. °· To feel clumsy and have poor balance for several hours. °· To have poor judgment for several  hours. °· To vomit if you eat too soon. ° °Follow these instructions at home: °For at least 24 hours after the procedure: ° °· Do not: °? Participate in activities where you could fall or become injured. °? Drive. °? Use heavy machinery. °? Drink alcohol. °? Take sleeping pills or medicines that cause drowsiness. °? Make important decisions or sign legal documents. °? Take care of children on your own. °· Rest. °Eating and drinking °· Follow the diet recommended by your health care provider. °· If you vomit: °? Drink water, juice, or soup when you can drink without vomiting. °? Make sure you have little or no nausea before eating solid foods. °General instructions °· Have a responsible adult stay with you until you are awake and alert. °· Take over-the-counter and prescription medicines only as told by your health care provider. °· If you smoke, do not smoke without supervision. °· Keep all follow-up visits as told by your health care provider. This is important. °Contact a health care provider if: °· You keep feeling nauseous or you keep vomiting. °· You feel light-headed. °· You develop a rash. °· You have a fever. °Get help right away if: °· You have trouble breathing. °This information is not intended to replace advice given to you by your health care provider. Make sure you discuss any questions you have with your health care provider. °Document Released: 10/07/2013 Document Revised: 05/21/2016 Document Reviewed: 04/07/2016 °Elsevier Interactive Patient Education © 2018 [employer] Inc[employer] ° °

## 2018-12-15 ENCOUNTER — Ambulatory Visit (HOSPITAL_COMMUNITY)
Admission: RE | Admit: 2018-12-15 | Discharge: 2018-12-15 | Disposition: A | Discharge status: Home / Self Care | Payer: Medicare Other | Source: Ambulatory Visit | Attending: Cardiology | Admitting: Cardiology

## 2018-12-15 DIAGNOSIS — I739 Peripheral vascular disease, unspecified: Secondary | ICD-10-CM

## 2018-12-22 ENCOUNTER — Other Ambulatory Visit: Payer: Self-pay | Admitting: Internal Medicine

## 2018-12-26 ENCOUNTER — Other Ambulatory Visit: Payer: Self-pay | Admitting: Internal Medicine

## 2019-01-02 ENCOUNTER — Ambulatory Visit: Payer: Medicare Other | Admitting: Physician Assistant

## 2019-01-02 ENCOUNTER — Encounter: Payer: Self-pay | Admitting: Physician Assistant

## 2019-01-02 VITALS — BP 187/87 | HR 72 | Ht <= 58 in | Wt 131.8 lb

## 2019-01-02 DIAGNOSIS — I10 Essential (primary) hypertension: Secondary | ICD-10-CM | POA: Diagnosis not present

## 2019-01-02 DIAGNOSIS — R002 Palpitations: Secondary | ICD-10-CM

## 2019-01-02 DIAGNOSIS — I2510 Atherosclerotic heart disease of native coronary artery without angina pectoris: Secondary | ICD-10-CM | POA: Diagnosis not present

## 2019-01-02 DIAGNOSIS — E782 Mixed hyperlipidemia: Secondary | ICD-10-CM

## 2019-01-02 MED ORDER — METOPROLOL SUCCINATE ER 50 MG PO TB24: 50.0000 mg | ORAL_TABLET | Freq: Every day | ORAL | 6 refills | Status: DC

## 2019-01-02 MED ORDER — LISINOPRIL 2.5 MG PO TABS: 2.5000 mg | ORAL_TABLET | Freq: Every day | ORAL | 6 refills | Status: DC

## 2019-01-02 NOTE — Progress Notes (Signed)
Cardiology Office Note    Date:  01/04/2019   ID:  Regina Ramsey 1941-09-29, MRN [id number]  PCP:  Binnie Rail, MD  Cardiologist: Dr. Debara Pickett  Chief Complaint  Patient presents with  . Follow-up    seen for Dr. Debara Pickett    History of Present Illness:  Regina Ramsey is a 78 y.o. female with past medical history of palpitation, hypertension, hyperlipidemia, and asthma.  She has a chronic left bundle branch block with normal stress test in the past.  Although she carries a previous diagnosis of COPD, however her last PFT was normal.  Last echocardiogram obtained on 05/05/2018 showed EF 55 to 60%, abnormal septal motion from left bundle branch block, mild LVH.  Patient was recently seen by Dr. Debara Pickett on 11/21/2018, her coronary CT with FFR demonstrated a heavily calcified proximal LAD disease with FFR of 0.96, distal LAD had FFR of 0.8.  There was disease in both left circumflex and RCA which was nonsignificant by FFR.  However given the significant blooming artifact, further evaluation by cardiac catheterization was recommended.  Cardiac catheterization was performed on 12/03/2018 which showed 20% mid LAD lesion, 15% proximal to mid LAD lesion, no disease was noted in dominant RCA and left circumflex, the mid LAD portion had very mild intramyocardial bridging, EF was greater than 65%.  Patient presents today for cardiology office visit.  She denies any obvious chest pain or shortness of breath.  She will need fasting lipid panel and a liver function test in the next few month, if LDL remains uncontrolled, will need to increase Lipitor to 40 mg daily.  She does not have any lower extremity edema, orthopnea or PND.  Her blood pressure has been high recently, it is also in the 170 and 180s at home as well.  I recommend to increase metoprolol to 50 mg daily and add lisinopril 2.5 mg nightly to her medical regimen.  She will need to follow-up with the hypertension clinic in 3 to 4 weeks for up  titration of lisinopril.  Otherwise she denies any headache, blurred vision of dizziness.  She can follow-up with Dr. Debara Pickett in 6 months.   Past Medical History:  Diagnosis Date  . ALLERGIC RHINITIS   . ANEMIA-NOS   . Anxiety   . Asthma    Triggered by allergies  . COPD    normal PFTs 06/22/11  . Diverticulosis of colon   . Dyspareunia   . Endometriosis   . GERD   . HEARING LOSS 07/13/2010  . HYPERLIPIDEMIA   . HYPERTENSION   . Irritable bowel syndrome   . MIGRAINE HEADACHE   . OSTEOPOROSIS    on Prolia q 78mo, hx vertebral fx  . PALPITATIONS, HX OF   . Vaginal atrophy     Past Surgical History:  Procedure Laterality Date  . ABDOMINAL HYSTERECTOMY  1974  . APPENDECTOMY  1958  . BREAST SURGERY  1985   breast biopsy  . LEFT HEART CATH AND CORONARY ANGIOGRAPHY N/A 12/03/2018   Procedure: LEFT HEART CATH AND CORONARY ANGIOGRAPHY;  Surgeon: Troy Sine, MD;  Location: [facility];  Service: Cardiovascular;  Laterality: N/A;    Current Medications: Outpatient Medications Prior to Visit  Medication Sig Dispense Refill  . acetaminophen (TYLENOL) 500 MG tablet Take 500 mg by mouth every 8 (eight) hours as needed (pain).    Marland Kitchen aspirin EC 81 MG tablet Take 81 mg by mouth daily.    Marland Kitchen atorvastatin (  LIPITOR) 20 MG tablet TAKE 1 TABLET(20 MG) BY MOUTH DAILY 90 tablet 1  . naproxen sodium (ALEVE) 220 MG tablet Take 440 mg by mouth 2 (two) times daily as needed (pain).    Marland Kitchen omeprazole (PRILOSEC) 20 MG capsule Take 20 mg by mouth daily.    . metoprolol succinate (TOPROL-XL) 25 MG 24 hr tablet Take 1 tablet (25 mg total) by mouth daily. 90 tablet 3   No facility-administered medications prior to visit.      Allergies:   Patient has no known allergies.   Social History   Socioeconomic History  . Marital status: Married    Spouse name: Not on file  . Number of children: 2  . Years of education: Not on file  . Highest education level: Not on file  Occupational History  .  Occupation: Retired  Scientific laboratory technician  . Financial resource strain: Not on file  . Food insecurity:    Worry: Not on file    Inability: Not on file  . Transportation needs:    Medical: Not on file    Non-medical: Not on file  Tobacco Use  . Smoking status: Former Smoker    Types: Cigarettes    Last attempt to quit: 01/01/1964    Years since quitting: 55.0  . Smokeless tobacco: Never Used  . Tobacco comment: Married, lives with souse. Master of education-specialist needs for grade school  Substance and Sexual Activity  . Alcohol use: Yes    Alcohol/week: 0.0 standard drinks    Comment: Social  . Drug use: No  . Sexual activity: Not on file  Lifestyle  . Physical activity:    Days per week: Not on file    Minutes per session: Not on file  . Stress: Not on file  Relationships  . Social connections:    Talks on phone: Not on file    Gets together: Not on file    Attends religious service: Not on file    Active member of club or organization: Not on file    Attends meetings of clubs or organizations: Not on file    Relationship status: Not on file  Other Topics Concern  . Not on file  Social History Narrative   Retired Pharmacist, hospital      No regular exercise, active      epworth sleepiness scale = 10 (05/30/16)     Family History:  The patient's family history includes Arthritis in her father and mother; Bone cancer in her mother; Bradycardia in her son; Breast cancer in her maternal aunt and mother; Cancer in her mother; Cancer - Prostate in her brother; Diabetes in an other family member; Heart Problems in her daughter; Hyperlipidemia in her maternal grandmother. She was adopted.   ROS:   Please see the history of present illness.    ROS All other systems reviewed and are negative.   PHYSICAL EXAM:   VS:  BP (!) 187/87   Pulse 72   Ht 4\' 10"  (1.473 m)   Wt 131 lb 12.8 oz (59.8 kg)   BMI 27.55 kg/m    GEN: Well nourished, well developed, in no acute distress  HEENT: normal   Neck: no JVD, carotid bruits, or masses Cardiac: RRR; no murmurs, rubs, or gallops,no edema  Respiratory:  clear to auscultation bilaterally, normal work of breathing GI: soft, nontender, nondistended, + BS MS: no deformity or atrophy  Skin: warm and dry, no rash Neuro:  Alert and Oriented x 3, Strength and  sensation are intact Psych: euthymic mood, full affect  Wt Readings from Last 3 Encounters:  01/02/19 131 lb 12.8 oz (59.8 kg)  11/21/18 129 lb 3.2 oz (58.6 kg)  11/12/18 133 lb (60.3 kg)      Studies/Labs Reviewed:   EKG:  EKG is not ordered today.   Recent Labs: 05/08/2018: ALT 13; TSH 2.77 11/21/2018: BUN 19; Creatinine, Ser 0.79; Hemoglobin 13.3; Platelets 263; Potassium 4.1; Sodium 143   Lipid Panel    Component Value Date/Time   CHOL 177 05/08/2018 0831   TRIG 175.0 (H) 05/08/2018 0831   TRIG 117 08/03/2009   HDL 40.20 05/08/2018 0831   CHOLHDL 4 05/08/2018 0831   VLDL 35.0 05/08/2018 0831   LDLCALC 102 (H) 05/08/2018 0831   LDLDIRECT 193.0 07/13/2010 0000    Additional studies/ records that were reviewed today include:   Echo 05/05/2018 LV EF: 55% -   60% Study Conclusions  - Left ventricle: Abnormal septal motion from LBBB. The cavity size   was normal. Wall thickness was increased in a pattern of mild   LVH. Systolic function was normal. The estimated ejection   fraction was in the range of 55% to 60%. Wall motion was normal;   there were no regional wall motion abnormalities. Doppler   parameters are consistent with both elevated ventricular   end-diastolic filling pressure and elevated left atrial filling   pressure. - Atrial septum: No defect or patent foramen ovale was identified.   Cath 12/03/2018  Mid LAD lesion is 20% stenosed.  Prox LAD to Mid LAD lesion is 15% stenosed.  The left ventricular systolic function is normal.  LV end diastolic pressure is normal.   There is evidence for coronary calcification involving all coronary  arteries.  The dominant RCA and left circumflex vessels do not appear to have any intraluminal stenosis.  The LAD is calcification proximally.  The mid LAD dips intramyocardial and has probable luminal plaque of 10% with very mild systolic bridging.  There is focal 20% distal LAD stenosis.  Hyperdynamic LV function with evidence for concentric LVH, EF greater than 65%, and LVEDP at 13 mmHg.  RECOMMENDATION: Medical therapy.  Aggressive lipid-lowering therapy with target LDL less than 70.   ASSESSMENT:    1. CAD in native artery   2. Essential hypertension   3. Mixed hyperlipidemia   4. Palpitation      PLAN:  In order of problems listed above:  1. CAD: On aspirin and Lipitor.  Recent cardiac catheterization showed minimal disease  2. Hypertension: Blood pressure uncontrolled, add lisinopril 2.5 mg daily and increase Toprol-XL to 50 mg daily.  3. Hyperlipidemia: Continue Lipitor 20 mg daily.  Obtain fasting lipid panel and LFT  4. Palpitation: Well-controlled on the metoprolol.    Medication Adjustments/Labs and Tests Ordered: Current medicines are reviewed at length with the patient today.  Concerns regarding medicines are outlined above.  Medication changes, Labs and Tests ordered today are listed in the Patient Instructions below. Patient Instructions  Medication Instructions:  Increase Toprol XL to 50 mg daily Start Lisinopril 2.5 mg daily If you need a refill on your cardiac medications before your next appointment, please call your pharmacy.   Lab work: Fasting Lipid and Hepatic Panels   If you have labs (blood work) drawn today and your tests are completely normal, you will receive your results only by: Marland Kitchen MyChart Message (if you have MyChart) OR . A paper copy in the mail If you have any lab  test that is abnormal or we need to change your treatment, we will call you to review the results.  Testing/Procedures: None ordered  Follow-Up: At [facility], you  and your health needs are our priority.  As part of our continuing mission to provide you with exceptional heart care, we have created designated Provider Care Teams.  These Care Teams include your primary Cardiologist (physician) and Advanced Practice Providers (APPs -  Physician Assistants and Nurse Practitioners) who all work together to provide you with the care you need, when you need it.        Schedule appointment with Hypertension clinic in 3 to 4 weeks  Bring blood pressure cuff and list of blood pressure readings to appointment.  . Follow up appointment with Dr.Hilty in 6 months call 3 months before to schedule               Signed, Almyra Deforest, PA  01/04/2019 8:24 PM    [address], [place], [place]  [address] Phone: [phone]; Fax: [phone]

## 2019-01-02 NOTE — Patient Instructions (Signed)
Medication Instructions:  Increase Toprol XL to 50 mg daily Start Lisinopril 2.5 mg daily If you need a refill on your cardiac medications before your next appointment, please call your pharmacy.   Lab work: Fasting Lipid and Hepatic Panels   If you have labs (blood work) drawn today and your tests are completely normal, you will receive your results only by: Marland Kitchen MyChart Message (if you have MyChart) OR . A paper copy in the mail If you have any lab test that is abnormal or we need to change your treatment, we will call you to review the results.  Testing/Procedures: None ordered  Follow-Up: At [facility], you and your health needs are our priority.  As part of our continuing mission to provide you with exceptional heart care, we have created designated Provider Care Teams.  These Care Teams include your primary Cardiologist (physician) and Advanced Practice Providers (APPs -  Physician Assistants and Nurse Practitioners) who all work together to provide you with the care you need, when you need it.        Schedule appointment with Hypertension clinic in 3 to 4 weeks  Bring blood pressure cuff and list of blood pressure readings to appointment.  . Follow up appointment with Dr.Hilty in 6 months call 3 months before to schedule

## 2019-01-04 ENCOUNTER — Encounter: Payer: Self-pay | Admitting: Physician Assistant

## 2019-01-08 LAB — LIPID PANEL
Chol/HDL Ratio: 3.5 ratio (ref 0.0–4.4)
Cholesterol, Total: 145 mg/dL (ref 100–199)
HDL: 41 mg/dL (ref >39)
LDL Calculated: 83 mg/dL (ref 0–99)
Triglycerides: 104 mg/dL (ref 0–149)
VLDL Cholesterol Cal: 21 mg/dL (ref 5–40)

## 2019-01-08 LAB — HEPATIC FUNCTION PANEL
ALK PHOS: 62 IU/L (ref 39–117)
ALT: 21 IU/L (ref 0–32)
AST: 22 IU/L (ref 0–40)
Albumin: 4.2 g/dL (ref 3.5–4.8)
Bilirubin Total: 0.5 mg/dL (ref 0.0–1.2)
Bilirubin, Direct: 0.14 mg/dL (ref 0.00–0.40)
Total Protein: 6.4 g/dL (ref 6.0–8.5)

## 2019-01-09 ENCOUNTER — Other Ambulatory Visit: Payer: Self-pay

## 2019-01-09 DIAGNOSIS — E782 Mixed hyperlipidemia: Secondary | ICD-10-CM

## 2019-01-09 MED ORDER — ATORVASTATIN CALCIUM 40 MG PO TABS: 40.0000 mg | ORAL_TABLET | Freq: Every day | ORAL | 6 refills | Status: DC

## 2019-01-27 ENCOUNTER — Ambulatory Visit (INDEPENDENT_AMBULATORY_CARE_PROVIDER_SITE_OTHER): Payer: Medicare Other | Admitting: Pharmacist

## 2019-01-27 VITALS — BP 158/64 | HR 80 | Resp 16 | Ht <= 58 in

## 2019-01-27 DIAGNOSIS — I10 Essential (primary) hypertension: Secondary | ICD-10-CM | POA: Diagnosis not present

## 2019-01-27 MED ORDER — LISINOPRIL 2.5 MG PO TABS: 5.0000 mg | ORAL_TABLET | Freq: Every day | ORAL | 1 refills | Status: DC

## 2019-01-27 NOTE — Progress Notes (Signed)
Patient ID: [name]                 DOB: 08/04/1941                      MRN: [id number]     HPI: Regina Ramsey is a 78 y.o. female referred by Dr. Debara Pickett to HTN clinic. PMH includes palpitations, hypertension, hyperlipidemia, severe IBS-D, GERD, and chronic LBBB.  Patient will like to avoid any medication that may irritate her GI or worsen her IBS. She denies problems with lisinopril currently or in the past. Denies swelling, dizziness, shortness of breath, or blurry vision .  Current HTN meds:  Lisinopril 2.5mg  daily (PM) - started 01/02/2019 Metoprolol 50mg  daily (AM)   Previously tried:  Amlodipine 5mg  daily - substituted by metoprolol Lisinopril/HCTZ Losartan/HCTZ Telmisartan/HCTZ - diarrhea   BP goal: 130/80  Family History: The patient's family history includes Arthritis in her father and mother; Bone cancer in her mother; Bradycardia in her son; Breast cancer in her maternal aunt and mother; Cancer in her mother; Cancer - Prostate in her brother; Diabetes in an other family member; Heart Problems in her daughter; Hyperlipidemia in her maternal grandmother. She was adopted.   Social History: former smoker, alcohol social (once per month)  Diet: GERD diet, no carbonated drinks, eats out frequently (6 of 7 days dinner), skip breakfast  Exercise: no regular exercise   Home BP readings:  10 readings, average 146/80; HR 63-83bpm  Wt Readings from Last 3 Encounters:  01/02/19 131 lb 12.8 oz (59.8 kg)  11/21/18 129 lb 3.2 oz (58.6 kg)  11/12/18 133 lb (60.3 kg)   BP Readings from Last 3 Encounters:  01/27/19 (!) 158/64  01/02/19 (!) 187/87  12/03/18 129/63   Pulse Readings from Last 3 Encounters:  01/27/19 80  01/02/19 72  12/03/18 79    Past Medical History:  Diagnosis Date  . ALLERGIC RHINITIS   . ANEMIA-NOS   . Anxiety   . Asthma    Triggered by allergies  . COPD    normal PFTs 06/22/11  . Diverticulosis of colon   . Dyspareunia   . Endometriosis    . GERD   . HEARING LOSS 07/13/2010  . HYPERLIPIDEMIA   . HYPERTENSION   . Irritable bowel syndrome   . MIGRAINE HEADACHE   . OSTEOPOROSIS    on Prolia q 54mo, hx vertebral fx  . PALPITATIONS, HX OF   . Vaginal atrophy     Current Outpatient Medications on File Prior to Visit  Medication Sig Dispense Refill  . aspirin EC 81 MG tablet Take 81 mg by mouth daily.    Marland Kitchen atorvastatin (LIPITOR) 40 MG tablet Take 1 tablet (40 mg total) by mouth daily at 6 PM. 30 tablet 6  . calcium carbonate (TUMS - DOSED IN MG ELEMENTAL CALCIUM) 500 MG chewable tablet Chew 1 tablet by mouth daily.    . metoprolol succinate (TOPROL XL) 50 MG 24 hr tablet Take 1 tablet (50 mg total) by mouth daily. Take with or immediately following a meal. 30 tablet 6  . acetaminophen (TYLENOL) 500 MG tablet Take 500 mg by mouth every 8 (eight) hours as needed (pain).    Marland Kitchen omeprazole (PRILOSEC) 20 MG capsule Take 20 mg by mouth daily.     No current facility-administered medications on file prior to visit.     No Known Allergies  Blood pressure (!) 158/64, pulse 80, resp. rate 16, height  4\' 10"  (1.473 m).    Essential hypertension Blood pressure remains above goal, but significantly improved since last OV. Patient denies problems with current medication and continues to experience hip and foot pain. She stop chronic Naproxen and Tylenol use. We discussed few non-pharmacological alternatives for pain control until f/u with PCP and orthopedic provider. Will increase lisinopril to 5mg  daily, repeat BMET in 10-14 days and follow up with HTN clinic in 4 weeks.   Ingrid Shifrin Rodriguez-Guzman PharmD, BCPS, [place] [id number] 01/28/2019 5:17 PM

## 2019-01-27 NOTE — Patient Instructions (Addendum)
Return for a follow up appointment in 4 weeks  Go to the lab in 10-14 days  Check your blood pressure at home daily (if able) and keep record of the readings.  Take your BP meds as follows: *INCREASE lisinopril 5mg  (2 tablets of 2.5mg  tablet) every evening* *CONTINUE all other medication as previously prescribed*   Bring all of your meds, your BP cuff and your record of home blood pressures to your next appointment.  Exercise as you're able, try to walk approximately 30 minutes per day.  Keep salt intake to a minimum, especially watch canned and prepared boxed foods.  Eat more fresh fruits and vegetables and fewer canned items.  Avoid eating in fast food restaurants.    HOW TO TAKE YOUR BLOOD PRESSURE: . Rest 5 minutes before taking your blood pressure. .  Don't smoke or drink caffeinated beverages for at least 30 minutes before. . Take your blood pressure before (not after) you eat. . Sit comfortably with your back supported and both feet on the floor (don't cross your legs). . Elevate your arm to heart level on a table or a desk. . Use the proper sized cuff. It should fit smoothly and snugly around your bare upper arm. There should be enough room to slip a fingertip under the cuff. The bottom edge of the cuff should be 1 inch above the crease of the elbow. . Ideally, take 3 measurements at one sitting and record the average.

## 2019-01-28 ENCOUNTER — Encounter: Payer: Self-pay | Admitting: Pharmacist

## 2019-01-28 NOTE — Assessment & Plan Note (Addendum)
Blood pressure remains above goal, but significantly improved since last OV. Patient denies problems with current medication and continues to experience hip and foot pain. She stop chronic Naproxen and Tylenol use. We discussed few non-pharmacological alternatives for pain control until f/u with PCP and orthopedic provider. Will increase lisinopril to 5mg  daily, repeat BMET in 10-14 days and follow up with HTN clinic in 4 weeks.

## 2019-02-02 LAB — BASIC METABOLIC PANEL
BUN / CREAT RATIO: 23 (ref 12–28)
BUN: 21 mg/dL (ref 8–27)
CO2: 23 mmol/L (ref 20–29)
Calcium: 9.5 mg/dL (ref 8.7–10.3)
Chloride: 101 mmol/L (ref 96–106)
Creatinine, Ser: 0.92 mg/dL (ref 0.57–1.00)
GFR calc Af Amer: 69 mL/min/{1.73_m2} (ref >59)
GFR calc non Af Amer: 60 mL/min/{1.73_m2} (ref >59)
Glucose: 96 mg/dL (ref 65–99)
Potassium: 4.3 mmol/L (ref 3.5–5.2)
Sodium: 140 mmol/L (ref 134–144)

## 2019-02-24 ENCOUNTER — Ambulatory Visit (INDEPENDENT_AMBULATORY_CARE_PROVIDER_SITE_OTHER): Payer: Medicare Other | Admitting: Pharmacist

## 2019-02-24 VITALS — BP 124/68 | HR 93 | Resp 14 | Ht <= 58 in | Wt 131.0 lb

## 2019-02-24 DIAGNOSIS — I10 Essential (primary) hypertension: Secondary | ICD-10-CM

## 2019-02-24 NOTE — Assessment & Plan Note (Signed)
Blood pressure at goal today. Patient denies problems with current medication and reports compliance. Will continue current medication regimen without changes and follow up with HTN clinic as needed.

## 2019-02-24 NOTE — Patient Instructions (Signed)
Return for a follow up appointment as needed  Check your blood pressure at home daily (if able) and keep record of the readings.  Take your BP meds as follows: *NO MEDICATION CHANGES*  Bring all of your meds, your BP cuff and your record of home blood pressures to your next appointment.  Exercise as you're able, try to walk approximately 30 minutes per day.  Keep salt intake to a minimum, especially watch canned and prepared boxed foods.  Eat more fresh fruits and vegetables and fewer canned items.  Avoid eating in fast food restaurants.    HOW TO TAKE YOUR BLOOD PRESSURE: . Rest 5 minutes before taking your blood pressure. .  Don't smoke or drink caffeinated beverages for at least 30 minutes before. . Take your blood pressure before (not after) you eat. . Sit comfortably with your back supported and both feet on the floor (don't cross your legs). . Elevate your arm to heart level on a table or a desk. . Use the proper sized cuff. It should fit smoothly and snugly around your bare upper arm. There should be enough room to slip a fingertip under the cuff. The bottom edge of the cuff should be 1 inch above the crease of the elbow. . Ideally, take 3 measurements at one sitting and record the average.    

## 2019-02-24 NOTE — Progress Notes (Signed)
Patient ID: Regina Ramsey                 DOB: 31-Dec-1941                      MRN: [id number]     HPI: Regina Ramsey is a 78 y.o. female referred by Dr. Debara Pickett to HTN clinic. PMH includes palpitations, hypertension, hyperlipidemia, severe IBS-D, GERD, and chronic LBBB.  Patient will like to avoid any medication that may irritate her GI or worsen her IBS. She denies problems with lisinopril currently or in the past. I increased her lisinopril dose from 2.5mg  daily to 5mg  during last office visit. Patient denies swelling, dizziness, shortness of breath, or blurry vision .  Current HTN meds:  Lisinopril 5mg  daily (PM)  Metoprolol 50mg  daily (AM)   Previously tried:  Amlodipine 5mg  daily - substituted by metoprolol Lisinopril/HCTZ Losartan/HCTZ Telmisartan/HCTZ - diarrhea   BP goal: 130/80  Family History: The patient's family history includes Arthritis in her father and mother; Bone cancer in her mother; Bradycardia in her son; Breast cancer in her maternal aunt and mother; Cancer in her mother; Cancer - Prostate in her brother; Diabetes in an other family member; Heart Problems in her daughter; Hyperlipidemia in her maternal grandmother. She was adopted.   Social History: former smoker, alcohol social (once per month)  Diet: GERD diet, no carbonated drinks, eats out frequently (6 of 7 days dinner), skip breakfast  Exercise: no regular exercise   Home BP readings:  22 morning readings; average 121/70; HR 60-79 19 evening readings; average 122/70; HR 67-84  Wt Readings from Last 3 Encounters:  02/24/19 131 lb (59.4 kg)  01/02/19 131 lb 12.8 oz (59.8 kg)  11/21/18 129 lb 3.2 oz (58.6 kg)   BP Readings from Last 3 Encounters:  02/24/19 124/68  01/27/19 (!) 158/64  01/02/19 (!) 187/87   Pulse Readings from Last 3 Encounters:  02/24/19 93  01/27/19 80  01/02/19 72    Past Medical History:  Diagnosis Date  . ALLERGIC RHINITIS   . ANEMIA-NOS   . Anxiety   . Asthma    Triggered by allergies  . COPD    normal PFTs 06/22/11  . Diverticulosis of colon   . Dyspareunia   . Endometriosis   . GERD   . HEARING LOSS 07/13/2010  . HYPERLIPIDEMIA   . HYPERTENSION   . Irritable bowel syndrome   . MIGRAINE HEADACHE   . OSTEOPOROSIS    on Prolia q 69mo, hx vertebral fx  . PALPITATIONS, HX OF   . Vaginal atrophy     Current Outpatient Medications on File Prior to Visit  Medication Sig Dispense Refill  . acetaminophen (TYLENOL) 500 MG tablet Take 500 mg by mouth every 8 (eight) hours as needed (pain).    Marland Kitchen aspirin EC 81 MG tablet Take 81 mg by mouth daily.    Marland Kitchen atorvastatin (LIPITOR) 40 MG tablet Take 1 tablet (40 mg total) by mouth daily at 6 PM. 30 tablet 6  . calcium carbonate (TUMS - DOSED IN MG ELEMENTAL CALCIUM) 500 MG chewable tablet Chew 1 tablet by mouth daily.    Marland Kitchen lisinopril (PRINIVIL,ZESTRIL) 2.5 MG tablet Take 2 tablets (5 mg total) by mouth daily. 60 tablet 1  . metoprolol succinate (TOPROL XL) 50 MG 24 hr tablet Take 1 tablet (50 mg total) by mouth daily. Take with or immediately following a meal. 30 tablet 6   No current facility-administered  medications on file prior to visit.     No Known Allergies  Blood pressure 124/68, pulse 93, resp. rate 14, height 4\' 10"  (1.473 m), weight 131 lb (59.4 kg), SpO2 95 %.    Essential hypertension Blood pressure at goal today. Patient denies problems with current medication and reports compliance. Will continue current medication regimen without changes and follow up with HTN clinic as needed.   Jowana Thumma Rodriguez-Guzman PharmD, BCPS, [place] [address] 02/24/2019 4:36 PM

## 2019-03-06 ENCOUNTER — Ambulatory Visit (INDEPENDENT_AMBULATORY_CARE_PROVIDER_SITE_OTHER): Payer: Medicare Other

## 2019-03-06 DIAGNOSIS — M810 Age-related osteoporosis without current pathological fracture: Secondary | ICD-10-CM

## 2019-03-06 MED ORDER — DENOSUMAB 60 MG/ML ~~LOC~~ SOSY
60.0000 mg | PREFILLED_SYRINGE | Freq: Once | SUBCUTANEOUS | Status: AC
  Administered 2019-03-06: 60 mg via SUBCUTANEOUS

## 2019-03-06 NOTE — Progress Notes (Signed)
prolia Injection given.   Dung Prien J Pelagia Iacobucci, MD  

## 2019-03-26 ENCOUNTER — Other Ambulatory Visit: Payer: Self-pay | Admitting: Internal Medicine

## 2019-03-27 ENCOUNTER — Telehealth: Payer: Self-pay | Admitting: Internal Medicine

## 2019-03-27 NOTE — Telephone Encounter (Signed)
LMTCB... sent message to [employer]... Billing [facility].. re: charges.. pt had Cardiac CT 10/28/18.Marland Kitchen

## 2019-03-27 NOTE — Telephone Encounter (Signed)
Patient is stating is got a bill for a other cardiac CT that was done on 11/04/2018, she is stating that she only had one done on October 2019.  She says she wasn't even in the state on 11[place]04[place]18.  She says she spoke to billing about this and they won't help her, nor will her insurance company.  She wants to know if there is a written report for the 11/04/18 study.  She said that she is ready to go the police about this matter.

## 2019-03-27 NOTE — Telephone Encounter (Signed)
Per Caren Griffins message to me.. pt has to call [facility] billing....  [03/27/2019 1:47 PM]  [employer]:   [phone]

## 2019-04-03 ENCOUNTER — Telehealth: Payer: Self-pay | Admitting: Internal Medicine

## 2019-04-03 NOTE — Telephone Encounter (Signed)
Left message to call back  

## 2019-04-03 NOTE — Telephone Encounter (Signed)
New message;   Patient calling because she had a appt in 10/28/18. Patient received a bill stating she was a No show. Patient was there. Please call patient.

## 2019-04-06 NOTE — Telephone Encounter (Signed)
Called patient, she states she spoke with billing here, and her insurance company on a three way call last week and they are suppose to be fixing this. Patient was told to wait for about a month to have answers. Patient has no new questions at this time, and was advised to call back if she had no heard anything after this time period.   Patient verbalized understanding.

## 2019-05-15 ENCOUNTER — Ambulatory Visit: Payer: Medicare Other | Admitting: Internal Medicine

## 2019-05-17 NOTE — Progress Notes (Signed)
Virtual Visit via Video Note  I connected with Regina Ramsey on 05/18/19 at  2:45 PM EDT by a video enabled telemedicine application and verified that I am speaking with the correct person using two identifiers.   I discussed the limitations of evaluation and management by telemedicine and the availability of in person appointments. The patient expressed understanding and agreed to proceed.  The patient is currently at home and I am in the office.    No referring provider.    History of Present Illness: She is here for follow up of her chronic medical conditions.   She is exercising regularly - walking around the block.    CAD, hypertension: She is taking her medication daily.  She has been following at the hypertension clinic with cardiology.  Since I saw her last she did have a heart CT and a heart catheterization.  She is compliant with a low sodium diet.  She denies chest pain, palpitations, shortness of breath and regular headaches. She does monitor her blood pressure at home.    Prediabetes:  She is compliant with a low sugar/carbohydrate diet.  She is exercising regularly.  Hyperlipidemia: She is taking her medication daily. She is compliant with a low fat/cholesterol diet. She denies myalgias.  Osteoporosis: She is getting Prolia injections twice a year.  She is taking Tums daily-possibly taking too many Tums.   Medication list reviewed and is up to date.    Review of Systems  Constitutional: Negative for chills and fever.  Respiratory: Positive for cough (allergy related). Negative for shortness of breath and wheezing.   Cardiovascular: Positive for leg swelling (mild on occ). Negative for chest pain and palpitations.  Gastrointestinal: Positive for heartburn.  Neurological: Positive for dizziness. Negative for headaches.     Social History   Socioeconomic History  . Marital status: Married    Spouse name: Not on file  . Number of children: 2  . Years of  education: Not on file  . Highest education level: Not on file  Occupational History  . Occupation: Retired  Scientific laboratory technician  . Financial resource strain: Not on file  . Food insecurity:    Worry: Not on file    Inability: Not on file  . Transportation needs:    Medical: Not on file    Non-medical: Not on file  Tobacco Use  . Smoking status: Former Smoker    Types: Cigarettes    Last attempt to quit: 01/01/1964    Years since quitting: 55.4  . Smokeless tobacco: Never Used  . Tobacco comment: Married, lives with souse. Master of education-specialist needs for grade school  Substance and Sexual Activity  . Alcohol use: Yes    Alcohol/week: 0.0 standard drinks    Comment: Social  . Drug use: No  . Sexual activity: Not on file  Lifestyle  . Physical activity:    Days per week: Not on file    Minutes per session: Not on file  . Stress: Not on file  Relationships  . Social connections:    Talks on phone: Not on file    Gets together: Not on file    Attends religious service: Not on file    Active member of club or organization: Not on file    Attends meetings of clubs or organizations: Not on file    Relationship status: Not on file  Other Topics Concern  . Not on file  Social History Narrative   Retired Pharmacist, hospital  No regular exercise, active      epworth sleepiness scale = 10 (05/30/16)     Observations/Objective: Appears well in NAD  BP this morning 135/76, at night 111-120/58, 117/62  Assessment and Plan:  See Problem List for Assessment and Plan of chronic medical problems.   Follow Up Instructions:    I discussed the assessment and treatment plan with the patient. The patient was provided an opportunity to ask questions and all were answered. The patient agreed with the plan and demonstrated an understanding of the instructions.   The patient was advised to call back or seek an in-person evaluation if the symptoms worsen or if the condition fails to improve  as anticipated.  6 months with blood work at that time    Binnie Rail, MD

## 2019-05-18 ENCOUNTER — Encounter: Payer: Self-pay | Admitting: Internal Medicine

## 2019-05-18 ENCOUNTER — Ambulatory Visit (INDEPENDENT_AMBULATORY_CARE_PROVIDER_SITE_OTHER): Payer: Medicare Other | Admitting: Internal Medicine

## 2019-05-18 DIAGNOSIS — R7303 Prediabetes: Secondary | ICD-10-CM | POA: Diagnosis not present

## 2019-05-18 DIAGNOSIS — M810 Age-related osteoporosis without current pathological fracture: Secondary | ICD-10-CM | POA: Diagnosis not present

## 2019-05-18 DIAGNOSIS — I2584 Coronary atherosclerosis due to calcified coronary lesion: Secondary | ICD-10-CM

## 2019-05-18 DIAGNOSIS — I10 Essential (primary) hypertension: Secondary | ICD-10-CM

## 2019-05-18 DIAGNOSIS — E782 Mixed hyperlipidemia: Secondary | ICD-10-CM

## 2019-05-18 DIAGNOSIS — I2510 Atherosclerotic heart disease of native coronary artery without angina pectoris: Secondary | ICD-10-CM

## 2019-05-18 MED ORDER — ATORVASTATIN CALCIUM 20 MG PO TABS: 40.0000 mg | ORAL_TABLET | Freq: Every day | ORAL | Status: DC

## 2019-05-18 MED ORDER — LEVOCETIRIZINE DIHYDROCHLORIDE 5 MG PO TABS: 5.0000 mg | ORAL_TABLET | Freq: Every evening | ORAL | Status: DC

## 2019-05-18 MED ORDER — ATORVASTATIN CALCIUM 40 MG PO TABS: 40.0000 mg | ORAL_TABLET | Freq: Every day | ORAL | 6 refills | Status: DC

## 2019-05-18 NOTE — Assessment & Plan Note (Addendum)
Well-controlled at home Continue current medications at current doses We will hold off on any blood work at this time Follow-up in 6 months

## 2019-05-18 NOTE — Assessment & Plan Note (Signed)
Lab Results  Component Value Date   HGBA1C 6.2 05/08/2018   Sugars have been stable in the prediabetic range so we will hold off on rechecking her A1c until 6 months Continue regular exercise and diabetic diet

## 2019-05-18 NOTE — Assessment & Plan Note (Signed)
Continue atorvastatin

## 2019-05-18 NOTE — Assessment & Plan Note (Signed)
Continue atorvastatin We will check blood work at her next visit

## 2019-05-18 NOTE — Assessment & Plan Note (Signed)
Getting Prolia injections-last one done 03/06/2019 Continue above Continue regular walking Taking Tums for calcium on a regular basis

## 2019-06-01 ENCOUNTER — Other Ambulatory Visit: Payer: Self-pay | Admitting: Physician Assistant

## 2019-06-04 ENCOUNTER — Telehealth: Payer: Self-pay | Admitting: Internal Medicine

## 2019-06-04 NOTE — Telephone Encounter (Signed)
-----   Message from Binnie Rail, MD sent at 05/18/2019  3:44 PM EDT ----- She needs a 36-month follow-up-hypertension, CAD, OP

## 2019-06-04 NOTE — Telephone Encounter (Signed)
Left message for patient to call back to schedule.  °

## 2019-06-04 NOTE — Telephone Encounter (Signed)
Pt called to return call. After telling pt what the call was regarding she stated her phone goes on and off and that she would call back at a later to time to schedule.

## 2019-06-18 NOTE — Telephone Encounter (Signed)
Regina Ramsey actually increased the dose in January - ok to fill the 50 mg dose

## 2019-06-19 MED ORDER — METOPROLOL SUCCINATE ER 25 MG PO TB24: 25.0000 mg | ORAL_TABLET | Freq: Two times a day (BID) | ORAL | 1 refills | Status: DC

## 2019-07-13 ENCOUNTER — Telehealth: Payer: Self-pay | Admitting: Internal Medicine

## 2019-07-13 NOTE — Telephone Encounter (Signed)
LM, reminding pt of her appt with Dr Debara Pickett on 07-14-19.

## 2019-07-14 ENCOUNTER — Telehealth: Payer: Self-pay

## 2019-07-14 ENCOUNTER — Encounter: Payer: Self-pay | Admitting: Internal Medicine

## 2019-07-14 ENCOUNTER — Telehealth (INDEPENDENT_AMBULATORY_CARE_PROVIDER_SITE_OTHER): Payer: Medicare Other | Admitting: Internal Medicine

## 2019-07-14 VITALS — BP 117/65 | HR 69 | Wt 132.0 lb

## 2019-07-14 DIAGNOSIS — I2510 Atherosclerotic heart disease of native coronary artery without angina pectoris: Secondary | ICD-10-CM | POA: Diagnosis not present

## 2019-07-14 DIAGNOSIS — E782 Mixed hyperlipidemia: Secondary | ICD-10-CM

## 2019-07-14 DIAGNOSIS — I447 Left bundle-branch block, unspecified: Secondary | ICD-10-CM | POA: Diagnosis not present

## 2019-07-14 DIAGNOSIS — R0602 Shortness of breath: Secondary | ICD-10-CM

## 2019-07-14 DIAGNOSIS — R002 Palpitations: Secondary | ICD-10-CM

## 2019-07-14 DIAGNOSIS — I10 Essential (primary) hypertension: Secondary | ICD-10-CM

## 2019-07-14 DIAGNOSIS — R7303 Prediabetes: Secondary | ICD-10-CM

## 2019-07-14 DIAGNOSIS — I2584 Coronary atherosclerosis due to calcified coronary lesion: Secondary | ICD-10-CM

## 2019-07-14 NOTE — Patient Instructions (Signed)
Medication Instructions:  Your physician recommends that you continue on your current medications as directed. Please refer to the Current Medication list given to you today.  If you need a refill on your cardiac medications before your next appointment, please call your pharmacy.   Lab work: FASTING lipid panel If you have labs (blood work) drawn today and your tests are completely normal, you will receive your results only by: Marland Kitchen MyChart Message (if you have MyChart) OR . A paper copy in the mail If you have any lab test that is abnormal or we need to change your treatment, we will call you to review the results.  Testing/Procedures: NONE  Follow-Up: At [facility], you and your health needs are our priority.  As part of our continuing mission to provide you with exceptional heart care, we have created designated Provider Care Teams.  These Care Teams include your primary Cardiologist (physician) and Advanced Practice Providers (APPs -  Physician Assistants and Nurse Practitioners) who all work together to provide you with the care you need, when you need it. You will need a follow up appointment in 6 months.  Please call our office 2 months in advance to schedule this appointment.  You may see Dr. Debara Pickett or one of the following Advanced Practice Providers on your designated Care Team: Almyra Deforest, [place] . Fabian Sharp, PA-C

## 2019-07-14 NOTE — Progress Notes (Signed)
Virtual Visit via Video Note   This visit type was conducted due to national recommendations for restrictions regarding the COVID-19 Pandemic (e.g. social distancing) in an effort to limit this patient's exposure and mitigate transmission in our community.  Due to her co-morbid illnesses, this patient is at least at moderate risk for complications without adequate follow up.  This format is felt to be most appropriate for this patient at this time.  All issues noted in this document were discussed and addressed.  A limited physical exam was performed with this format.  Please refer to the patient's chart for her consent to telehealth for [facility].   Evaluation Performed:  Doxy.me video  Date:  07/14/2019   ID:  Regina Ramsey, DOB May 18, 1941, MRN [id number]  Patient Location:  [place] [address]  Provider location:   [address] [place], [place] [address]  PCP:  Binnie Rail, MD  Cardiologist:  No primary care provider on file. Electrophysiologist:  None   Chief Complaint:  No complaints  History of Present Illness:    Regina Ramsey is a 78 y.o. female who presents via audio/video conferencing for a telehealth visit today.  [name] returns today for follow-up.  She was seen by video visit.  She has no new complaints.  In November we performed cardiac CT for symptoms concerning for angina.  She was noted to have a high coronary artery calcium score of 500 with multivessel calcification.  The study was then sent for CT FFR to rule out high-grade proximal disease particularly in the LAD.  The FFR was not significant other than the distal LAD however a definitive left heart catheterization was performed which actually corroborated those findings.  There was no significant obstructive disease, small intramyocardial segment of the LAD was noted, but otherwise mild nonobstructive coronary disease with calcification.  Aggressive medical therapy  was recommended.  Her blood pressure had remained elevated but after working with the hypertension clinic now is better controlled.  She also had a lipid profile in January with total cholesterol 145, triglycerides 104, HDL 41 and LDL of 83.  At the time her a atorvastatin was then increased to 40 mg daily to target LDL less than 70.  She is also been working on weight loss and more regular exercise.  The patient does not have symptoms concerning for COVID-19 infection (fever, chills, cough, or new SHORTNESS OF BREATH).    Prior CV studies:   The following studies were reviewed today:  Chart reviewed Lab work CT FFR Left heart catheterization  PMHx:  Past Medical History:  Diagnosis Date  . ALLERGIC RHINITIS   . ANEMIA-NOS   . Anxiety   . Asthma    Triggered by allergies  . COPD    normal PFTs 06/22/11  . Diverticulosis of colon   . Dyspareunia   . Endometriosis   . GERD   . HEARING LOSS 07/13/2010  . HYPERLIPIDEMIA   . HYPERTENSION   . Irritable bowel syndrome   . MIGRAINE HEADACHE   . OSTEOPOROSIS    on Prolia q 93mo hx vertebral fx  . PALPITATIONS, HX OF   . Vaginal atrophy     Past Surgical History:  Procedure Laterality Date  . ABDOMINAL HYSTERECTOMY  1974  . APPENDECTOMY  1958  . BREAST SURGERY  1985   breast biopsy  . LEFT HEART CATH AND CORONARY ANGIOGRAPHY N/A 12/03/2018   Procedure: LEFT HEART CATH AND CORONARY  ANGIOGRAPHY;  Surgeon: Troy Sine, MD;  Location: [facility];  Service: Cardiovascular;  Laterality: N/A;    FAMHx:  Family History  Adopted: Yes  Problem Relation Age of Onset  . Arthritis Mother   . Breast cancer Mother   . Cancer Mother        started on her spine met cancer  . Bone cancer Mother   . Arthritis Father   . Breast cancer Maternal Aunt   . Diabetes Other        parent & grandparent  . Hyperlipidemia Maternal Grandmother   . Bradycardia Son   . Heart Problems Daughter        enlarged heart  . Cancer - Prostate  Brother   . Colon cancer Neg Hx   . Colon polyps Neg Hx     SOCHx:   reports that she quit smoking about 55 years ago. Her smoking use included cigarettes. She has never used smokeless tobacco. She reports current alcohol use. She reports that she does not use drugs.  ALLERGIES:  No Known Allergies  MEDS:  Current Meds  Medication Sig  . acetaminophen (TYLENOL) 500 MG tablet Take 500 mg by mouth every 8 (eight) hours as needed (pain).  Marland Kitchen aspirin EC 81 MG tablet Take 81 mg by mouth daily.  Marland Kitchen atorvastatin (LIPITOR) 20 MG tablet Take 2 tablets (40 mg total) by mouth daily at 6 PM.  . calcium carbonate (TUMS - DOSED IN MG ELEMENTAL CALCIUM) 500 MG chewable tablet Chew 1 tablet by mouth daily.  Marland Kitchen lisinopril (ZESTRIL) 2.5 MG tablet TAKE 1 TABLET(2.5 MG) BY MOUTH DAILY (Patient taking differently: Take 5 mg by mouth daily. )  . metoprolol succinate (TOPROL-XL) 25 MG 24 hr tablet Take 1 tablet (25 mg total) by mouth 2 (two) times a day. Take with or immediately following a meal.     ROS: Pertinent items noted in HPI and remainder of comprehensive ROS otherwise negative.  Labs/Other Tests and Data Reviewed:    Recent Labs: 11/21/2018: Hemoglobin 13.3; Platelets 263 01/08/2019: ALT 21 02/02/2019: BUN 21; Creatinine, Ser 0.92; Potassium 4.3; Sodium 140   Recent Lipid Panel Lab Results  Component Value Date/Time   CHOL 145 01/08/2019 11:33 AM   TRIG 104 01/08/2019 11:33 AM   TRIG 117 08/03/2009   HDL 41 01/08/2019 11:33 AM   CHOLHDL 3.5 01/08/2019 11:33 AM   CHOLHDL 4 05/08/2018 08:31 AM   LDLCALC 83 01/08/2019 11:33 AM   LDLDIRECT 193.0 07/13/2010 12:00 AM    Wt Readings from Last 3 Encounters:  07/14/19 132 lb (59.9 kg)  02/24/19 131 lb (59.4 kg)  01/02/19 131 lb 12.8 oz (59.8 kg)     Exam:    Vital Signs:  BP 117/65   Pulse 69   Wt 132 lb (59.9 kg)   BMI 27.59 kg/m    General appearance: alert and no distress Lungs: No visual respiratory difficulty Abdomen: Mildly  overweight Extremities: extremities normal, atraumatic, no cyanosis or edema Skin: Skin color, texture, turgor normal. No rashes or lesions Neurologic: Mental status: Alert, oriented, thought content appropriate Psych: Pleasant  ASSESSMENT & PLAN:    1. Multivessel coronary artery disease with heavy calcification and CAC 500, nonobstructive coronary disease by cath (11/2018) with 20% mid LAD stenosis and 15% proximal to mid LAD stenosis and intramyocardial bridge of the mid LAD 2. Essential hypertension 3. Mixed dyslipidemia 4. LBBB 5. Palpitations  Ms. Horrigan actually is feeling much better.  She started  walking more regularly.  Her blood pressure is better controlled.  She is working on weight loss.  I had increased her atorvastatin and we will repeat a lipid profile.  She wishes to have that drawn at her PCPs office due to issues regarding billing with [facility].  Overall she is doing well.  No medication adjustments were made today.  We will plan follow-up in 6 months or sooner as necessary.  COVID-19 Education: The signs and symptoms of COVID-19 were discussed with the patient and how to seek care for testing (follow up with PCP or arrange E-visit).  The importance of social distancing was discussed today.  Patient Risk:   After full review of this patients clinical status, I feel that they are at least moderate risk at this time.   Time:   Today, I have spent 25 minutes with the patient with telehealth technology discussing dyspnea on exertion, left heart cath, CT FFR, palpitations, dyslipidemia, hypertension.     Medication Adjustments/Labs and Tests Ordered: Current medicines are reviewed at length with the patient today.  Concerns regarding medicines are outlined above.   Tests Ordered: No orders of the defined types were placed in this encounter.   Medication Changes: No orders of the defined types were placed in this encounter.   Disposition:  in 6 month(s)  Pixie Casino, MD, [facility], [place] Director of the Advanced Lipid Disorders &  Cardiovascular Risk Reduction Clinic Diplomate of the American Board of Clinical Lipidology Attending Cardiologist  Direct Dial: [phone]  Fax: [phone]  Website:  [email or web address]  Pixie Casino, MD  07/14/2019 9:13 AM

## 2019-07-14 NOTE — Telephone Encounter (Signed)

## 2019-07-17 ENCOUNTER — Other Ambulatory Visit (INDEPENDENT_AMBULATORY_CARE_PROVIDER_SITE_OTHER): Payer: Medicare Other

## 2019-07-17 DIAGNOSIS — E782 Mixed hyperlipidemia: Secondary | ICD-10-CM

## 2019-07-17 DIAGNOSIS — R7303 Prediabetes: Secondary | ICD-10-CM | POA: Diagnosis not present

## 2019-07-17 LAB — LIPID PANEL
Cholesterol: 119 mg/dL (ref 0–200)
HDL: 40.5 mg/dL (ref >39.00)
LDL Cholesterol: 59 mg/dL (ref 0–99)
NonHDL: 78.47
Total CHOL/HDL Ratio: 3
Triglycerides: 97 mg/dL (ref 0.0–149.0)
VLDL: 19.4 mg/dL (ref 0.0–40.0)

## 2019-07-17 LAB — COMPREHENSIVE METABOLIC PANEL
ALT: 17 U/L (ref 0–35)
AST: 18 U/L (ref 0–37)
Albumin: 4.2 g/dL (ref 3.5–5.2)
Alkaline Phosphatase: 56 U/L (ref 39–117)
BUN: 15 mg/dL (ref 6–23)
CO2: 26 mEq/L (ref 19–32)
Calcium: 8.5 mg/dL (ref 8.4–10.5)
Chloride: 106 mEq/L (ref 96–112)
Creatinine, Ser: 0.83 mg/dL (ref 0.40–1.20)
GFR: 66.43 mL/min (ref >60.00)
Glucose, Bld: 111 mg/dL — ABNORMAL HIGH (ref 70–99)
Potassium: 3.6 mEq/L (ref 3.5–5.1)
Sodium: 141 mEq/L (ref 135–145)
Total Bilirubin: 0.6 mg/dL (ref 0.2–1.2)
Total Protein: 6.8 g/dL (ref 6.0–8.3)

## 2019-07-17 LAB — HEMOGLOBIN A1C: Hgb A1c MFr Bld: 6.1 % (ref 4.6–6.5)

## 2019-07-19 ENCOUNTER — Encounter: Payer: Self-pay | Admitting: Internal Medicine

## 2019-07-25 ENCOUNTER — Other Ambulatory Visit: Payer: Self-pay | Admitting: Internal Medicine

## 2019-07-28 ENCOUNTER — Other Ambulatory Visit: Payer: Self-pay | Admitting: Physician Assistant

## 2019-07-29 ENCOUNTER — Telehealth: Payer: Self-pay | Admitting: Physician Assistant

## 2019-07-29 MED ORDER — ATORVASTATIN CALCIUM 20 MG PO TABS: 40.0000 mg | ORAL_TABLET | Freq: Every day | ORAL | 3 refills | Status: DC

## 2019-07-29 NOTE — Telephone Encounter (Signed)
Pt states she was instructed during 01/02/2019 appt with Almyra Deforest, PA-C to increase her atorvastatin to 40 mg daily but she is looking at paperwork that states that she is to take 20 mg daily. Reviewed pt most recent virtual visit on 7/14 which stated:  ASSESSMENT & PLAN:    1. Multivessel coronary artery disease with heavy calcification and CAC 500, nonobstructive coronary disease by cath (11/2018) with 20% mid LAD stenosis and 15% proximal to mid LAD stenosis and intramyocardial bridge of the mid LAD 2. Essential hypertension 3. Mixed dyslipidemia 4. LBBB 5. Palpitations  Ms. Conrow actually is feeling much better.  She started walking more regularly.  Her blood pressure is better controlled.  She is working on weight loss.  I had increased her atorvastatin and we will repeat a lipid profile.  She wishes to have that drawn at her PCPs office due to issues regarding billing with [facility].  Overall she is doing well.  No medication adjustments were made today.  We will plan follow-up in 6 months or sooner as necessary.  7/14 AVS med list states:  atorvastatin 20 MG tablet Commonly known as: LIPITOR Take 2 tablets (40 mg total) by mouth daily at 6 PM.   Sent Rx for atorvastatin 20 mg for 2 tablets (40 mg total) by mouth daily at 6 pm

## 2019-07-29 NOTE — Telephone Encounter (Signed)
New Message    Pt c/o medication issue:  1. Name of Medication: Atorvastatin  2. How are you currently taking this medication (dosage and times per day)? 40mg  2 tablets twice a day   3. Are you having a reaction (difficulty breathing--STAT)? No  4. What is your medication issue? Patient state Regina Ramsey increased her medication to 40mg  but in her chart it's still listed at 20mg .  Please call patient to go over medication and she needs it refilled as well.

## 2019-07-29 NOTE — Telephone Encounter (Signed)
New Message     *STAT* If patient is at the pharmacy, call can be transferred to refill team.   1. Which medications need to be refilled? (please list name of each medication and dose if known)  Lisinopril 2.5mg   2. Which pharmacy/location (including street and city if local pharmacy) is medication to be sent to? [facility]  3. Do they need a 30 day or 90 day supply? 90 day supply

## 2019-08-17 NOTE — Progress Notes (Signed)
Subjective:    Patient ID: Regina Ramsey, female    DOB: [birth year], 78 y.o.   MRN: [id number]  HPI The patient is here for follow up.   She is exercising regularly - walking .  Prediabetes:  She is compliant with a low sugar/carbohydrate diet.  She is exercising regularly.  CAD, Hypertension: She is taking her medication daily. She is compliant with a low sodium diet.  She denies chest pain, palpitations, edema, shortness of breath and regular headaches. She does monitor her blood pressure at home.       Hyperlipidemia: She is taking her medication daily. She is compliant with a low fat/cholesterol diet. She denies myalgias.   Osteoporosis:  She is getting prolia injections twice a year.   Her last prolia was 03/06/19.  She is exercising.  She was taking Tums daily, but stopped taking that.  She is not currently taking any other calcium or vitamin D daily.  Her urine is very yellow.  She is drinking tea, apple cider and sprite.  She does not drink much water.  She denies dysuria, hematuria, fever, abdominal.     Medications and allergies reviewed with patient and updated if appropriate.  Patient Active Problem List   Diagnosis Date Noted  . CAD in native artery   . Spondylolisthesis, lumbar region 10/25/2018  . Plantar fasciitis 10/14/2018  . Right hip pain 10/14/2018  . Coronary artery calcification 10/01/2018  . B12 deficiency 05/08/2018  . IBS (irritable bowel syndrome) 06/03/2017  . Low back pain 10/11/2016  . Shortness of breath 05/31/2016  . LBBB (left bundle branch block) 05/31/2016  . Prediabetes 04/15/2016  . Palpitations 04/11/2016  . History of colonic polyps 03/07/2015  . Vaginal atrophy 07/23/2014  . Dyspareunia 07/23/2014  . Hx of endometriosis 06/30/2014  . Diverticulosis of colon 06/30/2014  . Osteoporosis 08/16/2010  . Hyperlipidemia 07/13/2010  . ANEMIA-NOS 07/13/2010  . HEARING LOSS 07/13/2010  . Essential hypertension 07/13/2010  . GERD  07/13/2010    Current Outpatient Medications on File Prior to Visit  Medication Sig Dispense Refill  . acetaminophen (TYLENOL) 500 MG tablet Take 500 mg by mouth every 8 (eight) hours as needed (pain).    Marland Kitchen aspirin EC 81 MG tablet Take 81 mg by mouth daily.    Marland Kitchen atorvastatin (LIPITOR) 20 MG tablet Take 2 tablets (40 mg total) by mouth daily at 6 PM. 90 tablet 3  . calcium carbonate (TUMS - DOSED IN MG ELEMENTAL CALCIUM) 500 MG chewable tablet Chew 1 tablet by mouth daily.    Marland Kitchen lisinopril (ZESTRIL) 5 MG tablet Take 1 tablet (5 mg total) by mouth daily. 30 tablet 5  . metoprolol succinate (TOPROL-XL) 25 MG 24 hr tablet Take 1 tablet (25 mg total) by mouth 2 (two) times a day. Take with or immediately following a meal. 180 tablet 1   No current facility-administered medications on file prior to visit.     Past Medical History:  Diagnosis Date  . ALLERGIC RHINITIS   . ANEMIA-NOS   . Anxiety   . Asthma    Triggered by allergies  . COPD    normal PFTs 06/22/11  . Diverticulosis of colon   . Dyspareunia   . Endometriosis   . GERD   . HEARING LOSS 07/13/2010  . HYPERLIPIDEMIA   . HYPERTENSION   . Irritable bowel syndrome   . MIGRAINE HEADACHE   . OSTEOPOROSIS    on Prolia q 84mo hx vertebral fx  .  PALPITATIONS, HX OF   . Vaginal atrophy     Past Surgical History:  Procedure Laterality Date  . ABDOMINAL HYSTERECTOMY  1974  . APPENDECTOMY  1958  . BREAST SURGERY  1985   breast biopsy  . LEFT HEART CATH AND CORONARY ANGIOGRAPHY N/A 12/03/2018   Procedure: LEFT HEART CATH AND CORONARY ANGIOGRAPHY;  Surgeon: Troy Sine, MD;  Location: [facility];  Service: Cardiovascular;  Laterality: N/A;    Social History   Socioeconomic History  . Marital status: Married    Spouse name: Not on file  . Number of children: 2  . Years of education: Not on file  . Highest education level: Not on file  Occupational History  . Occupation: Retired  Scientific laboratory technician  . Financial  resource strain: Not on file  . Food insecurity    Worry: Not on file    Inability: Not on file  . Transportation needs    Medical: Not on file    Non-medical: Not on file  Tobacco Use  . Smoking status: Former Smoker    Types: Cigarettes    Quit date: 01/01/1964    Years since quitting: 55.6  . Smokeless tobacco: Never Used  . Tobacco comment: Married, lives with souse. Master of education-specialist needs for grade school  Substance and Sexual Activity  . Alcohol use: Yes    Alcohol/week: 0.0 standard drinks    Comment: Social  . Drug use: No  . Sexual activity: Not on file  Lifestyle  . Physical activity    Days per week: Not on file    Minutes per session: Not on file  . Stress: Not on file  Relationships  . Social Herbalist on phone: Not on file    Gets together: Not on file    Attends religious service: Not on file    Active member of club or organization: Not on file    Attends meetings of clubs or organizations: Not on file    Relationship status: Not on file  Other Topics Concern  . Not on file  Social History Narrative   Retired Pharmacist, hospital      No regular exercise, active      epworth sleepiness scale = 10 (05/30/16)    Family History  Adopted: Yes  Problem Relation Age of Onset  . Arthritis Mother   . Breast cancer Mother   . Cancer Mother        started on her spine met cancer  . Bone cancer Mother   . Arthritis Father   . Breast cancer Maternal Aunt   . Diabetes Other        parent & grandparent  . Hyperlipidemia Maternal Grandmother   . Bradycardia Son   . Heart Problems Daughter        enlarged heart  . Cancer - Prostate Brother   . Colon cancer Neg Hx   . Colon polyps Neg Hx     Review of Systems  Constitutional: Negative for chills and fever.  Respiratory: Positive for cough (from allergies). Negative for shortness of breath and wheezing.   Cardiovascular: Negative for chest pain, palpitations and leg swelling.  Genitourinary:  Negative for dysuria and hematuria.       Dark urine  Neurological: Negative for light-headedness and headaches.       Objective:   Vitals:   08/18/19 1343  BP: 130/64  Pulse: 99  Resp: 16  Temp: 98.5 F (36.9 C)  SpO2: 96%   BP Readings from Last 3 Encounters:  08/18/19 130/64  07/14/19 117/65  02/24/19 124/68   Wt Readings from Last 3 Encounters:  08/18/19 135 lb 6.4 oz (61.4 kg)  07/14/19 132 lb (59.9 kg)  02/24/19 131 lb (59.4 kg)   Body mass index is 28.3 kg/m.   Physical Exam    Constitutional: Appears well-developed and well-nourished. No distress.  HENT:  Head: Normocephalic and atraumatic.  Neck: Neck supple. No tracheal deviation present. No thyromegaly present.  No cervical lymphadenopathy Cardiovascular: Normal rate, regular rhythm and normal heart sounds.   No murmur heard. No carotid bruit .  No edema Pulmonary/Chest: Effort normal and breath sounds normal. No respiratory distress. No has no wheezes. No rales.  Skin: Skin is warm and dry. Not diaphoretic.  Psychiatric: Normal mood and affect. Behavior is normal.      Assessment & Plan:    See Problem List for Assessment and Plan of chronic medical problems.

## 2019-08-17 NOTE — Patient Instructions (Addendum)
  Medications reviewed and updated.  Changes include :   none    Please followup in 6 months   

## 2019-08-18 ENCOUNTER — Other Ambulatory Visit: Payer: Self-pay

## 2019-08-18 ENCOUNTER — Encounter: Payer: Self-pay | Admitting: Internal Medicine

## 2019-08-18 ENCOUNTER — Ambulatory Visit (INDEPENDENT_AMBULATORY_CARE_PROVIDER_SITE_OTHER): Payer: Medicare Other | Admitting: Internal Medicine

## 2019-08-18 VITALS — BP 130/64 | HR 99 | Temp 98.5°F | Resp 16 | Ht <= 58 in | Wt 135.4 lb

## 2019-08-18 DIAGNOSIS — I2510 Atherosclerotic heart disease of native coronary artery without angina pectoris: Secondary | ICD-10-CM

## 2019-08-18 DIAGNOSIS — I2584 Coronary atherosclerosis due to calcified coronary lesion: Secondary | ICD-10-CM

## 2019-08-18 DIAGNOSIS — R7303 Prediabetes: Secondary | ICD-10-CM | POA: Diagnosis not present

## 2019-08-18 DIAGNOSIS — M810 Age-related osteoporosis without current pathological fracture: Secondary | ICD-10-CM

## 2019-08-18 DIAGNOSIS — E782 Mixed hyperlipidemia: Secondary | ICD-10-CM | POA: Diagnosis not present

## 2019-08-18 DIAGNOSIS — I10 Essential (primary) hypertension: Secondary | ICD-10-CM

## 2019-08-18 DIAGNOSIS — R82998 Other abnormal findings in urine: Secondary | ICD-10-CM

## 2019-08-18 NOTE — Assessment & Plan Note (Addendum)
dexa due - ordered Walking regularly Taking calcium Last Prolia 03/2019-due next month

## 2019-08-18 NOTE — Assessment & Plan Note (Signed)
BP well controlled Current regimen effective and well tolerated Continue current medications at current doses  

## 2019-08-18 NOTE — Assessment & Plan Note (Signed)
No chest pain, palpitations or shortness of breath Restart statin once available from pharmacy Continue aspirin 81 mg and metoprolol, lisinopril

## 2019-08-18 NOTE — Assessment & Plan Note (Signed)
Lab Results  Component Value Date   HGBA1C 6.1 07/17/2019   sugars well controlled

## 2019-08-18 NOTE — Assessment & Plan Note (Signed)
Have not taking statin for a few weeks  Cholesterol still looked good, but needs statin for CAD Will restart once medication is available from pharmacy

## 2019-08-18 NOTE — Assessment & Plan Note (Signed)
Drinks fluids, but does not drink any water No other concerning symptoms Likely from mild dehydration Encouraged increased water intake

## 2019-08-27 ENCOUNTER — Telehealth: Payer: Self-pay | Admitting: Internal Medicine

## 2019-08-27 NOTE — Telephone Encounter (Signed)
Appointment was scheduled in correctly.  Called patient and left a message for her to call back to reschedule. Should be scheduled on 09/07/2019 or after as: Visit Note: Prolia ($90 copay - pt aware she will be billed - okay to give per [place])/ flu shot  Visit Type: Nurse Provider: Nurse

## 2019-08-27 NOTE — Telephone Encounter (Signed)
Patient returned call and [facility] to 09/09/2019 with the nurse.

## 2019-08-27 NOTE — Telephone Encounter (Signed)
Insurance has been submitted and verified for Prolia. Patient is responsible for a $90 copay but will be billed for this.  Due 09/07/2019 or after. Left message for patient to call back to schedule.  Okay to schedule... Visit Note: Prolia ($90 copay - pt aware she will be billed - okay to give per Gareth Eagle) Visit Type: Nurse Provider: Nurse

## 2019-09-03 ENCOUNTER — Ambulatory Visit: Payer: Medicare Other

## 2019-09-09 ENCOUNTER — Ambulatory Visit: Payer: Medicare Other

## 2019-09-11 ENCOUNTER — Ambulatory Visit (INDEPENDENT_AMBULATORY_CARE_PROVIDER_SITE_OTHER): Payer: Medicare Other

## 2019-09-11 ENCOUNTER — Ambulatory Visit: Payer: Medicare Other

## 2019-09-11 ENCOUNTER — Other Ambulatory Visit: Payer: Self-pay

## 2019-09-11 ENCOUNTER — Ambulatory Visit (INDEPENDENT_AMBULATORY_CARE_PROVIDER_SITE_OTHER)
Admission: RE | Admit: 2019-09-11 | Discharge: 2019-09-11 | Disposition: A | Discharge status: Home / Self Care | Payer: Medicare Other | Source: Ambulatory Visit | Attending: Internal Medicine | Admitting: Internal Medicine

## 2019-09-11 DIAGNOSIS — Z23 Encounter for immunization: Secondary | ICD-10-CM | POA: Diagnosis not present

## 2019-09-11 DIAGNOSIS — M810 Age-related osteoporosis without current pathological fracture: Secondary | ICD-10-CM

## 2019-09-11 MED ORDER — DENOSUMAB 60 MG/ML ~~LOC~~ SOSY
60.0000 mg | PREFILLED_SYRINGE | Freq: Once | SUBCUTANEOUS | Status: AC
  Administered 2019-09-11: 15:00:00 60 mg via SUBCUTANEOUS

## 2019-09-11 NOTE — Progress Notes (Signed)
prolia and flu injections given  Binnie Rail, MD

## 2019-09-16 ENCOUNTER — Encounter: Payer: Self-pay | Admitting: Internal Medicine

## 2019-10-12 ENCOUNTER — Other Ambulatory Visit: Payer: Self-pay

## 2019-10-12 DIAGNOSIS — Z20822 Contact with and (suspected) exposure to covid-19: Secondary | ICD-10-CM

## 2019-10-13 LAB — NOVEL CORONAVIRUS, NAA: SARS-CoV-2, NAA: NOT DETECTED

## 2019-10-27 ENCOUNTER — Telehealth: Payer: Self-pay | Admitting: Internal Medicine

## 2019-10-27 MED ORDER — ATORVASTATIN CALCIUM 40 MG PO TABS: 40.0000 mg | ORAL_TABLET | Freq: Every day | ORAL | 1 refills | Status: DC

## 2019-10-27 MED ORDER — ATORVASTATIN CALCIUM 20 MG PO TABS: 40.0000 mg | ORAL_TABLET | Freq: Every day | ORAL | 1 refills | Status: DC

## 2019-10-27 NOTE — Addendum Note (Signed)
Addended by: Fidel Levy on: 10/27/2019 09:26 AM   Modules accepted: Orders

## 2019-10-27 NOTE — Telephone Encounter (Signed)
Patient requested PA be completed for atorvastatin 20mg  - take 1 tablet PO BID She cannot take 40mg  tablets - too large She cannot cut 40mg  tablets in half and take this way - irritates throat which is already irritated by reflux  PA submitted on [email or web address] (Key: [id number])

## 2019-10-28 MED ORDER — ATORVASTATIN CALCIUM 20 MG PO TABS: 20.0000 mg | ORAL_TABLET | Freq: Two times a day (BID) | ORAL | 1 refills | Status: DC

## 2019-10-28 NOTE — Addendum Note (Signed)
Addended by: Fidel Levy on: 10/28/2019 01:30 PM   Modules accepted: Orders

## 2019-10-28 NOTE — Telephone Encounter (Signed)
Approved today  Request Reference Number: [id number]. ATORVASTATIN TAB 20MG  is approved through 12/30/2020.

## 2019-11-16 ENCOUNTER — Encounter (HOSPITAL_COMMUNITY): Payer: Self-pay | Admitting: Emergency Medicine

## 2019-11-16 ENCOUNTER — Emergency Department (HOSPITAL_COMMUNITY)
Admission: EM | Admit: 2019-11-16 | Discharge: 2019-11-17 | Disposition: A | Discharge status: Home / Self Care | Payer: Medicare Other | Attending: Emergency Medicine | Admitting: Emergency Medicine

## 2019-11-16 ENCOUNTER — Emergency Department (HOSPITAL_COMMUNITY): Payer: Medicare Other

## 2019-11-16 ENCOUNTER — Other Ambulatory Visit: Payer: Self-pay

## 2019-11-16 DIAGNOSIS — Y92511 Restaurant or cafe as the place of occurrence of the external cause: Secondary | ICD-10-CM | POA: Insufficient documentation

## 2019-11-16 DIAGNOSIS — J449 Chronic obstructive pulmonary disease, unspecified: Secondary | ICD-10-CM | POA: Insufficient documentation

## 2019-11-16 DIAGNOSIS — Z79899 Other long term (current) drug therapy: Secondary | ICD-10-CM | POA: Diagnosis not present

## 2019-11-16 DIAGNOSIS — Y999 Unspecified external cause status: Secondary | ICD-10-CM | POA: Diagnosis not present

## 2019-11-16 DIAGNOSIS — Y939 Activity, unspecified: Secondary | ICD-10-CM | POA: Diagnosis not present

## 2019-11-16 DIAGNOSIS — Z87891 Personal history of nicotine dependence: Secondary | ICD-10-CM | POA: Insufficient documentation

## 2019-11-16 DIAGNOSIS — S0083XA Contusion of other part of head, initial encounter: Secondary | ICD-10-CM | POA: Insufficient documentation

## 2019-11-16 DIAGNOSIS — W010XXA Fall on same level from slipping, tripping and stumbling without subsequent striking against object, initial encounter: Secondary | ICD-10-CM | POA: Diagnosis not present

## 2019-11-16 DIAGNOSIS — S299XXA Unspecified injury of thorax, initial encounter: Secondary | ICD-10-CM | POA: Diagnosis present

## 2019-11-16 DIAGNOSIS — I10 Essential (primary) hypertension: Secondary | ICD-10-CM | POA: Insufficient documentation

## 2019-11-16 DIAGNOSIS — Z7982 Long term (current) use of aspirin: Secondary | ICD-10-CM | POA: Insufficient documentation

## 2019-11-16 DIAGNOSIS — R519 Headache, unspecified: Secondary | ICD-10-CM | POA: Insufficient documentation

## 2019-11-16 DIAGNOSIS — S20212A Contusion of left front wall of thorax, initial encounter: Secondary | ICD-10-CM | POA: Diagnosis not present

## 2019-11-16 LAB — BASIC METABOLIC PANEL
Anion gap: 11 (ref 5–15)
BUN: 20 mg/dL (ref 8–23)
CO2: 25 mmol/L (ref 22–32)
Calcium: 9.2 mg/dL (ref 8.9–10.3)
Chloride: 104 mmol/L (ref 98–111)
Creatinine, Ser: 0.94 mg/dL (ref 0.44–1.00)
GFR calc Af Amer: >60 mL/min (ref >60)
GFR calc non Af Amer: 58 mL/min — ABNORMAL LOW (ref >60)
Glucose, Bld: 101 mg/dL — ABNORMAL HIGH (ref 70–99)
Potassium: 4 mmol/L (ref 3.5–5.1)
Sodium: 140 mmol/L (ref 135–145)

## 2019-11-16 LAB — CBC
HCT: 41.6 % (ref 36.0–46.0)
Hemoglobin: 13.4 g/dL (ref 12.0–15.0)
MCH: 32 pg (ref 26.0–34.0)
MCHC: 32.2 g/dL (ref 30.0–36.0)
MCV: 99.3 fL (ref 80.0–100.0)
Platelets: 273 10*3/uL (ref 150–400)
RBC: 4.19 MIL/uL (ref 3.87–5.11)
RDW: 12.4 % (ref 11.5–15.5)
WBC: 7.9 10*3/uL (ref 4.0–10.5)
nRBC: 0 % (ref 0.0–0.2)

## 2019-11-16 LAB — TROPONIN I (HIGH SENSITIVITY): Troponin I (High Sensitivity): 3 ng/L (ref <18)

## 2019-11-16 MED ORDER — CYCLOBENZAPRINE HCL 5 MG PO TABS: 5.0000 mg | ORAL_TABLET | Freq: Three times a day (TID) | ORAL | 0 refills | Status: DC | PRN

## 2019-11-16 MED ORDER — CYCLOBENZAPRINE HCL 10 MG PO TABS: 5.0000 mg | ORAL_TABLET | Freq: Once | ORAL | Status: DC

## 2019-11-16 MED ORDER — SODIUM CHLORIDE 0.9% FLUSH: 3.0000 mL | Freq: Once | INTRAVENOUS | Status: DC

## 2019-11-16 NOTE — ED Notes (Addendum)
Pt's husband Havanah Patla can be reached at [phone] when pt goes to a room.

## 2019-11-16 NOTE — ED Provider Notes (Signed)
[place] EMERGENCY DEPARTMENT Provider Note   CSN: [id number] Arrival date & time: 11/16/19  1328     History   Chief Complaint Chief Complaint  Patient presents with  . Fall    HPI Regina Ramsey is a 78 y.o. female hx of COPD, HTN, HL, here presenting with fall.  Patient states that she was in the restaurant and turned around and fell face forward and hit her head and her chest.  She states that she had headache as well as left elbow pain and chest pain after the fall.  She did not pass out or lose consciousness.      The history is provided by the patient.    Past Medical History:  Diagnosis Date  . ALLERGIC RHINITIS   . ANEMIA-NOS   . Anxiety   . Asthma    Triggered by allergies  . COPD    normal PFTs 06/22/11  . Diverticulosis of colon   . Dyspareunia   . Endometriosis   . GERD   . HEARING LOSS 07/13/2010  . HYPERLIPIDEMIA   . HYPERTENSION   . Irritable bowel syndrome   . MIGRAINE HEADACHE   . OSTEOPOROSIS    on Prolia q 34mo hx vertebral fx  . PALPITATIONS, HX OF   . Vaginal atrophy     Patient Active Problem List   Diagnosis Date Noted  . Dark urine 08/18/2019  . Spondylolisthesis, lumbar region 10/25/2018  . Plantar fasciitis 10/14/2018  . Right hip pain 10/14/2018  . Coronary artery calcification 10/01/2018  . B12 deficiency 05/08/2018  . IBS (irritable bowel syndrome) 06/03/2017  . Low back pain 10/11/2016  . Shortness of breath 05/31/2016  . LBBB (left bundle branch block) 05/31/2016  . Prediabetes 04/15/2016  . Palpitations 04/11/2016  . History of colonic polyps 03/07/2015  . Vaginal atrophy 07/23/2014  . Dyspareunia 07/23/2014  . Hx of endometriosis 06/30/2014  . Diverticulosis of colon 06/30/2014  . Osteoporosis 08/16/2010  . Hyperlipidemia 07/13/2010  . ANEMIA-NOS 07/13/2010  . HEARING LOSS 07/13/2010  . Essential hypertension 07/13/2010  . GERD 07/13/2010    Past Surgical History:  Procedure Laterality  Date  . ABDOMINAL HYSTERECTOMY  1974  . APPENDECTOMY  1958  . BREAST SURGERY  1985   breast biopsy  . LEFT HEART CATH AND CORONARY ANGIOGRAPHY N/A 12/03/2018   Procedure: LEFT HEART CATH AND CORONARY ANGIOGRAPHY;  Surgeon: KTroy Sine MD;  Location: [facility];  Service: Cardiovascular;  Laterality: N/A;     OB History    Gravida  2   Para  2   Term      Preterm      AB      Living  2     SAB      TAB      Ectopic      Multiple      Live Births               Home Medications    Prior to Admission medications   Medication Sig Start Date End Date Taking? Authorizing Provider  acetaminophen (TYLENOL) 500 MG tablet Take 500 mg by mouth every 8 (eight) hours as needed (pain).    [provider]  aspirin EC 81 MG tablet Take 81 mg by mouth daily.    [provider]  atorvastatin (LIPITOR) 20 MG tablet Take 1 tablet (20 mg total) by mouth 2 (two) times daily. 10/28/19   Hilty, KNadean Corwin  MD  calcium carbonate (TUMS - DOSED IN MG ELEMENTAL CALCIUM) 500 MG chewable tablet Chew 1 tablet by mouth daily.    [provider]  lisinopril (ZESTRIL) 5 MG tablet Take 1 tablet (5 mg total) by mouth daily. 07/29/19   Hilty, Nadean Corwin, MD  metoprolol succinate (TOPROL-XL) 25 MG 24 hr tablet Take 1 tablet (25 mg total) by mouth 2 (two) times a day. Take with or immediately following a meal. 06/19/19   Hilty, Nadean Corwin, MD    Family History Family History  Adopted: Yes  Problem Relation Age of Onset  . Arthritis Mother   . Breast cancer Mother   . Cancer Mother        started on her spine met cancer  . Bone cancer Mother   . Arthritis Father   . Breast cancer Maternal Aunt   . Diabetes Other        parent & grandparent  . Hyperlipidemia Maternal Grandmother   . Bradycardia Son   . Heart Problems Daughter        enlarged heart  . Cancer - Prostate Brother   . Colon cancer Neg Hx   . Colon polyps Neg Hx     Social History Social  History   Tobacco Use  . Smoking status: Former Smoker    Types: Cigarettes    Quit date: 01/01/1964    Years since quitting: 55.9  . Smokeless tobacco: Never Used  . Tobacco comment: Married, lives with souse. Master of education-specialist needs for grade school  Substance Use Topics  . Alcohol use: Yes    Alcohol/week: 0.0 standard drinks    Comment: Social  . Drug use: No     Allergies   Patient has no known allergies.   Review of Systems Review of Systems  Musculoskeletal:       Chest wall pain   All other systems reviewed and are negative.    Physical Exam Updated Vital Signs BP (!) 165/60 (BP Location: Right Arm)   Pulse 73   Temp 97.8 F (36.6 C) (Oral)   Resp 18   SpO2 97%   Physical Exam Vitals signs and nursing note reviewed.  Constitutional:      Appearance: Normal appearance.  HENT:     Head: Normocephalic.     Comments: Frontal contusion, no obvious laceration     Nose: Nose normal.     Mouth/Throat:     Mouth: Mucous membranes are moist.  Eyes:     Extraocular Movements: Extraocular movements intact.     Pupils: Pupils are equal, round, and reactive to light.  Neck:     Musculoskeletal: Normal range of motion.  Cardiovascular:     Rate and Rhythm: Normal rate and regular rhythm.     Pulses: Normal pulses.     Heart sounds: Normal heart sounds.     Comments: Mild lower chest wall/ sternal tenderness, no ecchymosis  Pulmonary:     Effort: Pulmonary effort is normal.     Breath sounds: Normal breath sounds.  Abdominal:     General: Abdomen is flat.     Palpations: Abdomen is soft.     Comments: Minimal epigastric tenderness, no bruising or ecchymosis   Musculoskeletal: Normal range of motion.     Comments: Abrasion l elbow, nl ROM. No obvious extremity trauma   Skin:    General: Skin is warm.     Capillary Refill: Capillary refill takes less than 2 seconds.  Neurological:  General: No focal deficit present.     Mental Status: She is  alert and oriented to person, place, and time.  Psychiatric:        Mood and Affect: Mood normal.        Behavior: Behavior normal.      ED Treatments / Results  Labs (all labs ordered are listed, but only abnormal results are displayed) Labs Reviewed  BASIC METABOLIC PANEL - Abnormal; Notable for the following components:      Result Value   Glucose, Bld 101 (*)    GFR calc non Af Amer 58 (*)    All other components within normal limits  CBC  TROPONIN I (HIGH SENSITIVITY)  TROPONIN I (HIGH SENSITIVITY)    EKG EKG Interpretation  Date/Time:  Monday November 16 2019 13:30:51 EST Ventricular Rate:  71 PR Interval:  154 QRS Duration: 120 QT Interval:  398 QTC Calculation: 432 R Axis:   10 Text Interpretation: Normal sinus rhythm Septal infarct , age undetermined Abnormal ECG No significant change since last tracing Confirmed by Wandra Arthurs ([id number]) on 11/16/2019 6:47:34 PM   Radiology Dg Chest 2 View  Result Date: 11/16/2019 CLINICAL DATA:  [place] today with chest pain. EXAM: CHEST - 2 VIEW COMPARISON:  05/08/2018 FINDINGS: Heart size is normal. Chronic aortic atherosclerosis. Small hiatal hernia is noted. The lungs are clear. The vascularity is normal. Old compression deformity of T12 or L1. No acute bone finding. IMPRESSION: No active disease. Aortic atherosclerosis. Small hiatal hernia. Old T12 or L1 compression deformity. Electronically Signed   By: Nelson Chimes M.D.   On: 11/16/2019 15:38   Ct Head Wo Contrast  Result Date: 11/16/2019 CLINICAL DATA:  Posttraumatic headache after fall. EXAM: CT HEAD WITHOUT CONTRAST TECHNIQUE: Contiguous axial images were obtained from the base of the skull through the vertex without intravenous contrast. COMPARISON:  None. FINDINGS: Brain: No evidence of acute infarction, hemorrhage, hydrocephalus, extra-axial collection or mass lesion/mass effect. Vascular: No hyperdense vessel or unexpected calcification. Skull: Normal. Negative for  fracture or focal lesion. Sinuses/Orbits: No acute finding. Other: None. IMPRESSION: Normal head CT. Electronically Signed   By: Marijo Conception M.D.   On: 11/16/2019 15:24    Procedures Procedures (including critical care time)  Medications Ordered in ED Medications  sodium chloride flush (NS) 0.9 % injection 3 mL (has no administration in time range)     Initial Impression / Assessment and Plan / ED Course  I have reviewed the triage vital signs and the nursing notes.  Pertinent labs & imaging results that were available during my care of the patient were reviewed by me and considered in my medical decision making (see chart for details).       Isolde Skaff Gargis is a 78 y.o. female here with fall. [place] and hit her head. Patient had chest pain after the fall and has mild sternal tenderness. Patient's labs and CXR and CT head unremarkable. Not on blood thinners. Minimal epigastric tenderness likely referred pain from fall. I think likely has chest wall contusion and I doubt ACS. Will dc home with flexeril prn. Gave strict return precautions    Final Clinical Impressions(s) / ED Diagnoses   Final diagnoses:  None    ED Discharge Orders    None       Drenda Freeze, MD 11/16/19 [facility]

## 2019-11-16 NOTE — ED Triage Notes (Signed)
Pt went to a restaurant with her husband, and tripped over his feet. Pt fell and hit her head on the floor. Did not lose consciousness. Pt only takes ASA., no blood thinners. HR 70s. Pt is complains of some CP at this time. Worsens with deep inspiration. Denies any neck pain.

## 2019-11-16 NOTE — Discharge Instructions (Signed)
Your CT scan of your head and xrays are normal. You likely has bruised some muscles on your chest.   Take flexeril for muscle spasms. Take tylenol and motrin for pain   See your doctor  Return to ER if you have worse chest wall pain, shortness of breath, headaches, vomiting

## 2019-11-18 ENCOUNTER — Other Ambulatory Visit: Payer: Self-pay

## 2019-11-18 DIAGNOSIS — Z20822 Contact with and (suspected) exposure to covid-19: Secondary | ICD-10-CM

## 2019-11-19 NOTE — Progress Notes (Signed)
Subjective:    Patient ID: Regina Ramsey, female    DOB: 09-May-1941, 78 y.o.   MRN: [id number]  HPI The patient is here for follow up from the ED.   ED 11/16/19 after a fall.   She was in a restaurant, turned around and fell face forward hitting her head and chest.  There was no LOC.  She complained of a headache, left elbow pain and chest pain since the fall.  She had a contusion on her frontal head.  She had mild lower chest wall/sternal tenderness, mild epigastric tenderness w/o bruising, abrasion on L elbow.   EKG w/o acute changes.  CXR and CT of head unremarkable.  Epigastric tenderness thought to be referred pain from fall.  She was discharged home with flexeril.  She did not try the Flexeril and has only been taking Tylenol for her pain.  Chest pain:  She is still having chest pain.  The pain is from the center lower chest around her right side toward her back.  Taking a deep breath increases the pain.   She has constant pain.  There has been minimal improvement.  She has been taking tylenol.  She denies any fevers, chills, cough or wheeze.  Left elbow pain:  She landed on her left elbow.  There was an abrasion.  She has bruising throughout her elbow.  The entire elbow hurts. She has pain with flexion/extension. She denies numbness/tingling in the arm.  Her elbow is slightly swollen.     Medications and allergies reviewed with patient and updated if appropriate.  Patient Active Problem List   Diagnosis Date Noted  . Dark urine 08/18/2019  . Spondylolisthesis, lumbar region 10/25/2018  . Plantar fasciitis 10/14/2018  . Right hip pain 10/14/2018  . Coronary artery calcification 10/01/2018  . B12 deficiency 05/08/2018  . IBS (irritable bowel syndrome) 06/03/2017  . Low back pain 10/11/2016  . Shortness of breath 05/31/2016  . LBBB (left bundle branch block) 05/31/2016  . Prediabetes 04/15/2016  . Palpitations 04/11/2016  . History of colonic polyps 03/07/2015  .  Vaginal atrophy 07/23/2014  . Dyspareunia 07/23/2014  . Hx of endometriosis 06/30/2014  . Diverticulosis of colon 06/30/2014  . Osteoporosis 08/16/2010  . Hyperlipidemia 07/13/2010  . ANEMIA-NOS 07/13/2010  . HEARING LOSS 07/13/2010  . Essential hypertension 07/13/2010  . GERD 07/13/2010    Current Outpatient Medications on File Prior to Visit  Medication Sig Dispense Refill  . acetaminophen (TYLENOL) 500 MG tablet Take 500 mg by mouth every 8 (eight) hours as needed (pain).    Marland Kitchen aspirin EC 81 MG tablet Take 81 mg by mouth daily.    Marland Kitchen atorvastatin (LIPITOR) 20 MG tablet Take 1 tablet (20 mg total) by mouth 2 (two) times daily. 180 tablet 1  . calcium carbonate (TUMS - DOSED IN MG ELEMENTAL CALCIUM) 500 MG chewable tablet Chew 1 tablet by mouth daily.    . cyclobenzaprine (FLEXERIL) 5 MG tablet Take 1 tablet (5 mg total) by mouth 3 (three) times daily as needed for muscle spasms. 10 tablet 0  . lisinopril (ZESTRIL) 2.5 MG tablet Take 5 mg by mouth daily.    . metoprolol succinate (TOPROL-XL) 25 MG 24 hr tablet Take 1 tablet (25 mg total) by mouth 2 (two) times a day. Take with or immediately following a meal. 180 tablet 1   No current facility-administered medications on file prior to visit.     Past Medical History:  Diagnosis Date  .  ALLERGIC RHINITIS   . ANEMIA-NOS   . Anxiety   . Asthma    Triggered by allergies  . COPD    normal PFTs 06/22/11  . Diverticulosis of colon   . Dyspareunia   . Endometriosis   . GERD   . HEARING LOSS 07/13/2010  . HYPERLIPIDEMIA   . HYPERTENSION   . Irritable bowel syndrome   . MIGRAINE HEADACHE   . OSTEOPOROSIS    on Prolia q 64mo hx vertebral fx  . PALPITATIONS, HX OF   . Vaginal atrophy     Past Surgical History:  Procedure Laterality Date  . ABDOMINAL HYSTERECTOMY  1974  . APPENDECTOMY  1958  . BREAST SURGERY  1985   breast biopsy  . LEFT HEART CATH AND CORONARY ANGIOGRAPHY N/A 12/03/2018   Procedure: LEFT HEART CATH AND  CORONARY ANGIOGRAPHY;  Surgeon: KTroy Sine MD;  Location: [facility];  Service: Cardiovascular;  Laterality: N/A;    Social History   Socioeconomic History  . Marital status: Married    Spouse name: Not on file  . Number of children: 2  . Years of education: Not on file  . Highest education level: Not on file  Occupational History  . Occupation: Retired  SScientific laboratory technician . Financial resource strain: Not on file  . Food insecurity    Worry: Not on file    Inability: Not on file  . Transportation needs    Medical: Not on file    Non-medical: Not on file  Tobacco Use  . Smoking status: Former Smoker    Types: Cigarettes    Quit date: 01/01/1964    Years since quitting: 55.9  . Smokeless tobacco: Never Used  . Tobacco comment: Married, lives with souse. Master of education-specialist needs for grade school  Substance and Sexual Activity  . Alcohol use: Yes    Alcohol/week: 0.0 standard drinks    Comment: Social  . Drug use: No  . Sexual activity: Not on file  Lifestyle  . Physical activity    Days per week: Not on file    Minutes per session: Not on file  . Stress: Not on file  Relationships  . Social [place] phone: Not on file    Gets together: Not on file    Attends religious service: Not on file    Active member of club or organization: Not on file    Attends meetings of clubs or organizations: Not on file    Relationship status: Not on file  Other Topics Concern  . Not on file  Social History Narrative   Retired tPharmacist, hospital     No regular exercise, active      epworth sleepiness scale = 10 (05/30/16)    Family History  Adopted: Yes  Problem Relation Age of Onset  . Arthritis Mother   . Breast cancer Mother   . Cancer Mother        started on her spine met cancer  . Bone cancer Mother   . Arthritis Father   . Breast cancer Maternal Aunt   . Diabetes Other        parent & grandparent  . Hyperlipidemia Maternal Grandmother   .  Bradycardia Son   . Heart Problems Daughter        enlarged heart  . Cancer - Prostate Brother   . Colon cancer Neg Hx   . Colon polyps Neg Hx     Review  of Systems  Constitutional: Negative for chills and fever.  Respiratory: Positive for shortness of breath (can't breath deeply due to pain). Negative for cough and wheezing.   Cardiovascular: Positive for chest pain.       Objective:   Vitals:   11/20/19 0903  BP: 140/80  Pulse: 77  Temp: 98.1 F (36.7 C)  SpO2: 95%   BP Readings from Last 3 Encounters:  11/20/19 140/80  11/16/19 (!) 165/60  08/18/19 130/64   Wt Readings from Last 3 Encounters:  11/20/19 134 lb (60.8 kg)  08/18/19 135 lb 6.4 oz (61.4 kg)  07/14/19 132 lb (59.9 kg)   Body mass index is 28.01 kg/m.   Physical Exam    Constitutional: Appears well-developed and well-nourished. No distress.  HENT:  Head: Normocephalic and atraumatic.  Cardiovascular: Normal rate, regular rhythm and normal heart sounds.  No murmur heard.  No edema Pulmonary/Chest: Right side of sternum and right lower half of ribs tender to palpation.  Increased discomfort with deep breaths.  Effort normal and breath sounds normal. No respiratory distress. No has no wheezes. No rales. Musculoskeletal: Left elbow with healing abrasion-mild scabbing, bruising throughout elbow.  Full range of motion with some discomfort.  Tenderness with palpation of olecranon.  Minimal swelling throughout joint Skin: Skin is warm and dry. Not diaphoretic.  Psychiatric: Normal mood and affect. Behavior is normal.      Assessment & Plan:    See Problem List for Assessment and Plan of chronic medical problems.     This visit occurred during the SARS-CoV-2 public health emergency.  Safety protocols were in place, including screening questions prior to the visit, additional usage of staff PPE, and extensive cleaning of exam room while observing appropriate contact time as indicated for disinfecting  solutions.

## 2019-11-20 ENCOUNTER — Ambulatory Visit (INDEPENDENT_AMBULATORY_CARE_PROVIDER_SITE_OTHER): Payer: Medicare Other | Admitting: Internal Medicine

## 2019-11-20 ENCOUNTER — Ambulatory Visit (INDEPENDENT_AMBULATORY_CARE_PROVIDER_SITE_OTHER)
Admission: RE | Admit: 2019-11-20 | Discharge: 2019-11-20 | Disposition: A | Discharge status: Home / Self Care | Payer: Medicare Other | Source: Ambulatory Visit | Attending: Internal Medicine | Admitting: Internal Medicine

## 2019-11-20 ENCOUNTER — Encounter: Payer: Self-pay | Admitting: Internal Medicine

## 2019-11-20 ENCOUNTER — Other Ambulatory Visit: Payer: Self-pay

## 2019-11-20 VITALS — BP 140/80 | HR 77 | Temp 98.1°F | Ht <= 58 in | Wt 134.0 lb

## 2019-11-20 DIAGNOSIS — R0781 Pleurodynia: Secondary | ICD-10-CM | POA: Insufficient documentation

## 2019-11-20 DIAGNOSIS — M25522 Pain in left elbow: Secondary | ICD-10-CM

## 2019-11-20 LAB — NOVEL CORONAVIRUS, NAA: SARS-CoV-2, NAA: NOT DETECTED

## 2019-11-20 NOTE — Patient Instructions (Signed)
Have x-rays today.  We will call you with the results.   Continue taking tylenol as needed.

## 2019-11-20 NOTE — Assessment & Plan Note (Signed)
Pain in right side of ribs from sternum to back As a result of the fall Chest x-ray was normal in the ED on 11/16, but she is seen minimal improvement We will go ahead and get an x-ray of her ribs She deferred anything stronger than Tylenol for the pain-advised to take this regularly Depending on x-ray results and improvement of pain can refer to sports medicine

## 2019-11-20 NOTE — Assessment & Plan Note (Signed)
Left elbow pain, swelling, bruising after fall  11/16 No imaging was done in the emergency room so we will go ahead and get an x-ray today Continue Tylenol for pain-she does not want anything stronger Further management based on x-ray results

## 2020-01-04 ENCOUNTER — Other Ambulatory Visit: Payer: Self-pay | Admitting: Internal Medicine

## 2020-01-29 ENCOUNTER — Other Ambulatory Visit: Payer: Self-pay | Admitting: Internal Medicine

## 2020-02-02 ENCOUNTER — Other Ambulatory Visit: Payer: Self-pay | Admitting: Physician Assistant

## 2020-02-03 ENCOUNTER — Telehealth: Payer: Self-pay

## 2020-02-03 MED ORDER — LISINOPRIL 5 MG PO TABS: 5.0000 mg | ORAL_TABLET | Freq: Every day | ORAL | 3 refills | Status: DC

## 2020-02-03 NOTE — Telephone Encounter (Signed)
Patient has always taken lisinopril  2.5 mg - 2 tablets daily.  Has difficulty swallowing pills and prefers smallest sizes.  Newest refill was sent for 2.5 mg 1 tablet daily.    Explained that not much difference in tablet size between 2.5 and 5 mg tablets, so we will send rx for the 5 mg tablets to avoid the confusion in the future.  Patient agreeable to plan

## 2020-02-03 NOTE — Telephone Encounter (Signed)
Pt called stated that she had a medication question for a pharmacist will route to pharmd pool. Pt didn't specify which med or what concern on the voicemail.

## 2020-02-13 ENCOUNTER — Ambulatory Visit: Payer: Medicare PPO | Attending: Internal Medicine

## 2020-02-13 DIAGNOSIS — Z23 Encounter for immunization: Secondary | ICD-10-CM | POA: Insufficient documentation

## 2020-02-13 NOTE — Progress Notes (Signed)
   Covid-19 Vaccination Clinic  Name:  Regina Ramsey    MRN: [id number] DOB: 07-Oct-1941  02/13/2020  Regina Ramsey was observed post Covid-19 immunization for 30 minutes based on pre-vaccination screening without incidence. She was provided with Vaccine Information Sheet and instruction to access the V-Safe system.   Regina Ramsey was instructed to call 911 with any severe reactions post vaccine: Marland Kitchen Difficulty breathing  . Swelling of your face and throat  . A fast heartbeat  . A bad rash all over your body  . Dizziness and weakness    Immunizations Administered    Name Date Dose VIS Date Route   Pfizer COVID-19 Vaccine 02/13/2020 10:06 AM 0.3 mL 12/11/2019 Intramuscular   Manufacturer: [place]   Lot: [id number]   [place]: [id number]

## 2020-03-06 ENCOUNTER — Ambulatory Visit: Payer: Medicare PPO | Attending: Internal Medicine

## 2020-03-06 DIAGNOSIS — Z23 Encounter for immunization: Secondary | ICD-10-CM

## 2020-03-06 NOTE — Progress Notes (Signed)
   Covid-19 Vaccination Clinic  Name:  JEARLDEAN HODES    MRN: [id number] DOB: April 16, 1941  03/06/2020  Ms. Ebey was observed post Covid-19 immunization for 15 minutes without incident. She was provided with Vaccine Information Sheet and instruction to access the V-Safe system.   Ms. Seegert was instructed to call 911 with any severe reactions post vaccine: Marland Kitchen Difficulty breathing  . Swelling of face and throat  . A fast heartbeat  . A bad rash all over body  . Dizziness and weakness   Immunizations Administered    Name Date Dose VIS Date Route   Pfizer COVID-19 Vaccine 03/06/2020  2:57 PM 0.3 mL 12/11/2019 Intramuscular   Manufacturer: [place]   Lot: [id number]   [place]: [id number]

## 2020-03-17 DIAGNOSIS — M25512 Pain in left shoulder: Secondary | ICD-10-CM | POA: Diagnosis not present

## 2020-03-17 DIAGNOSIS — M7542 Impingement syndrome of left shoulder: Secondary | ICD-10-CM | POA: Diagnosis not present

## 2020-03-17 DIAGNOSIS — M25522 Pain in left elbow: Secondary | ICD-10-CM | POA: Diagnosis not present

## 2020-03-22 ENCOUNTER — Encounter: Payer: Self-pay | Admitting: Internal Medicine

## 2020-03-22 ENCOUNTER — Telehealth (INDEPENDENT_AMBULATORY_CARE_PROVIDER_SITE_OTHER): Payer: Medicare PPO | Admitting: Internal Medicine

## 2020-03-22 VITALS — BP 143/92 | HR 72 | Temp 98.3°F | Ht <= 58 in | Wt 135.0 lb

## 2020-03-22 DIAGNOSIS — Z7189 Other specified counseling: Secondary | ICD-10-CM

## 2020-03-22 DIAGNOSIS — I2510 Atherosclerotic heart disease of native coronary artery without angina pectoris: Secondary | ICD-10-CM

## 2020-03-22 DIAGNOSIS — I447 Left bundle-branch block, unspecified: Secondary | ICD-10-CM | POA: Diagnosis not present

## 2020-03-22 DIAGNOSIS — I2584 Coronary atherosclerosis due to calcified coronary lesion: Secondary | ICD-10-CM | POA: Diagnosis not present

## 2020-03-22 DIAGNOSIS — E782 Mixed hyperlipidemia: Secondary | ICD-10-CM | POA: Diagnosis not present

## 2020-03-22 DIAGNOSIS — M25551 Pain in right hip: Secondary | ICD-10-CM | POA: Diagnosis not present

## 2020-03-22 DIAGNOSIS — I10 Essential (primary) hypertension: Secondary | ICD-10-CM

## 2020-03-22 DIAGNOSIS — R002 Palpitations: Secondary | ICD-10-CM

## 2020-03-22 NOTE — Patient Instructions (Signed)
Medication Instructions:  Your Physician recommend you continue on your current medication as directed.    *If you need a refill on your cardiac medications before your next appointment, please call your pharmacy*   Lab Work: None  Testing/Procedures: None   Follow-Up: At [facility], you and your health needs are our priority.  As part of our continuing mission to provide you with exceptional heart care, we have created designated Provider Care Teams.  These Care Teams include your primary Cardiologist (physician) and Advanced Practice Providers (APPs -  Physician Assistants and Nurse Practitioners) who all work together to provide you with the care you need, when you need it.  We recommend signing up for the patient portal called "MyChart".  Sign up information is provided on this After Visit Summary.  MyChart is used to connect with patients for Virtual Visits (Telemedicine).  Patients are able to view lab/test results, encounter notes, upcoming appointments, etc.  Non-urgent messages can be sent to your provider as well.   To learn more about what you can do with MyChart, go to [email or web address].    Your next appointment:   1 year(s)  The format for your next appointment:   In Person  Provider:   K. Chad Hilty, MD                                                                                                                                                                                                                                                                                                                                                                                                                                                                                                                                                                                                                                                                                                                                                                                                                                                                                                                                                                                                                                                                                                                      

## 2020-03-22 NOTE — Progress Notes (Signed)
Virtual Visit via Video Note   This visit type was conducted due to national recommendations for restrictions regarding the COVID-19 Pandemic (e.g. social distancing) in an effort to limit this patient's exposure and mitigate transmission in our community.  Due to her co-morbid illnesses, this patient is at least at moderate risk for complications without adequate follow up.  This format is felt to be most appropriate for this patient at this time.  All issues noted in this document were discussed and addressed.  A limited physical exam was performed with this format.  Please refer to the patient's chart for her consent to telehealth for [facility].   Evaluation Performed:  Telephone follow-up  Date:  03/22/2020   ID:  Regina, Ramsey 15-Jun-1941, MRN [id number]  Patient Location:  [place] [address]  Provider location:   [address] [place], [place] [address]  PCP:  Binnie Rail, MD  Cardiologist:  No primary care provider on file. Electrophysiologist:  None   Chief Complaint:  No complaints  History of Present Illness:    Regina Ramsey is a 79 y.o. female who presents via audio/video conferencing for a telehealth visit today.  Regina Ramsey returns today for follow-up.  She was seen by video visit.  She has no new complaints.  In November we performed cardiac CT for symptoms concerning for angina.  She was noted to have a high coronary artery calcium score of 500 with multivessel calcification.  The study was then sent for CT FFR to rule out high-grade proximal disease particularly in the LAD.  The FFR was not significant other than the distal LAD however a definitive left heart catheterization was performed which actually corroborated those findings.  There was no significant obstructive disease, small intramyocardial segment of the LAD was noted, but otherwise mild nonobstructive coronary disease with calcification.  Aggressive medical  therapy was recommended.  Her blood pressure had remained elevated but after working with the hypertension clinic now is better controlled.  She also had a lipid profile in January with total cholesterol 145, triglycerides 104, HDL 41 and LDL of 83.  At the time her a atorvastatin was then increased to 40 mg daily to target LDL less than 70.  She is also been working on weight loss and more regular exercise.  03/22/20  [name] is without complaints today.  She denies any chest pain or worsening shortness of breath.  She had a fall recently and actually is on [facility] way to [facility] today for an appointment.  She was not able to provide her blood pressure.  She says she has had some erratic blood pressure readings at home.  Her last recorded blood pressure was 143/92.  I advised her to bring her blood pressure cuff in if she has some concerns to validate it.  She has had both Covid vaccine shots.  Cholesterol has been well controlled with labs about 8 months ago showed an LDL of 59.  The patient does not have symptoms concerning for COVID-19 infection (fever, chills, cough, or new SHORTNESS OF BREATH).    Prior CV studies:   The following studies were reviewed today:  Chart reviewed Lab work  PMHx:  Past Medical History:  Diagnosis Date  . ALLERGIC RHINITIS   . ANEMIA-NOS   . Anxiety   . Asthma    Triggered by allergies  . COPD    normal PFTs 06/22/11  . Diverticulosis of colon   .  Dyspareunia   . Endometriosis   . GERD   . HEARING LOSS 07/13/2010  . HYPERLIPIDEMIA   . HYPERTENSION   . Irritable bowel syndrome   . MIGRAINE HEADACHE   . OSTEOPOROSIS    on Prolia q 42mo hx vertebral fx  . PALPITATIONS, HX OF   . Vaginal atrophy     Past Surgical History:  Procedure Laterality Date  . ABDOMINAL HYSTERECTOMY  1974  . APPENDECTOMY  1958  . BREAST SURGERY  1985   breast biopsy  . LEFT HEART CATH AND CORONARY ANGIOGRAPHY N/A 12/03/2018   Procedure: LEFT HEART CATH AND CORONARY  ANGIOGRAPHY;  Surgeon: KTroy Sine MD;  [facility];  Service: Cardiovascular;  Laterality: N/A;    FAMHx:  Family History  Adopted: Yes  Problem Relation Age of Onset  . Arthritis Mother   . Breast cancer Mother   . Cancer Mother        started on her spine met cancer  . Bone cancer Mother   . Arthritis Father   . Breast cancer Maternal Aunt   . Diabetes Other        parent & grandparent  . Hyperlipidemia Maternal Grandmother   . Bradycardia Son   . Heart Problems Daughter        enlarged heart  . Cancer - Prostate Brother   . Colon cancer Neg Hx   . Colon polyps Neg Hx     SOCHx:   reports that she quit smoking about 56 years ago. Her smoking use included cigarettes. She has never used smokeless tobacco. She reports current alcohol use. She reports that she does not use drugs.  ALLERGIES:  No Known Allergies  MEDS:  Current Meds  Medication Sig  . acetaminophen (TYLENOL) 500 MG tablet Take 500 mg by mouth every 8 (eight) hours as needed (pain).  .Marland Kitchenaspirin EC 81 MG tablet Take 81 mg by mouth daily.  .Marland Kitchenatorvastatin (LIPITOR) 40 MG tablet Take 40 mg by mouth daily at 6 PM.  . calcium carbonate (TUMS - DOSED IN MG ELEMENTAL CALCIUM) 500 MG chewable tablet Chew 1 tablet by mouth daily.  .Marland Kitchenlisinopril (ZESTRIL) 5 MG tablet Take 1 tablet (5 mg total) by mouth daily.  . metoprolol succinate (TOPROL-XL) 50 MG 24 hr tablet Take 50 mg by mouth daily. Take with or immediately following a meal.  . [DISCONTINUED] atorvastatin (LIPITOR) 20 MG tablet Take 1 tablet (20 mg total) by mouth 2 (two) times daily.  . [DISCONTINUED] cyclobenzaprine (FLEXERIL) 5 MG tablet Take 1 tablet (5 mg total) by mouth 3 (three) times daily as needed for muscle spasms.  . [DISCONTINUED] metoprolol succinate (TOPROL-XL) 25 MG 24 hr tablet TAKE 1 TABLET BY MOUTH TWICE DAILY. TAKE WITH IMMEDIATELY FOLLOWING A MEAL     ROS: Pertinent items noted in HPI and remainder of comprehensive ROS  otherwise negative.  Labs/Other Tests and Data Reviewed:    Recent Labs: 07/17/2019: ALT 17 11/16/2019: BUN 20; Creatinine, Ser 0.94; Hemoglobin 13.4; Platelets 273; Potassium 4.0; Sodium 140   Recent Lipid Panel Lab Results  Component Value Date/Time   CHOL 119 07/17/2019 09:07 AM   CHOL 145 01/08/2019 11:33 AM   TRIG 97.0 07/17/2019 09:07 AM   TRIG 117 08/03/2009 12:00 AM   HDL 40.50 07/17/2019 09:07 AM   HDL 41 01/08/2019 11:33 AM   CHOLHDL 3 07/17/2019 09:07 AM   LDLCALC 59 07/17/2019 09:07 AM   LDLCALC 83 01/08/2019 11:33 AM  LDLDIRECT 193.0 07/13/2010 12:00 AM    Wt Readings from Last 3 Encounters:  03/22/20 135 lb (61.2 kg)  11/20/19 134 lb (60.8 kg)  08/18/19 135 lb 6.4 oz (61.4 kg)     Exam:    Vital Signs:  BP (!) 143/92   Pulse 72   Temp 98.3 F (36.8 C)   Ht 4' 10"  (1.473 m)   Wt 135 lb (61.2 kg)   SpO2 93%   BMI 28.22 kg/m    Deferred  ASSESSMENT & PLAN:    1. Multivessel coronary artery disease with heavy calcification and CAC 500, nonobstructive coronary disease by cath (11/2018) with 20% mid LAD stenosis and 15% proximal to mid LAD stenosis and intramyocardial bridge of the mid LAD 2. Essential hypertension 3. Mixed dyslipidemia 4. LBBB 5. Palpitations  Ms. Zweber continues to do well from a cardiac standpoint.  She denies any further anginal symptoms.  Blood pressure has been somewhat labile and was elevated recently.  She is concerned that the cough may be an issue and I advised her to bring it in to have it validated.  Her cholesterol has been at target with LDL less than 70.  She denies recurrent palpitations.  She is nursing a recent fall and has an issue with her hip that is being evaluated by orthopedics today.  Follow-up with me annually or sooner as necessary.  COVID-19 Education: The signs and symptoms of COVID-19 were discussed with the patient and how to seek care for testing (follow up with PCP or arrange E-visit).  The importance of  social distancing was discussed today.  Patient Risk:   After full review of this patients clinical status, I feel that they are at least moderate risk at this time.   Time:   Today, I have spent 25 minutes with the patient with telehealth technology discussing dyspnea on exertion, left heart cath, CT FFR, palpitations, dyslipidemia, hypertension.     Medication Adjustments/Labs and Tests Ordered: Current medicines are reviewed at length with the patient today.  Concerns regarding medicines are outlined above.   Tests Ordered: No orders of the defined types were placed in this encounter.   Medication Changes: No orders of the defined types were placed in this encounter.   Disposition:  in 6 month(s)  Pixie Casino, MD, [facility], [place] Director of the Advanced Lipid Disorders &  Cardiovascular Risk Reduction Clinic Diplomate of the American Board of Clinical Lipidology Attending Cardiologist  Direct Dial: [phone]  Fax: [phone]  Website:  [email or web address]  Pixie Casino, MD  03/22/2020 8:38 AM

## 2020-04-05 DIAGNOSIS — M25551 Pain in right hip: Secondary | ICD-10-CM | POA: Diagnosis not present

## 2020-04-11 DIAGNOSIS — M25551 Pain in right hip: Secondary | ICD-10-CM | POA: Diagnosis not present

## 2020-04-13 DIAGNOSIS — M7542 Impingement syndrome of left shoulder: Secondary | ICD-10-CM | POA: Diagnosis not present

## 2020-04-15 DIAGNOSIS — M25551 Pain in right hip: Secondary | ICD-10-CM | POA: Diagnosis not present

## 2020-04-19 DIAGNOSIS — M25551 Pain in right hip: Secondary | ICD-10-CM | POA: Diagnosis not present

## 2020-04-22 DIAGNOSIS — M25551 Pain in right hip: Secondary | ICD-10-CM | POA: Diagnosis not present

## 2020-04-26 DIAGNOSIS — M25551 Pain in right hip: Secondary | ICD-10-CM | POA: Diagnosis not present

## 2020-04-27 DIAGNOSIS — M25512 Pain in left shoulder: Secondary | ICD-10-CM | POA: Diagnosis not present

## 2020-05-05 DIAGNOSIS — M25551 Pain in right hip: Secondary | ICD-10-CM | POA: Diagnosis not present

## 2020-05-05 DIAGNOSIS — M7061 Trochanteric bursitis, right hip: Secondary | ICD-10-CM | POA: Diagnosis not present

## 2020-05-10 ENCOUNTER — Other Ambulatory Visit: Payer: Self-pay | Admitting: Internal Medicine

## 2020-05-27 DIAGNOSIS — M7502 Adhesive capsulitis of left shoulder: Secondary | ICD-10-CM | POA: Diagnosis not present

## 2020-05-27 DIAGNOSIS — M7542 Impingement syndrome of left shoulder: Secondary | ICD-10-CM | POA: Diagnosis not present

## 2020-05-27 DIAGNOSIS — M25512 Pain in left shoulder: Secondary | ICD-10-CM | POA: Diagnosis not present

## 2020-07-15 MED ORDER — METOPROLOL SUCCINATE ER 50 MG PO TB24: 50.0000 mg | ORAL_TABLET | Freq: Every day | ORAL | 3 refills | Status: DC

## 2020-08-23 IMAGING — DX DG RIBS 2V*R*
2 series · 2 of 2 positions shown · non-contrast
Comparison: None.

CLINICAL DATA: Pain after fall

EXAM:
RIGHT RIBS - 2 VIEW

[rib pa]
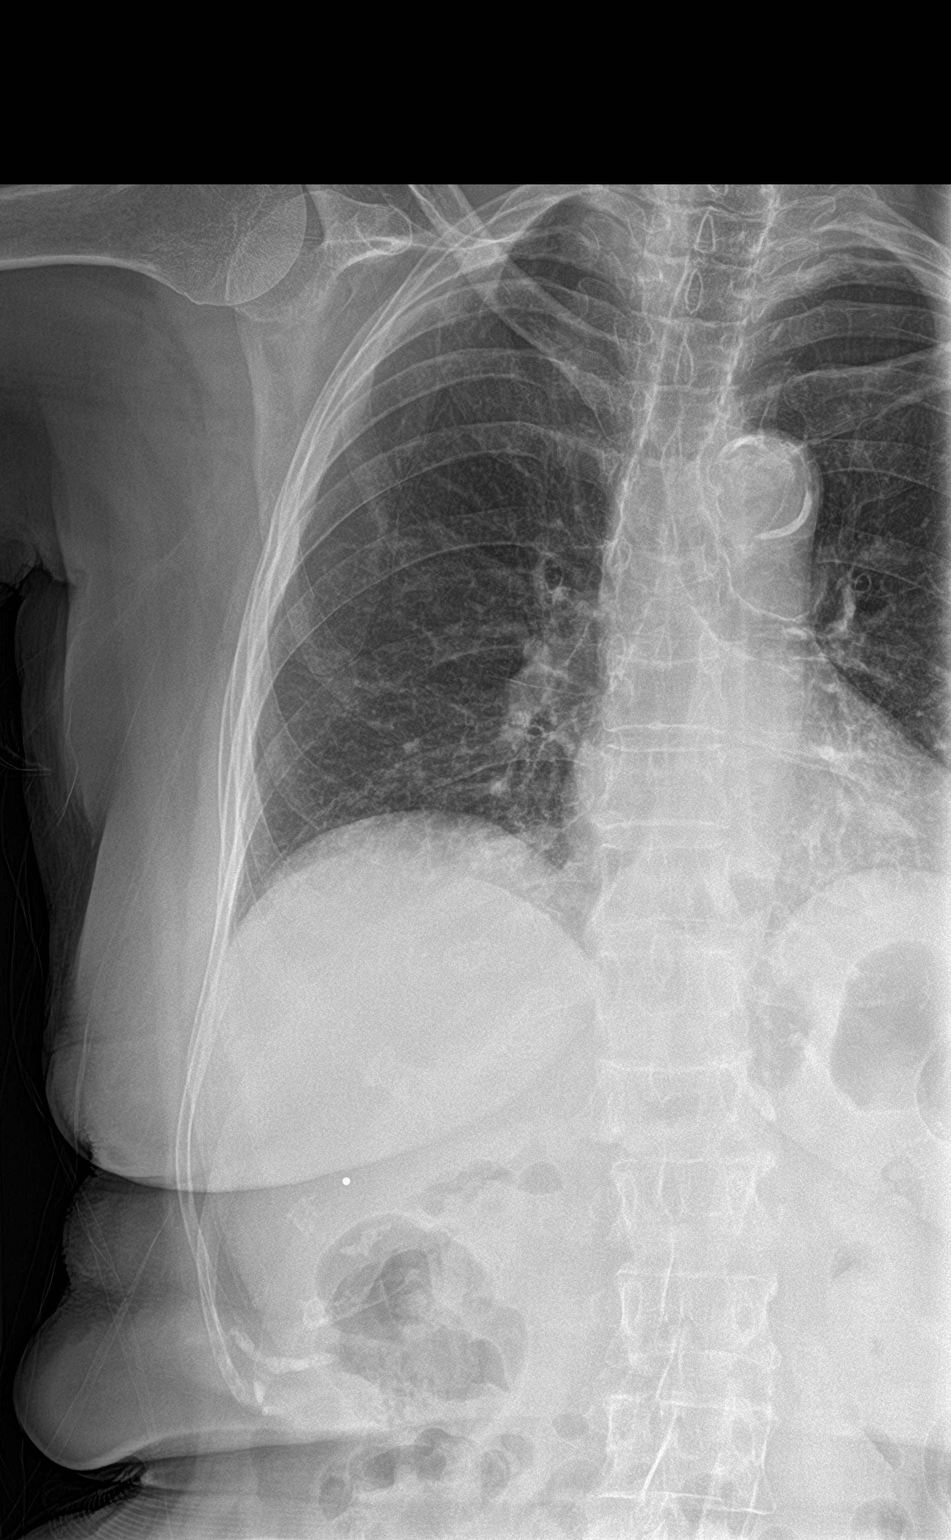

[rib obl]
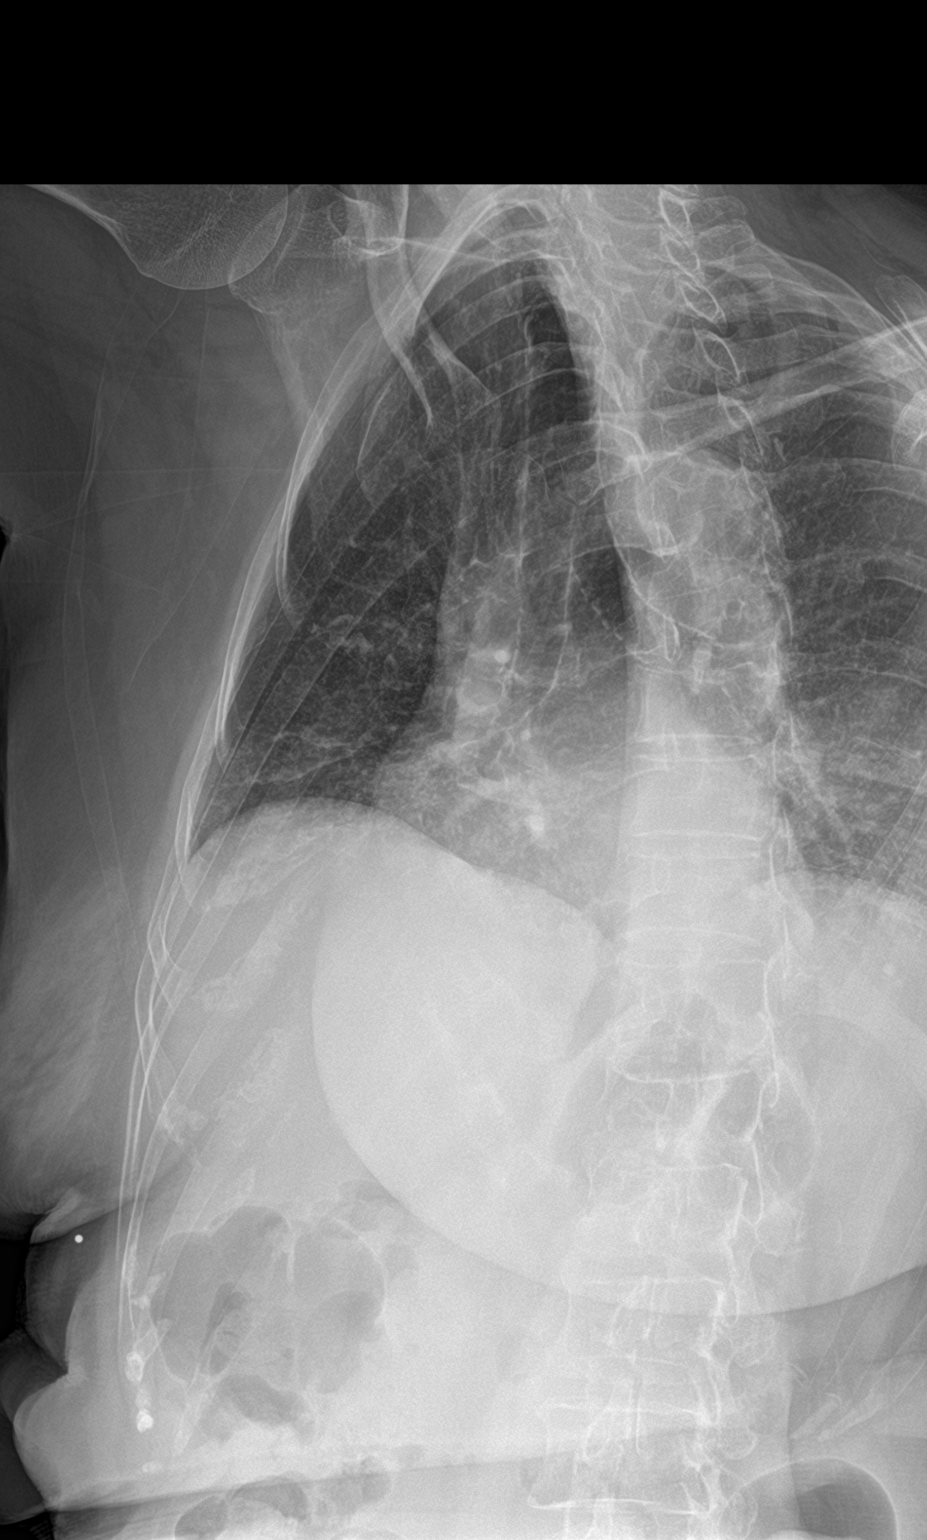

[2 of 2 positions shown; findings below may reference images not displayed]

FINDINGS: No fracture or other bone lesions are seen involving the ribs.
Aortic knob calcifications.
IMPRESSION: Negative.

## 2020-08-23 IMAGING — DX DG ELBOW COMPLETE 3+V*L*
4 series · 4 of 4 positions shown · non-contrast
Comparison: None.

CLINICAL DATA: Pain after fall

EXAM:
LEFT ELBOW - COMPLETE 3+ VIEW

[elbow ap]
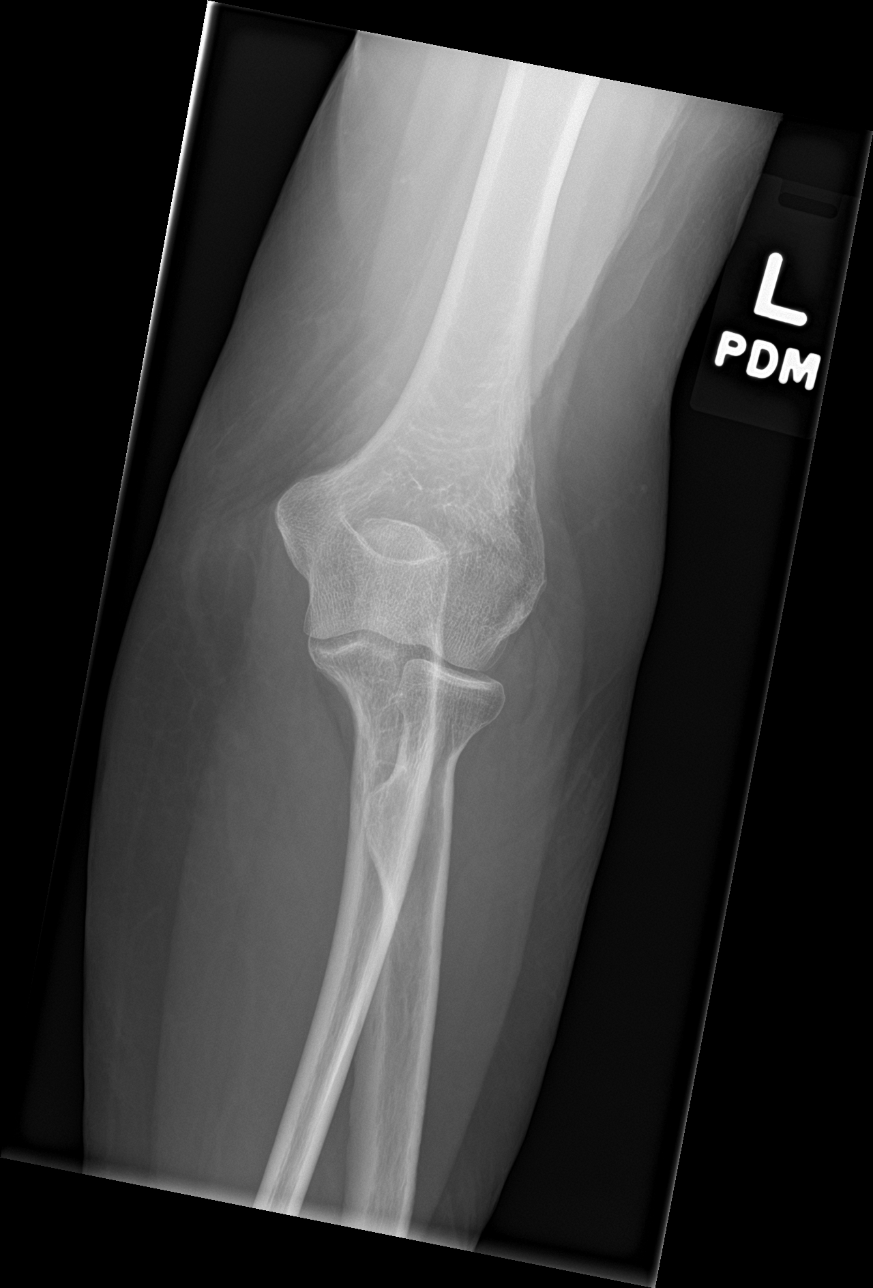

[elbow obl (1 of 2)]
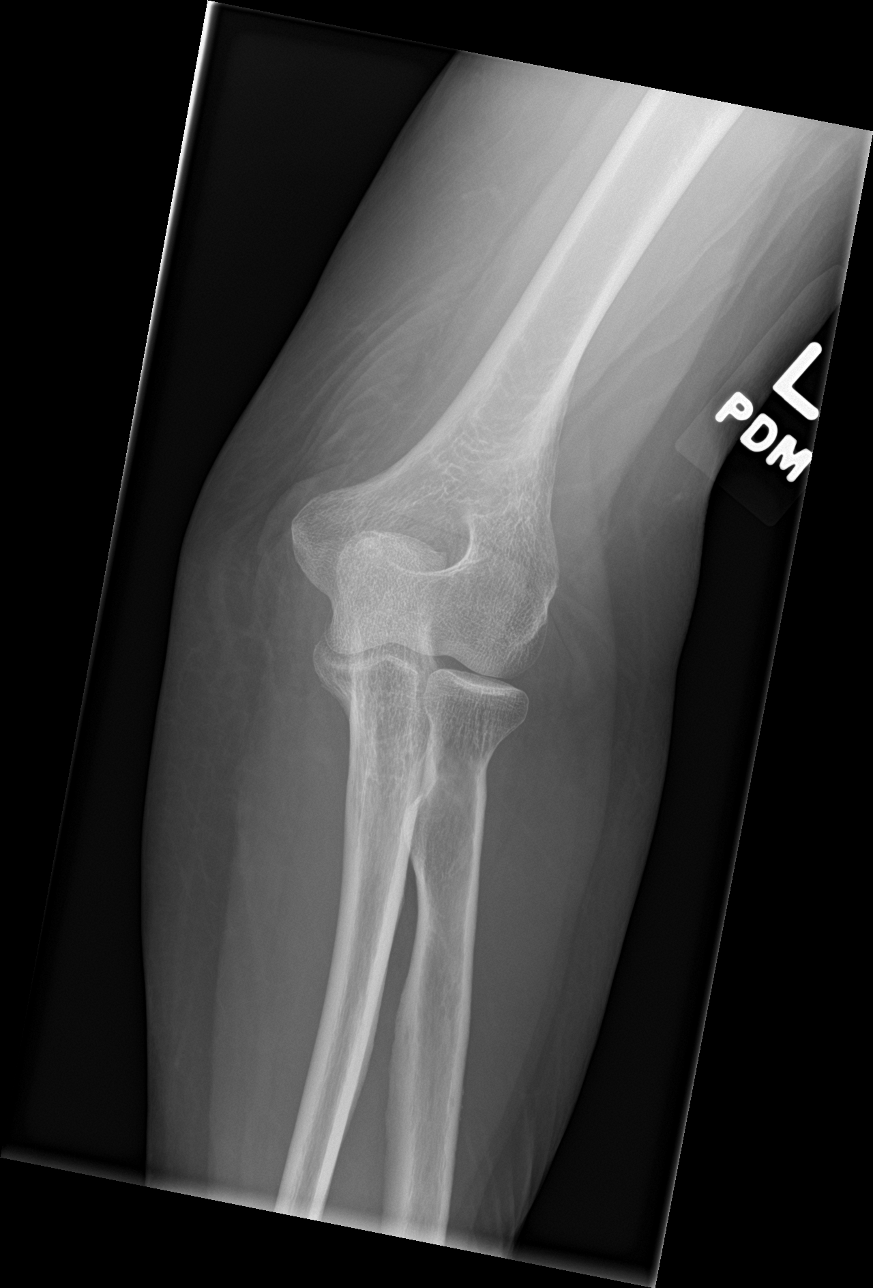

[elbow obl (2 of 2)]
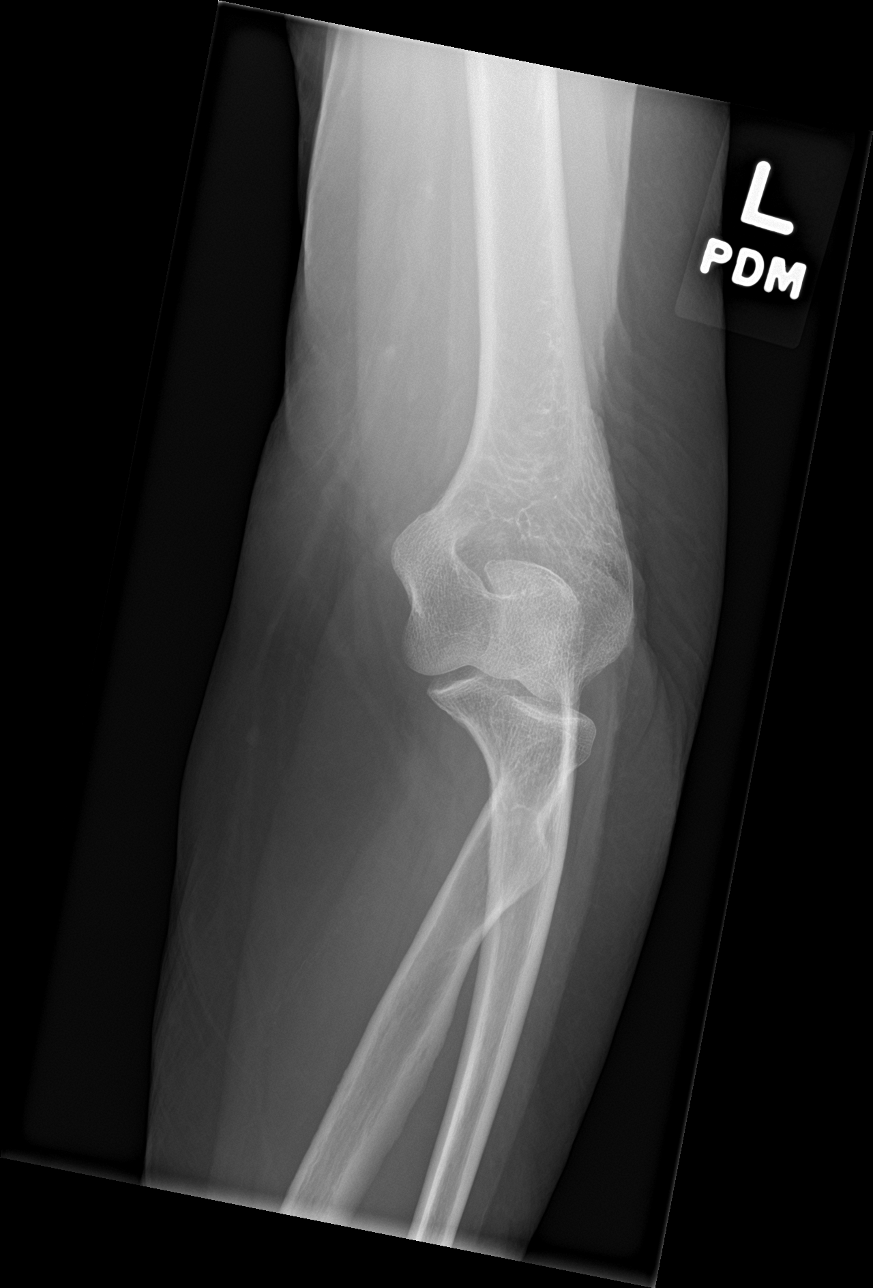

[elbow lat]
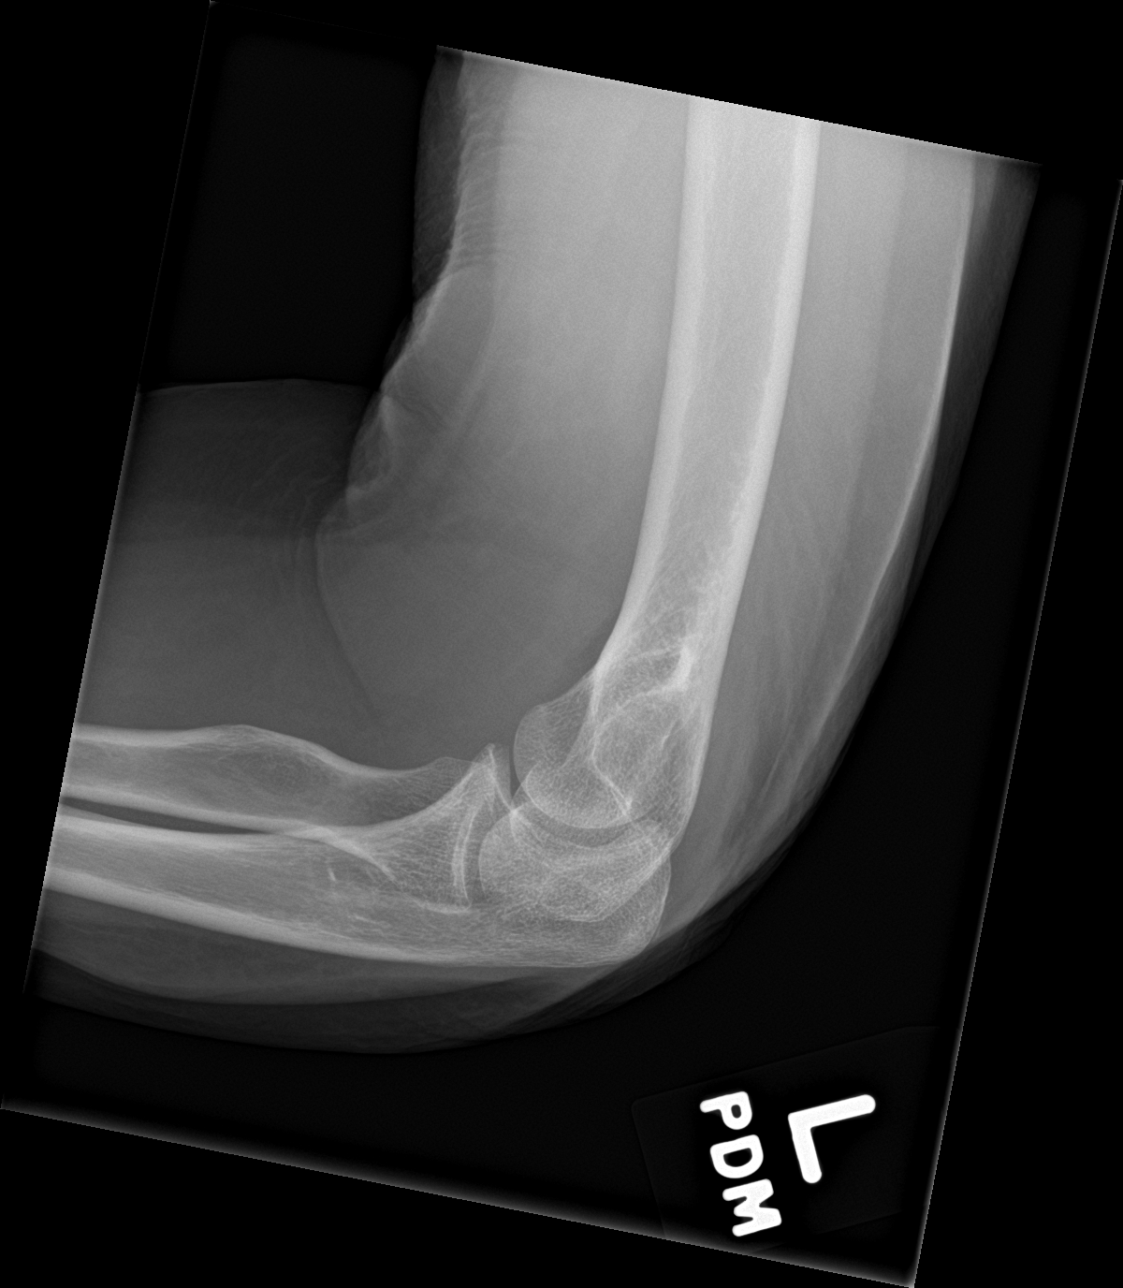

[4 of 4 positions shown; findings below may reference images not displayed]

FINDINGS: There is no evidence of fracture, dislocation, or joint effusion.
There is no evidence of arthropathy or other focal bone abnormality.
Soft tissues are unremarkable.
IMPRESSION: Negative.

## 2020-08-26 ENCOUNTER — Other Ambulatory Visit: Payer: Self-pay | Admitting: Critical Care Medicine

## 2020-08-26 ENCOUNTER — Other Ambulatory Visit: Payer: Medicare PPO

## 2020-08-26 DIAGNOSIS — Z20822 Contact with and (suspected) exposure to covid-19: Secondary | ICD-10-CM | POA: Diagnosis not present

## 2020-08-27 LAB — SARS-COV-2, NAA 2 DAY TAT

## 2020-08-27 LAB — NOVEL CORONAVIRUS, NAA: SARS-CoV-2, NAA: NOT DETECTED

## 2020-09-08 ENCOUNTER — Encounter: Payer: Self-pay | Admitting: Internal Medicine

## 2020-09-08 ENCOUNTER — Ambulatory Visit: Payer: Medicare PPO

## 2020-09-08 NOTE — Telephone Encounter (Signed)
This pt sent msg stating needing her Prolia injection...Regina Ramsey

## 2020-09-12 ENCOUNTER — Other Ambulatory Visit: Payer: Self-pay

## 2020-09-12 ENCOUNTER — Ambulatory Visit (INDEPENDENT_AMBULATORY_CARE_PROVIDER_SITE_OTHER): Payer: Medicare PPO

## 2020-09-12 DIAGNOSIS — Z23 Encounter for immunization: Secondary | ICD-10-CM

## 2020-09-12 DIAGNOSIS — M810 Age-related osteoporosis without current pathological fracture: Secondary | ICD-10-CM | POA: Diagnosis not present

## 2020-09-12 MED ORDER — CYANOCOBALAMIN 1000 MCG/ML IJ SOLN: 1000.0000 ug | Freq: Once | INTRAMUSCULAR | Status: DC

## 2020-09-12 MED ORDER — DENOSUMAB 60 MG/ML ~~LOC~~ SOSY
60.0000 mg | PREFILLED_SYRINGE | Freq: Once | SUBCUTANEOUS | Status: AC
  Administered 2020-09-12: 60 mg via SUBCUTANEOUS

## 2020-09-12 NOTE — Progress Notes (Addendum)
Pt was given Prolia. Pt tolerated well.

## 2020-09-12 NOTE — Addendum Note (Signed)
Addended by: Marijean Heath R on: 09/12/2020 09:44 AM   Modules accepted: Orders

## 2020-09-12 NOTE — Addendum Note (Signed)
Addended by: Marijean Heath R on: 09/12/2020 09:37 AM   Modules accepted: Orders

## 2020-11-28 ENCOUNTER — Other Ambulatory Visit: Payer: Self-pay | Admitting: Internal Medicine

## 2020-12-12 DIAGNOSIS — H04123 Dry eye syndrome of bilateral lacrimal glands: Secondary | ICD-10-CM | POA: Diagnosis not present

## 2020-12-12 DIAGNOSIS — Z961 Presence of intraocular lens: Secondary | ICD-10-CM | POA: Diagnosis not present

## 2020-12-26 DIAGNOSIS — M13841 Other specified arthritis, right hand: Secondary | ICD-10-CM | POA: Diagnosis not present

## 2020-12-26 DIAGNOSIS — M79641 Pain in right hand: Secondary | ICD-10-CM | POA: Diagnosis not present

## 2021-03-14 ENCOUNTER — Ambulatory Visit: Payer: Medicare PPO | Admitting: Dermatology

## 2021-03-14 DIAGNOSIS — Z20822 Contact with and (suspected) exposure to covid-19: Secondary | ICD-10-CM | POA: Diagnosis not present

## 2021-03-17 NOTE — Telephone Encounter (Signed)
Patient scheduled for 3/23 for next Prolia Has new authorization been obtained?

## 2021-03-20 ENCOUNTER — Telehealth: Payer: Self-pay | Admitting: Internal Medicine

## 2021-03-20 NOTE — Telephone Encounter (Signed)
Regina Ramsey is scheduled for her Prolia injection on March 22, 2021  Authorization was extended per Ander Purpura at [facility] [place]:  Auth# [id number] Dates: 8.9.21 through 12.31.22  Sent patient a my chart message to confirm.  No copay required

## 2021-03-21 ENCOUNTER — Other Ambulatory Visit: Payer: Self-pay

## 2021-03-22 ENCOUNTER — Ambulatory Visit: Payer: Medicare PPO

## 2021-04-10 ENCOUNTER — Other Ambulatory Visit: Payer: Self-pay | Admitting: Internal Medicine

## 2021-04-10 ENCOUNTER — Encounter: Payer: Self-pay | Admitting: Dermatology

## 2021-04-10 ENCOUNTER — Other Ambulatory Visit: Payer: Self-pay

## 2021-04-10 ENCOUNTER — Ambulatory Visit: Payer: Medicare PPO | Admitting: Dermatology

## 2021-04-10 DIAGNOSIS — L821 Other seborrheic keratosis: Secondary | ICD-10-CM | POA: Diagnosis not present

## 2021-04-10 DIAGNOSIS — I872 Venous insufficiency (chronic) (peripheral): Secondary | ICD-10-CM

## 2021-04-10 DIAGNOSIS — Z1283 Encounter for screening for malignant neoplasm of skin: Secondary | ICD-10-CM

## 2021-04-10 DIAGNOSIS — L300 Nummular dermatitis: Secondary | ICD-10-CM | POA: Diagnosis not present

## 2021-04-10 DIAGNOSIS — B351 Tinea unguium: Secondary | ICD-10-CM | POA: Diagnosis not present

## 2021-04-10 MED ORDER — CLOBETASOL PROP EMOLLIENT BASE 0.05 % EX CREA: 1.0000 "application " | TOPICAL_CREAM | Freq: Every day | CUTANEOUS | 11 refills | Status: DC

## 2021-04-10 NOTE — Patient Instructions (Addendum)
OVER THE COUNTER-10% urea Can purchase from [facility] for 10-15 min, take out feet and apply to cracks before bed time, cover with socks while sleeping.

## 2021-04-19 ENCOUNTER — Encounter: Payer: Self-pay | Admitting: Dermatology

## 2021-04-19 NOTE — Progress Notes (Signed)
   Follow-Up Visit   Subjective  Regina Ramsey is a 80 y.o. female who presents for the following: Skin Problem (Patient has extremely dry skin, feet have began cracking to the point of bleeding x 3 months. Lesion on right lower leg has gotten better since making appointment. ).  General skin check compliance in relapsing rash on legs Location:  Duration:  Quality:  Associated Signs/Symptoms: Modifying Factors:  Severity:  Timing: Context:   Objective  Well appearing patient in no apparent distress; mood and affect are within normal limits. Objective  Scalp: Checked face and back. No atypical moles, no skin cancer.   Objective  Head - Anterior (Face): Face and back: Flattopped brown textured 48 mm papules  Objective  Left Hallux Toe Nail Plate, Right Hallux Toe Nail Plate: Thickening, opacification, some subungual debris.  Objective  Right Lower Leg - Anterior: Modest edema with some patchy dermatitis  Objective  Right Lower Leg - Anterior: Leg rash is likely a mixture of mild stasis plus nummular eczema.    A full examination was performed including scalp, head, eyes, ears, nose, lips, neck, chest, axillae, abdomen, back, buttocks, bilateral upper extremities, bilateral lower extremities, hands, feet, fingers, toes, fingernails, and toenails. All findings within normal limits unless otherwise noted below.  Areas beneath undergarments not fully examined.   Assessment & Plan    Screening exam for skin cancer Scalp  Yearly skin check.  Seborrheic keratosis Head - Anterior (Face)  May leave if stable  Fungal nail infection (2) Left Hallux Toe Nail Plate; Right Hallux Toe Nail Plate  All therapeutic options reviewed, for now she is comfortable with nonintervention.  Venous stasis dermatitis of right lower extremity Right Lower Leg - Anterior  Techniques to minimize venous pressure discussed.  May use topical clobetasol after bathing for negative rash; once  improved, she will taper from daily use.  Avoid using this on face and body folds.  Nummular eczema Right Lower Leg - Anterior  As needed topical clobetasol.  Clobetasol Prop Emollient Base (CLOBETASOL PROPIONATE E) 0.05 % emollient cream - Right Lower Leg - Anterior      I, Lavonna Monarch, MD, have reviewed all documentation for this visit.  The documentation on 04/19/21 for the exam, diagnosis, procedures, and orders are all accurate and complete.

## 2021-05-24 DIAGNOSIS — U071 COVID-19: Secondary | ICD-10-CM | POA: Diagnosis not present

## 2021-05-24 DIAGNOSIS — Z20822 Contact with and (suspected) exposure to covid-19: Secondary | ICD-10-CM | POA: Diagnosis not present

## 2021-06-06 DIAGNOSIS — M542 Cervicalgia: Secondary | ICD-10-CM | POA: Diagnosis not present

## 2021-06-15 DIAGNOSIS — M542 Cervicalgia: Secondary | ICD-10-CM | POA: Diagnosis not present

## 2021-06-26 DIAGNOSIS — M542 Cervicalgia: Secondary | ICD-10-CM | POA: Diagnosis not present

## 2021-07-05 DIAGNOSIS — M542 Cervicalgia: Secondary | ICD-10-CM | POA: Diagnosis not present

## 2021-07-07 DIAGNOSIS — M542 Cervicalgia: Secondary | ICD-10-CM | POA: Diagnosis not present

## 2021-07-10 DIAGNOSIS — M542 Cervicalgia: Secondary | ICD-10-CM | POA: Diagnosis not present

## 2021-07-13 DIAGNOSIS — M542 Cervicalgia: Secondary | ICD-10-CM | POA: Diagnosis not present

## 2021-07-18 DIAGNOSIS — M542 Cervicalgia: Secondary | ICD-10-CM | POA: Diagnosis not present

## 2021-07-20 DIAGNOSIS — M542 Cervicalgia: Secondary | ICD-10-CM | POA: Diagnosis not present

## 2021-07-24 ENCOUNTER — Other Ambulatory Visit: Payer: Self-pay | Admitting: Internal Medicine

## 2021-07-25 ENCOUNTER — Other Ambulatory Visit: Payer: Self-pay | Admitting: Internal Medicine

## 2021-09-07 ENCOUNTER — Other Ambulatory Visit: Payer: Self-pay | Admitting: Internal Medicine

## 2021-09-17 ENCOUNTER — Encounter: Payer: Self-pay | Admitting: Internal Medicine

## 2021-09-17 NOTE — Progress Notes (Addendum)
Subjective:    Patient ID: Regina Ramsey, female    DOB: [birth year], 80 y.o.   MRN: [id number]  This visit occurred during the SARS-CoV-2 public health emergency.  Safety protocols were in place, including screening questions prior to the visit, additional usage of staff PPE, and extensive cleaning of exam room while observing appropriate contact time as indicated for disinfecting solutions.    HPI The patient is here for an acute visit.   Bleeding and numbness of feet - 3 months her feet have been giving her a fit.  Her heels were cracking and her entire plantar surface was dead skin and they were bleeding.  Had been doing hot water baths 2-3 times a day.  She has been doing baby oil and cool baths and it has gotten a little better.  She has more feeling.  She saw Dr Denna Haggard and was prescribed clobetasol and it did not help.    Her finger tips  started to get dry and they were cracking and bleeding.  - - progressed from one finger tip to all finger tips  H/o B12 def.  Not currently taking B12.    She is concerned about if she has diabetes.     Medications and allergies reviewed with patient and updated if appropriate.  Patient Active Problem List   Diagnosis Date Noted   Left elbow pain 11/20/2019   Rib pain on right side 11/20/2019   Dark urine 08/18/2019   Spondylolisthesis, lumbar region 10/25/2018   Plantar fasciitis 10/14/2018   Right hip pain 10/14/2018   Coronary artery calcification 10/01/2018   B12 deficiency 05/08/2018   IBS (irritable bowel syndrome) 06/03/2017   Low back pain 10/11/2016   Shortness of breath 05/31/2016   LBBB (left bundle branch block) 05/31/2016   Prediabetes 04/15/2016   Palpitations 04/11/2016   History of colonic polyps 03/07/2015   Vaginal atrophy 07/23/2014   Dyspareunia 07/23/2014   Hx of endometriosis 06/30/2014   Diverticulosis of colon 06/30/2014   Osteoporosis 08/16/2010   Hyperlipidemia 07/13/2010   ANEMIA-NOS 07/13/2010    HEARING LOSS 07/13/2010   Essential hypertension 07/13/2010   GERD 07/13/2010    Current Outpatient Medications on File Prior to Visit  Medication Sig Dispense Refill   acetaminophen (TYLENOL) 500 MG tablet Take 500 mg by mouth every 8 (eight) hours as needed (pain).     aspirin EC 81 MG tablet Take 81 mg by mouth daily.     atorvastatin (LIPITOR) 40 MG tablet TAKE 1 TABLET(40 MG) BY MOUTH DAILY AT 6 PM 90 tablet 3   calcium carbonate (TUMS - DOSED IN MG ELEMENTAL CALCIUM) 500 MG chewable tablet Chew 1 tablet by mouth daily as needed.     lisinopril (ZESTRIL) 5 MG tablet TAKE 1 TABLET(5 MG) BY MOUTH DAILY 90 tablet 1   Loperamide HCl (IMODIUM PO) Take by mouth. Patient takes every 3-5 days as needed     metoprolol succinate (TOPROL-XL) 50 MG 24 hr tablet Take 1 tablet (50 mg total) by mouth daily. NEED OV. 90 tablet 0   No current facility-administered medications on file prior to visit.    Past Medical History:  Diagnosis Date   ALLERGIC RHINITIS    ANEMIA-NOS    Anxiety    Asthma    Triggered by allergies   COPD    normal PFTs 06/22/11   Diverticulosis of colon    Dyspareunia    Endometriosis    GERD    HEARING LOSS  07/13/2010   HYPERLIPIDEMIA    HYPERTENSION    Irritable bowel syndrome    MIGRAINE HEADACHE    OSTEOPOROSIS    on Prolia q 30mo hx vertebral fx   PALPITATIONS, HX OF    Vaginal atrophy     Past Surgical History:  Procedure Laterality Date   ABDOMINAL HYSTERECTOMY  [facility]  breast biopsy   LEFT HEART CATH AND CORONARY ANGIOGRAPHY N/A 12/03/2018   Procedure: LEFT HEART CATH AND CORONARY ANGIOGRAPHY;  Surgeon: KTroy Sine MD;  Location: [facility];  Service: Cardiovascular;  Laterality: N/A;    Social History   Socioeconomic History   Marital status: Married    Spouse name: Not on file   Number of children: 2   Years of education: Not on file   Highest education level: Not on file   Occupational History   Occupation: Retired  Tobacco Use   Smoking status: Former    Types: Cigarettes    Quit date: 01/01/1964    Years since quitting: 57.7   Smokeless tobacco: Never   Tobacco comments:    Married, [place] [employer] education-specialist needs for grade school  Substance and Sexual Activity   Alcohol use: Yes    Alcohol/week: 0.0 standard drinks    Comment: Social   Drug use: No   Sexual activity: Not on file  Other Topics Concern   Not on file  Social History Narrative   Retired tPharmacist, hospital     No regular exercise, active      epworth sleepiness scale = 10 (05/30/16)   Social Determinants of Health   Financial Resource Strain: Not on file  Food Insecurity: Not on file  Transportation Needs: Not on file  Physical Activity: Not on file  Stress: Not on file  Social Connections: Not on file    Family History  Adopted: Yes  Problem Relation Age of Onset   Arthritis Mother    Breast cancer Mother    Cancer Mother        started on her spine met cancer   Bone cancer Mother    Arthritis Father    Breast cancer Maternal Aunt    Diabetes Other        parent & grandparent   Hyperlipidemia Maternal Grandmother    Bradycardia Son    Heart Problems Daughter        enlarged heart   Cancer - Prostate Brother    Colon cancer Neg Hx    Colon polyps Neg Hx     Review of Systems  Constitutional:  Negative for fever.  Respiratory:  Positive for shortness of breath (chronic - no change) and wheezing (chronic - no change). Negative for cough.   Cardiovascular:  Negative for chest pain, palpitations and leg swelling.  Endocrine: Negative for polydipsia and polyuria.  Neurological:  Positive for numbness (toes and feet). Negative for light-headedness and headaches.      Objective:   Vitals:   09/18/21 1540  BP: 120/68  Pulse: 80  Temp: 98.2 F (36.8 C)  SpO2: 96%   BP Readings from Last 3 Encounters:  09/18/21 120/68  03/22/20 (!) 143/92   11/20/19 140/80   Wt Readings from Last 3 Encounters:  09/18/21 130 lb 9.6 oz (59.2 kg)  03/22/20 135 lb (61.2 kg)  11/20/19 134 lb (60.8 kg)   Body mass index is 27.3 kg/m.  Depression screen PHQ  2/9 08/18/2019 05/08/2018 10/18/2016 12/19/2015 04/01/2013  Decreased Interest 0 0 0 0 0  Down, Depressed, Hopeless 0 0 0 0 0  PHQ - 2 Score 0 0 0 0 0       Physical Exam Constitutional:      General: She is not in acute distress.    Appearance: Normal appearance. She is not ill-appearing.  HENT:     Head: Normocephalic and atraumatic.  Skin:    General: Skin is warm and dry.     Comments: B/l plantar surfaces of feet with dry, flaky skin, no major cracks in skin - no bleeding  Neurological:     Mental Status: She is alert.     Sensory: Sensory deficit (decreased sensation b/l plantar surfaces) present.           Assessment & Plan:     Screened for depression using the PHQ 9 scale.  No evidence of depression.    See Problem List for Assessment and Plan of chronic medical problems.

## 2021-09-18 ENCOUNTER — Other Ambulatory Visit: Payer: Self-pay

## 2021-09-18 ENCOUNTER — Ambulatory Visit: Payer: Medicare PPO | Admitting: Internal Medicine

## 2021-09-18 VITALS — BP 120/68 | HR 80 | Temp 98.2°F | Ht <= 58 in | Wt 130.6 lb

## 2021-09-18 DIAGNOSIS — Z1331 Encounter for screening for depression: Secondary | ICD-10-CM | POA: Diagnosis not present

## 2021-09-18 DIAGNOSIS — L853 Xerosis cutis: Secondary | ICD-10-CM

## 2021-09-18 DIAGNOSIS — R200 Anesthesia of skin: Secondary | ICD-10-CM | POA: Diagnosis not present

## 2021-09-18 DIAGNOSIS — E538 Deficiency of other specified B group vitamins: Secondary | ICD-10-CM

## 2021-09-18 DIAGNOSIS — R7303 Prediabetes: Secondary | ICD-10-CM | POA: Diagnosis not present

## 2021-09-18 DIAGNOSIS — Z23 Encounter for immunization: Secondary | ICD-10-CM

## 2021-09-18 MED ORDER — CICLOPIROX OLAMINE 0.77 % EX CREA: TOPICAL_CREAM | Freq: Two times a day (BID) | CUTANEOUS | 0 refills | Status: DC

## 2021-09-18 MED ORDER — AMMONIUM LACTATE 12 % EX LOTN
1.0000 | TOPICAL_LOTION | CUTANEOUS | 0 refills | Status: DC | PRN
Start: 2021-09-18 — End: 2022-03-27

## 2021-09-18 NOTE — Assessment & Plan Note (Signed)
Acute B/l plantar surface numbness/decreased sensation ? Related to B12 def, elevated sugar Check B12, a1c, cbc, cmp May need further evaluation for neuropathy

## 2021-09-18 NOTE — Assessment & Plan Note (Signed)
Chronic Not taking B12 ? Causing some of her symptoms Ck B12 level

## 2021-09-18 NOTE — Assessment & Plan Note (Signed)
Chronic Check a1c Low sugar / carb diet Stressed regular exercise  

## 2021-09-18 NOTE — Assessment & Plan Note (Signed)
Acute Severe in nature-bilateral feet and bilateral fingertips No improvement with clobetasol Has been using bath oil and has been taking exfoliating feet No currently bleeding, but feet are sensitive and furt Ciclopirox cream bid, then amlactin  Multiple times a day Check cbc, cmp, B12, a1c Follow up if no improvement

## 2021-09-18 NOTE — Patient Instructions (Addendum)
  Blood work was ordered.     Medications changes include :   loprox twice daily ( anti-fungal) and amlactin lotion.   Your prescription(s) have been submitted to your pharmacy. Please take as directed and contact our office if you believe you are having problem(s) with the medication(s).

## 2021-09-19 LAB — COMPREHENSIVE METABOLIC PANEL
ALT: 17 U/L (ref 0–35)
AST: 20 U/L (ref 0–37)
Albumin: 4 g/dL (ref 3.5–5.2)
Alkaline Phosphatase: 64 U/L (ref 39–117)
BUN: 22 mg/dL (ref 6–23)
CO2: 27 mEq/L (ref 19–32)
Calcium: 9.4 mg/dL (ref 8.4–10.5)
Chloride: 107 mEq/L (ref 96–112)
Creatinine, Ser: 0.96 mg/dL (ref 0.40–1.20)
GFR: 55.89 mL/min — ABNORMAL LOW (ref >60.00)
Glucose, Bld: 130 mg/dL — ABNORMAL HIGH (ref 70–99)
Potassium: 4 mEq/L (ref 3.5–5.1)
Sodium: 144 mEq/L (ref 135–145)
Total Bilirubin: 0.6 mg/dL (ref 0.2–1.2)
Total Protein: 7 g/dL (ref 6.0–8.3)

## 2021-09-19 LAB — HEMOGLOBIN A1C: Hgb A1c MFr Bld: 6 % (ref 4.6–6.5)

## 2021-09-19 LAB — CBC WITH DIFFERENTIAL/PLATELET
Basophils Absolute: 0.1 10*3/uL (ref 0.0–0.1)
Basophils Relative: 1.6 % (ref 0.0–3.0)
Eosinophils Absolute: 0.2 10*3/uL (ref 0.0–0.7)
Eosinophils Relative: 2.4 % (ref 0.0–5.0)
HCT: 40.5 % (ref 36.0–46.0)
Hemoglobin: 13.2 g/dL (ref 12.0–15.0)
Lymphocytes Relative: 24.8 % (ref 12.0–46.0)
Lymphs Abs: 1.6 10*3/uL (ref 0.7–4.0)
MCHC: 32.5 g/dL (ref 30.0–36.0)
MCV: 96.2 fl (ref 78.0–100.0)
Monocytes Absolute: 0.8 10*3/uL (ref 0.1–1.0)
Monocytes Relative: 12.5 % — ABNORMAL HIGH (ref 3.0–12.0)
Neutro Abs: 3.9 10*3/uL (ref 1.4–7.7)
Neutrophils Relative %: 58.7 % (ref 43.0–77.0)
Platelets: 239 10*3/uL (ref 150.0–400.0)
RBC: 4.21 Mil/uL (ref 3.87–5.11)
RDW: 13.7 % (ref 11.5–15.5)
WBC: 6.6 10*3/uL (ref 4.0–10.5)

## 2021-09-19 LAB — VITAMIN B12: Vitamin B-12: 214 pg/mL (ref 211–911)

## 2021-09-30 ENCOUNTER — Telehealth: Payer: Self-pay

## 2021-09-30 NOTE — Telephone Encounter (Signed)
Prolia VOB initiated via [email or web address]  Last OV:  Next OV:  Last Prolia inj: 09/12/20 Next Prolia inj DUE: 03/13/21

## 2021-10-05 NOTE — Telephone Encounter (Signed)
Prior Auth required for Prolia  PA PROCESS DETAILS: PA is required. PA can be initiated by calling [phone] or online at [email or web address]

## 2021-10-17 ENCOUNTER — Other Ambulatory Visit: Payer: Self-pay | Admitting: Internal Medicine

## 2021-10-19 NOTE — Telephone Encounter (Signed)
PA initiated via [email or web address]

## 2021-11-06 NOTE — Telephone Encounter (Addendum)
Pt ready for scheduling on or after 03/13/21     Out-of-pocket cost due at time of visit: $0.00     Primary: [employer]  Prolia co-insurance: 0%  Admin fee co-insurance: 0%     Secondary: n/a  Prolia co-insurance:  Admin fee co-insurance:     Deductible: does not apply     Prior Auth: APPROVED  PA# [id number]  Valid 08/08/20-12/30/21     PA# [id number]  Valid: 12/31/21-12/30/22        ** This summary of benefits is an estimation of the patient's out-of-pocket cost. Exact cost may very based on individual plan coverage. 

## 2021-12-05 NOTE — Telephone Encounter (Signed)
Attempted to reach pt to schedule appt for injection. No VM set up, call air dropped 2x.

## 2021-12-09 ENCOUNTER — Other Ambulatory Visit: Payer: Self-pay | Admitting: Internal Medicine

## 2021-12-13 ENCOUNTER — Other Ambulatory Visit: Payer: Self-pay | Admitting: Internal Medicine

## 2021-12-13 ENCOUNTER — Encounter: Payer: Self-pay | Admitting: Internal Medicine

## 2021-12-14 NOTE — Telephone Encounter (Signed)
Spoke with patient today.  Placard mailed out today to new address on file.

## 2022-01-08 DIAGNOSIS — Z961 Presence of intraocular lens: Secondary | ICD-10-CM | POA: Diagnosis not present

## 2022-01-08 DIAGNOSIS — H02831 Dermatochalasis of right upper eyelid: Secondary | ICD-10-CM | POA: Diagnosis not present

## 2022-01-10 ENCOUNTER — Other Ambulatory Visit: Payer: Self-pay | Admitting: Internal Medicine

## 2022-01-13 NOTE — Telephone Encounter (Signed)
Pt has not received Prolia inj in > 1 year.   Pt archived in [facility] portal.   Forwarding to PCP as FYI.

## 2022-01-16 NOTE — Telephone Encounter (Signed)
Noted.  Will discuss at next appt.

## 2022-03-25 NOTE — Progress Notes (Signed)
? ? ?Subjective:  ? ? Patient ID: BARBARANN KELLY, female    DOB: 1941-11-25, 81 y.o.   MRN: [id number] ? ?This visit occurred during the SARS-CoV-2 public health emergency.  Safety protocols were in place, including screening questions prior to the visit, additional usage of staff PPE, and extensive cleaning of exam room while observing appropriate contact time as indicated for disinfecting solutions. ? ? ? ?HPI ?San is here for  ?Chief Complaint  ?Patient presents with  ? Headache  ?  Headache x 3 weeks  ? Tremors  ?  Tremors right hand  ? ? ?Headache x 3 weeks - has been alternating goody powders and aleve.  When she went to sleep would take one or the other.  She tries not to take anything during the day.  The pain is behind right eye and over to right ear.  The pain is intractable - the pain meds would dull it a little.  Now it is dull pain.  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  She has had increased stress.   ? ? ?Tremor in hands  - b/l tremor for years.  Saturday night she went out to dinner - had difficulty eating soup.  The tremor is intermittent R > L.  Once a twice she has had a head tremor - when she has gone to the dentist.  No family history or tremors.  She notices it when she writes or eats.   ? ? ?OP - no prolia > 18  months  - ? treatment-she agrees to have bone density and then reevaluate medication need. ? ? ?Medications and allergies reviewed with patient and updated if appropriate. ? ?Current Outpatient Medications on File Prior to Visit  ?Medication Sig Dispense Refill  ? acetaminophen (TYLENOL) 500 MG tablet Take 500 mg by mouth every 8 (eight) hours as needed (pain). (Patient not taking: Reported on 03/27/2022)    ? ammonium lactate (AMLACTIN) 12 % lotion Apply 1 application topically as needed for dry skin. (Patient not taking: Reported on 03/27/2022) 400 g 0  ? aspirin EC 81 MG tablet Take 81 mg by mouth daily. (Patient not taking: Reported on  03/27/2022)    ? atorvastatin (LIPITOR) 40 MG tablet TAKE 1 TABLET(40 MG) BY MOUTH DAILY AT 6 PM (Patient not taking: Reported on 03/27/2022) 90 tablet 3  ? calcium carbonate (TUMS - DOSED IN MG ELEMENTAL CALCIUM) 500 MG chewable tablet Chew 1 tablet by mouth daily as needed. (Patient not taking: Reported on 03/27/2022)    ? ciclopirox (LOPROX) 0.77 % cream Apply topically 2 (two) times daily. (Patient not taking: Reported on 03/27/2022) 90 g 0  ? lisinopril (ZESTRIL) 5 MG tablet TAKE 1 TABLET(5 MG) BY MOUTH DAILY (Patient not taking: Reported on 03/27/2022) 90 tablet 3  ? Loperamide HCl (IMODIUM PO) Take by mouth. Patient takes every 3-5 days as needed (Patient not taking: Reported on 03/27/2022)    ? metoprolol succinate (TOPROL-XL) 50 MG 24 hr tablet TAKE 1 TABLET(50 MG) BY MOUTH DAILY (Patient not taking: Reported on 03/27/2022) 90 tablet 0  ? ?No current facility-administered medications on file prior to visit.  ? ? ?Review of Systems  ?Eyes:  Negative for visual disturbance.  ?Respiratory:  Negative for shortness of breath.   ?Cardiovascular:  Negative for chest pain and palpitations.  ?Gastrointestinal:  Negative for nausea.  ?Neurological:  Positive for tremors and headaches. Negative for weakness and numbness.  ? ?   ?  Objective:  ? ?Vitals:  ? 03/27/22 0821  ?BP: (!) 150/90  ?Pulse: 69  ?Temp: 98.1 ?F (36.7 ?C)  ?SpO2: 97%  ? ?BP Readings from Last 3 Encounters:  ?03/27/22 (!) 150/90  ?09/18/21 120/68  ?03/22/20 (!) 143/92  ? ?Wt Readings from Last 3 Encounters:  ?03/27/22 132 lb 6.4 oz (60.1 kg)  ?09/18/21 130 lb 9.6 oz (59.2 kg)  ?03/22/20 135 lb (61.2 kg)  ? ?Body mass index is 27.67 kg/m?. ? ?  ?Physical Exam ?Constitutional:   ?   General: She is not in acute distress. ?   Appearance: Normal appearance.  ?HENT:  ?   Head: Normocephalic and atraumatic.  ?Eyes:  ?   Conjunctiva/sclera: Conjunctivae normal.  ?Cardiovascular:  ?   Rate and Rhythm: Normal rate and regular rhythm.  ?   Heart sounds: Normal heart  sounds. No murmur heard. ?Pulmonary:  ?   Effort: Pulmonary effort is normal. No respiratory distress.  ?   Breath sounds: Normal breath sounds. No wheezing.  ?Musculoskeletal:  ?   Cervical back: Neck supple.  ?   Right lower leg: No edema.  ?   Left lower leg: No edema.  ?Lymphadenopathy:  ?   Cervical: No cervical adenopathy.  ?Skin: ?   Findings: No rash.  ?Neurological:  ?   Mental Status: She is alert. Mental status is at baseline.  ?   Sensory: No sensory deficit.  ?   Motor: Weakness (mild b/l hand weakness - unable to make tight fist) present.  ?   Comments: No tremor at rest  ?Psychiatric:     ?   Mood and Affect: Mood normal.     ?   Behavior: Behavior normal.  ? ?   ? ? ? ? ? ?Assessment & Plan:  ? ? ? ?I spent 22 minutes dedicated to the care of this patient on the date of this encounter including review of last labs/DEXA,  speciality notes, notes documenting Prolia approval obtaining history, communicating with the patient, ordering tests, and documenting clinical information in the EHR  ? ?See Problem List for Assessment and Plan of chronic medical problems.  ? ? ? ? ?

## 2022-03-27 ENCOUNTER — Encounter: Payer: Self-pay | Admitting: Internal Medicine

## 2022-03-27 ENCOUNTER — Ambulatory Visit: Payer: Medicare PPO | Admitting: Internal Medicine

## 2022-03-27 VITALS — BP 150/90 | HR 69 | Temp 98.1°F | Ht <= 58 in | Wt 132.4 lb

## 2022-03-27 DIAGNOSIS — I10 Essential (primary) hypertension: Secondary | ICD-10-CM

## 2022-03-27 DIAGNOSIS — R251 Tremor, unspecified: Secondary | ICD-10-CM | POA: Diagnosis not present

## 2022-03-27 DIAGNOSIS — R519 Headache, unspecified: Secondary | ICD-10-CM | POA: Diagnosis not present

## 2022-03-27 DIAGNOSIS — M810 Age-related osteoporosis without current pathological fracture: Secondary | ICD-10-CM | POA: Diagnosis not present

## 2022-03-27 NOTE — Assessment & Plan Note (Signed)
Chronic ?Has not taken Prolia in over a year ?DEXA due-ordered-she would like to wait until she sees the results of this before determining if further treatment is needed ?

## 2022-03-27 NOTE — Assessment & Plan Note (Addendum)
Acute ?She states she has had a headache for 3 weeks-primarily behind the right eye and over to the right ear ?Taking Aleve or Goody powders nightly, which helps her sleep.  Occasionally taking something during the day ?Has history of migraines and this could be migraine, but also likely an element of rebound headaches ?Advise no Aleve, Goody powders or Tylenol ?Discussed steroid taper, but she deferred ?She will let me know if her headache does not resolve or improve, at which time we will pursue imaging and possibly prescription medication ?

## 2022-03-27 NOTE — Patient Instructions (Addendum)
? ? ? ?  Medications changes include :   stop the aleve, goody powders and do not take tylenol.  ? ? ? ? ?A referral was ordered for [facility] - Dr Tat.     Someone from that office will call you to schedule an appointment.  ? ? ?A bone density scan was ordered ? ? ? ?Return if symptoms worsen or fail to improve, for Schedule DEXA-[facility]. ? ?
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
## 2022-03-27 NOTE — Assessment & Plan Note (Signed)
New ?Having bilateral tremor in her hands for years-recently went out to dinner and had much worse tremor in the right hand that affected her ability to eat ?Has had a couple of episodes of head tremor ?No family history ?Tremor is not a resting tremor ?Likely benign essential tremor ?Discussed things that may make this worse ?Discussed propranolol, which can sometimes help with the tremor-she deferred for now ?She is interested in seeing neurology to confirm diagnosis-referral ordered ?

## 2022-03-27 NOTE — Assessment & Plan Note (Signed)
Chronic ?Blood pressure elevated here today-she is currently not taking any medication ?She deferred me sending in her medication-she wants to be seen by her cardiologist and be checked out prior to restarting her medication-advised that she needs to call today to make an appointment ?

## 2022-03-28 ENCOUNTER — Encounter: Payer: Self-pay | Admitting: Neurology

## 2022-03-28 ENCOUNTER — Ambulatory Visit: Payer: Medicare PPO | Admitting: Neurology

## 2022-03-28 ENCOUNTER — Other Ambulatory Visit: Payer: Self-pay

## 2022-03-28 ENCOUNTER — Other Ambulatory Visit (INDEPENDENT_AMBULATORY_CARE_PROVIDER_SITE_OTHER): Payer: Medicare PPO

## 2022-03-28 VITALS — BP 150/89 | HR 90 | Ht <= 58 in | Wt 131.6 lb

## 2022-03-28 DIAGNOSIS — E785 Hyperlipidemia, unspecified: Secondary | ICD-10-CM

## 2022-03-28 DIAGNOSIS — R251 Tremor, unspecified: Secondary | ICD-10-CM | POA: Diagnosis not present

## 2022-03-28 DIAGNOSIS — G250 Essential tremor: Secondary | ICD-10-CM

## 2022-03-28 DIAGNOSIS — I10 Essential (primary) hypertension: Secondary | ICD-10-CM | POA: Diagnosis not present

## 2022-03-28 LAB — TSH: TSH: 2.91 u[IU]/mL (ref 0.35–5.50)

## 2022-03-28 NOTE — Patient Instructions (Signed)
Your provider has requested that you have labwork completed today. The lab is located on the Second floor at [address], within the [facility] office. When you get off the elevator, turn right and go in the [facility] [address]; the first brown door on the left.  Tell the ladies behind the desk that you are there for lab work. If you are not called within 15 minutes please check with the front desk.   Once you complete your labs you are free to go. You will receive a call or message via MyChart with your lab results.     The physicians and staff at [facility] are committed to providing excellent care. You may receive a survey requesting feedback about your experience at our office. We strive to receive "very good" responses to the survey questions. If you feel that your experience would prevent you from giving the office a "very good " response, please contact our office to try to remedy the situation. We may be reached at [phone]. Thank you for taking the time out of your busy day to complete the survey.  

## 2022-04-12 ENCOUNTER — Telehealth: Payer: Self-pay | Admitting: Internal Medicine

## 2022-04-12 ENCOUNTER — Other Ambulatory Visit: Payer: Self-pay

## 2022-04-12 MED ORDER — LISINOPRIL 5 MG PO TABS: ORAL_TABLET | ORAL | 0 refills | Status: DC

## 2022-04-12 MED ORDER — METOPROLOL SUCCINATE ER 50 MG PO TB24
ORAL_TABLET | ORAL | 0 refills | Status: DC
Start: 2022-04-12 — End: 2022-07-25

## 2022-04-12 NOTE — Telephone Encounter (Signed)
Spoke with patient today. ? ?Meds sent in and she has follow up with Dr. Debara Pickett in July. ? ?She has been advised to keep log of blood pressure readings (since she has been off of meds for quite some time) and follow up with [place] if readings continue to be elevated.  ?

## 2022-04-12 NOTE — Telephone Encounter (Signed)
Pt states her bp is elevated and requesting a refill. ? ?Pt states she wants the provider "to use her best judgement on which medication needs to be refilled" ? ?Advise pt in order to submit a refill request, I would need the name of the medication, pt did not have the information ? ?Pt requesting a cb  ?

## 2022-04-15 ENCOUNTER — Encounter: Payer: Self-pay | Admitting: Internal Medicine

## 2022-04-22 NOTE — Progress Notes (Signed)
? ? ? ? ?Subjective:  ? ? Patient ID: Regina Ramsey, female    DOB: 05-Jan-1941, 81 y.o.   MRN: [id number] ? ?This visit occurred during the SARS-CoV-2 public health emergency.  Safety protocols were in place, including screening questions prior to the visit, additional usage of staff PPE, and extensive cleaning of exam room while observing appropriate contact time as indicated for disinfecting solutions.   ? ? ?HPI ?Regina Ramsey is here for follow up of her chronic medical problems, including htn ? ?She is taking her medications as prescribed.  Her BP is variable.  164/77 - 100/64 ? ?Her blood pressure did improve after she restarted her blood pressure medications.  She denies any side effects from medications. ? ?Medications and allergies reviewed with patient and updated if appropriate. ? ?Current Outpatient Medications on File Prior to Visit  ?Medication Sig Dispense Refill  ? aspirin EC 81 MG tablet Take 81 mg by mouth daily.    ? calcium carbonate (TUMS - DOSED IN MG ELEMENTAL CALCIUM) 500 MG chewable tablet Chew 1 tablet by mouth daily as needed.    ? lisinopril (ZESTRIL) 5 MG tablet TAKE 1 TABLET(5 MG) BY MOUTH DAILY 90 tablet 0  ? Loperamide HCl (IMODIUM PO) Take by mouth. Patient takes every 3-5 days as needed    ? metoprolol succinate (TOPROL-XL) 50 MG 24 hr tablet TAKE 1 TABLET(50 MG) BY MOUTH DAILY 90 tablet 0  ? atorvastatin (LIPITOR) 40 MG tablet TAKE 1 TABLET(40 MG) BY MOUTH DAILY AT 6 PM (Patient not taking: Reported on 03/27/2022) 90 tablet 3  ? ?No current facility-administered medications on file prior to visit.  ? ? ? ?Review of Systems  ?Respiratory:  Negative for shortness of breath.   ?Cardiovascular:  Positive for chest pain (two episodes of very transient ( 1 sec) sharp pain) and leg swelling (1 week ago). Negative for palpitations.  ?Neurological:  Negative for dizziness, light-headedness and headaches.  ? ?   ?Objective:  ? ?Vitals:  ? 04/23/22 1011  ?BP: (!) 146/88  ?Pulse: 60  ?Temp: 98.1  ?F (36.7 ?C)  ?SpO2: 97%  ? ?BP Readings from Last 3 Encounters:  ?04/23/22 (!) 146/88  ?03/28/22 (!) 150/89  ?03/27/22 (!) 150/90  ? ?Wt Readings from Last 3 Encounters:  ?04/23/22 129 lb (58.5 kg)  ?03/28/22 131 lb 9.6 oz (59.7 kg)  ?03/27/22 132 lb 6.4 oz (60.1 kg)  ? ?Body mass index is 26.96 kg/m?. ? ?  ?Physical Exam ?Constitutional:   ?   General: She is not in acute distress. ?   Appearance: Normal appearance.  ?HENT:  ?   Head: Normocephalic and atraumatic.  ?Eyes:  ?   Conjunctiva/sclera: Conjunctivae normal.  ?Cardiovascular:  ?   Rate and Rhythm: Normal rate and regular rhythm.  ?   Heart sounds: Normal heart sounds. No murmur heard. ?Pulmonary:  ?   Effort: Pulmonary effort is normal. No respiratory distress.  ?   Breath sounds: Normal breath sounds. No wheezing.  ?Musculoskeletal:  ?   Cervical back: Neck supple.  ?   Right lower leg: No edema.  ?   Left lower leg: No edema.  ?Lymphadenopathy:  ?   Cervical: No cervical adenopathy.  ?Skin: ?   General: Skin is warm and dry.  ?   Findings: No rash.  ?Neurological:  ?   Mental Status: She is alert. Mental status is at baseline.  ?Psychiatric:     ?   Mood and Affect: Mood normal.     ?  Behavior: Behavior normal.  ? ?   ? ?Lab Results  ?Component Value Date  ? WBC 6.6 09/18/2021  ? HGB 13.2 09/18/2021  ? HCT 40.5 09/18/2021  ? PLT 239.0 09/18/2021  ? GLUCOSE 130 (H) 09/18/2021  ? CHOL 119 07/17/2019  ? TRIG 97.0 07/17/2019  ? HDL 40.50 07/17/2019  ? LDLDIRECT 193.0 07/13/2010  ? LDLCALC 59 07/17/2019  ? ALT 17 09/18/2021  ? AST 20 09/18/2021  ? NA 144 09/18/2021  ? K 4.0 09/18/2021  ? CL 107 09/18/2021  ? CREATININE 0.96 09/18/2021  ? BUN 22 09/18/2021  ? CO2 27 09/18/2021  ? TSH 2.91 03/28/2022  ? HGBA1C 6.0 09/18/2021  ? ? ? ?Assessment & Plan:  ? ? ?See Problem List for Assessment and Plan of chronic medical problems.  ? ? ?

## 2022-04-23 ENCOUNTER — Ambulatory Visit: Payer: Medicare PPO | Admitting: Internal Medicine

## 2022-04-23 ENCOUNTER — Encounter: Payer: Self-pay | Admitting: Internal Medicine

## 2022-04-23 VITALS — BP 146/88 | HR 60 | Temp 98.1°F | Ht <= 58 in | Wt 129.0 lb

## 2022-04-23 DIAGNOSIS — I10 Essential (primary) hypertension: Secondary | ICD-10-CM | POA: Diagnosis not present

## 2022-04-23 MED ORDER — LISINOPRIL 5 MG PO TABS: 10.0000 mg | ORAL_TABLET | Freq: Every day | ORAL | 0 refills | Status: DC

## 2022-04-23 NOTE — Assessment & Plan Note (Signed)
Chronic ?She did restart her medications, but BP is still higher than ideal, but variable ?Will increase lisinopril to 10 mg daily ?Continue metoprolol XL 50 mg daily ?Discussed with her that we want her blood pressure better controlled, but not too low ?She will continue to monitor her BP at home ?Has an appointment to see Dr. Debara Pickett in July ?

## 2022-04-23 NOTE — Patient Instructions (Addendum)
? ? ?  ? ? ?  Medications changes include :   increase lisinopril to 10 mg daily ? ? ? ?Your prescription(s) have been sent to your pharmacy.  ? ? ? ? ?Return in about 1 year (around 04/24/2023) for Schedule D[facility]-[place], Physical Exam. ? ?

## 2022-05-08 ENCOUNTER — Ambulatory Visit (INDEPENDENT_AMBULATORY_CARE_PROVIDER_SITE_OTHER)
Admission: RE | Admit: 2022-05-08 | Discharge: 2022-05-08 | Disposition: A | Discharge status: Home / Self Care | Payer: Medicare PPO | Source: Ambulatory Visit | Attending: Internal Medicine | Admitting: Internal Medicine

## 2022-05-08 DIAGNOSIS — M810 Age-related osteoporosis without current pathological fracture: Secondary | ICD-10-CM

## 2022-07-19 ENCOUNTER — Telehealth: Payer: Self-pay

## 2022-07-19 ENCOUNTER — Ambulatory Visit (INDEPENDENT_AMBULATORY_CARE_PROVIDER_SITE_OTHER): Payer: Medicare PPO

## 2022-07-19 DIAGNOSIS — Z0000 Encounter for general adult medical examination without abnormal findings: Secondary | ICD-10-CM | POA: Diagnosis not present

## 2022-07-19 NOTE — Progress Notes (Signed)
I connected with Regina Ramsey today by telephone and verified that I am speaking with the correct person using two identifiers. Location patient: home Location provider: work Persons participating in the virtual visit: patient, provider.   I discussed the limitations, risks, security and privacy concerns of performing an evaluation and management service by telephone and the availability of in person appointments. I also discussed with the patient that there may be a patient responsible charge related to this service. The patient expressed understanding and verbally consented to this telephonic visit.    Interactive audio and video telecommunications were attempted between this provider and patient, however failed, due to patient having technical difficulties OR patient did not have access to video capability.  We continued and completed visit with audio only.  Some vital signs may be absent or patient reported.   Time Spent with patient on telephone encounter: 30 minutes  Subjective:   Regina Ramsey is a 81 y.o. female who presents for Medicare Annual (Subsequent) preventive examination.  Review of Systems     Cardiac Risk Factors include: advanced age (>63mn, >>80women);dyslipidemia;family history of premature cardiovascular disease;hypertension     Objective:    There were no vitals filed for this visit. There is no height or weight on file to calculate BMI.     07/19/2022    2:29 PM 03/28/2022    8:50 AM 11/16/2019    1:32 PM 10/18/2016   11:46 AM  Advanced Directives  Does Patient Have a Medical Advance Directive? Yes Yes Yes Yes  Type of Advance Directive Living will;Healthcare Power of Attorney Living will Living will;[place]  Does patient want to make changes to medical advance directive? No - Patient declined   No - Patient declined  [place] of [place]? No - copy requested  No  - copy requested, Physician notified No - copy requested    Current Medications (verified) Outpatient Encounter Medications as of 07/19/2022  Medication Sig   aspirin EC 81 MG tablet Take 81 mg by mouth daily.   calcium carbonate (TUMS - DOSED IN MG ELEMENTAL CALCIUM) 500 MG chewable tablet Chew 1 tablet by mouth daily as needed.   lisinopril (ZESTRIL) 5 MG tablet Take 2 tablets (10 mg total) by mouth daily. TAKE 1 TABLET(5 MG) BY MOUTH DAILY   Loperamide HCl (IMODIUM PO) Take by mouth. Patient takes every 3-5 days as needed   metoprolol succinate (TOPROL-XL) 50 MG 24 hr tablet TAKE 1 TABLET(50 MG) BY MOUTH DAILY   atorvastatin (LIPITOR) 40 MG tablet TAKE 1 TABLET(40 MG) BY MOUTH DAILY AT 6 PM (Patient not taking: Reported on 03/27/2022)   No facility-administered encounter medications on file as of 07/19/2022.    Allergies (verified) Short ragweed pollen ext   History: Past Medical History:  Diagnosis Date   ALLERGIC RHINITIS    ANEMIA-NOS    Anxiety    Asthma    Triggered by allergies   COPD    normal PFTs 06/22/11   Diverticulosis of colon    Dyspareunia    Endometriosis    GERD    HEARING LOSS 07/13/2010   HYPERLIPIDEMIA    HYPERTENSION    Irritable bowel syndrome    MIGRAINE HEADACHE    OSTEOPOROSIS    on Prolia q 65mohx vertebral fx   PALPITATIONS, HX OF    Vaginal atrophy    Past Surgical History:  Procedure Laterality Date   ABDOMINAL  HYSTERECTOMY  12/31/1972   APPENDECTOMY  12/31/1956   BREAST SURGERY  01/01/1984   breast biopsy   CATARACT EXTRACTION, BILATERAL     LEFT HEART CATH AND CORONARY ANGIOGRAPHY N/A 12/03/2018   Procedure: LEFT HEART CATH AND CORONARY ANGIOGRAPHY;  Surgeon: Troy Sine, MD;  Location: [facility];  Service: Cardiovascular;  Laterality: N/A;   Family History  Adopted: Yes  Problem Relation Age of Onset   Arthritis Mother    Breast cancer Mother    Cancer Mother        started on her spine met cancer   Bone cancer  Mother    Arthritis Father    Osteoarthritis Sister    Cancer - Prostate Brother    Cancer Brother    Hyperlipidemia Maternal Grandmother    Other Son        tachycardia   Breast cancer Maternal Aunt    Diabetes Other        parent & grandparent   Colon cancer Neg Hx    Colon polyps Neg Hx    Social History   Socioeconomic History   Marital status: Married    Spouse name: Not on file   Number of children: 2   Years of education: Not on file   Highest education level: Not on file  Occupational History   Occupation: Retired   Occupation: retired    Comment: Pharmacist, hospital  Tobacco Use   Smoking status: Former    Types: Cigarettes    Quit date: 01/01/1964    Years since quitting: 58.5   Smokeless tobacco: Never   Tobacco comments:    Married, lives with souse. Master of education-specialist needs for grade school  Substance and Sexual Activity   Alcohol use: Yes    Alcohol/week: 2.0 standard drinks of alcohol    Types: 2 Standard drinks or equivalent per week    Comment: Social   Drug use: No   Sexual activity: Not on file  Other Topics Concern   Not on file  Social History Narrative   Retired Pharmacist, hospital      No regular exercise, active      epworth sleepiness scale = 10 (05/30/16)   Social Determinants of Health   Financial Resource Strain: Low Risk  (07/19/2022)   Overall Financial Resource Strain (CARDIA)    Difficulty of Paying Living Expenses: Not hard at all  Food Insecurity: No Food Insecurity (07/19/2022)   Hunger Vital Sign    Worried About Running Out of Food in the Last Year: Never true    Ran Out of Food in the Last Year: Never true  Transportation Needs: No Transportation Needs (07/19/2022)   PRAPARE - Hydrologist (Medical): No    Lack of Transportation (Non-Medical): No  Physical Activity: Sufficiently Active (07/19/2022)   Exercise Vital Sign    Days of Exercise per Week: 4 days    Minutes of Exercise per Session: 60 min   Stress: No Stress Concern Present (07/19/2022)   [place]    Feeling of Stress : Not at all  Social Connections: [place] (07/19/2022)   Social Connection and Isolation Panel [NHANES]    Frequency of Communication with Friends and Family: More than three times a week    Frequency of Social Gatherings with Friends and Family: More than three times a week    Attends Religious Services: More than 4 times per year  Active Member of Clubs or Organizations: Yes    Attends Archivist Meetings: More than 4 times per year    Marital Status: Married    Tobacco Counseling Counseling given: Not Answered Tobacco comments: Married, lives with souse. Master of education-specialist needs for grade school   Clinical Intake:  Pre-visit preparation completed: Yes  Pain : No/denies pain     BMI - recorded: 26.97 Nutritional Status: BMI 25 -29 Overweight Nutritional Risks: None Diabetes: No  How often do you need to have someone help you when you read instructions, pamphlets, or other written materials from your doctor or pharmacy?: 1 - Never What is the last grade level you completed in school?: Master's Degree  Diabetic? no  Interpreter Needed?: No  Information entered by :: [employer], LPN.   Activities of Daily Living    07/19/2022    3:44 PM 09/18/2021    3:48 PM  In your present state of health, do you have any difficulty performing the following activities:  Hearing? 1 0  Vision? 0 0  Difficulty concentrating or making decisions? 0 0  Walking or climbing stairs? 0 0  Dressing or bathing? 0 0  Doing errands, shopping? 0 0  Preparing Food and eating ? N   Using the Toilet? N   In the past six months, have you accidently leaked urine? Y   Do you have problems with loss of bowel control? Y   Managing your Medications? N   Managing your Finances? N   Housekeeping or managing your  Housekeeping? N     Patient Care Team: Binnie Rail, MD as PCP - General (Internal Medicine) Wonda Horner, MD (Gastroenterology) Lavonna Monarch, MD (Dermatology) Debara Pickett Nadean Corwin, MD as Consulting Physician (Cardiology) Inda Castle, MD (Inactive) as Consulting Physician (Gastroenterology) Lonzo Candy, AUD (Audiology) Marica Otter, OD (Optometry) Alanda Slim, Neena Rhymes, MD as Consulting Physician (Ophthalmology)  Indicate any recent Medical Services you may have received from other than [facility] providers in the past year (date may be approximate).     Assessment:   This is a routine wellness examination for Regina Ramsey.  Hearing/Vision screen Hearing Screening - Comments:: Patient has decreased hearing; has hearing aids. Refuses to wear hearing aids. Vision Screening - Comments:: Patient wears readers for small print. Lens implants done. Eye exam done by: Julian Reil, MD.  Dietary issues and exercise activities discussed: Current Exercise Habits: Home exercise routine, Type of exercise: walking, Time (Minutes): 60, Frequency (Times/Week): 4, Weekly Exercise (Minutes/Week): 240, Intensity: Moderate, Exercise limited by: None identified   Goals Addressed             This Visit's Progress    My goal is to keep being seen instead of viewed.        Depression Screen    07/19/2022    2:32 PM 04/23/2022   10:22 AM 03/27/2022    8:28 AM 09/18/2021    7:25 PM 08/18/2019    1:53 PM 05/08/2018    7:51 AM 10/18/2016   11:47 AM  PHQ 2/9 Scores  PHQ - 2 Score 0 0 0 0 0 0 0  PHQ- 9 Score    2       Fall Risk    07/19/2022    2:30 PM 04/23/2022   10:22 AM 03/28/2022    8:50 AM 03/27/2022    8:28 AM 09/18/2021    3:44 PM  Fall Risk   Falls in the past year? 1 1 0 1  0  Number falls in past yr: 0 1 0 1 0  Injury with Fall? 0 0 0 0 0  Risk for fall due to : No Fall Risks No Fall Risks  No Fall Risks No Fall Risks  Follow up Falls evaluation completed Falls evaluation  completed  Falls evaluation completed Falls evaluation completed    [place]:  Any stairs in or around the home? Yes  If so, are there any without handrails? No  Home free of loose throw rugs in walkways, pet beds, electrical cords, etc? Yes  Adequate lighting in your home to reduce risk of falls? Yes   ASSISTIVE DEVICES UTILIZED TO PREVENT FALLS:  Life alert? No  Use of a cane, walker or w/c? No  Grab bars in the bathroom? Yes  Shower chair or bench in shower? Yes  Elevated toilet seat or a handicapped toilet? No   TIMED UP AND GO:  Was the test performed? No .  Length of time to ambulate 10 feet: n/a sec.   Appearance of gait: Gait not evaluated during this visit.  Cognitive Function:    10/18/2016   11:49 AM  MMSE - Mini Mental State Exam  Orientation to time 5  Orientation to Place 5  Registration 3  Attention/ Calculation 5  Recall 3  Language- name 2 objects 2  Language- repeat 1  Language- follow 3 step command 3  Language- read & follow direction 1  Write a sentence 1  Copy design 1  Total score 30        07/19/2022    3:45 PM  6CIT Screen  What Year? 0 points  What month? 0 points  What time? 0 points  Count back from 20 0 points  Months in reverse 0 points  Repeat phrase 0 points  Total Score 0 points    Immunizations Immunization History  Administered Date(s) Administered   Fluad Quad(high Dose 65+) 09/11/2019, 09/12/2020, 09/18/2021   Influenza, High Dose Seasonal PF 09/17/2018   Influenza,inj,quad, With Preservative 10/01/2019   Influenza-Unspecified 10/16/2018   PFIZER(Purple Top)SARS-COV-2 Vaccination 02/13/2020, 03/06/2020, 10/18/2020   Pneumococcal Conjugate-13 10/11/2015   Pneumococcal Polysaccharide-23 02/07/2012   Td 06/30/2009   Tdap 07/19/2015   Zoster, Live 03/30/2014    TDAP status: Up to date  Flu Vaccine status: Up to date  Pneumococcal vaccine status: Up to date  Covid-19 vaccine  status: Completed vaccines  Qualifies for Shingles Vaccine? Yes   Zostavax completed Yes   Shingrix Completed?: No.    Education has been provided regarding the importance of this vaccine. Patient has been advised to call insurance company to determine out of pocket expense if they have not yet received this vaccine. Advised may also receive vaccine at local pharmacy or Health Dept. Verbalized acceptance and understanding.  Screening Tests Health Maintenance  Topic Date Due   Zoster Vaccines- Shingrix (1 of 2) Never done   COLONOSCOPY (Pts 45-23yr Insurance coverage will need to be confirmed)  02/11/2020   COVID-19 Vaccine (4 - Booster for Pfizer series) 12/13/2020   INFLUENZA VACCINE  07/31/2022   DEXA SCAN  05/08/2024   TETANUS/TDAP  07/18/2025   Pneumonia Vaccine 81 Years old  Completed   HPV VACCINES  Aged Out    Health Maintenance  Health Maintenance Due  Topic Date Due   Zoster Vaccines- Shingrix (1 of 2) Never done   COLONOSCOPY (Pts 45-448yrInsurance coverage will need to be confirmed)  02/11/2020  COVID-19 Vaccine (4 - Booster for Pfizer series) 12/13/2020    Colorectal cancer screening: Type of screening: Colonoscopy. Completed 02/10/2015. Repeat every 10 years/  Mammogram status: No longer required due to patient refusal.  Bone Density status: Completed 05/08/2022. Results reflect: Bone density results: OSTEOPOROSIS. Repeat every 2 years.  Lung Cancer Screening: (Low Dose CT Chest recommended if Age 35-80 years, 30 pack-year currently smoking OR have quit w/in 15years.) does not qualify.   Lung Cancer Screening Referral: no  Additional Screening:  Hepatitis C Screening: does not qualify; Completed no  Vision Screening: Recommended annual ophthalmology exams for early detection of glaucoma and other disorders of the eye. Is the patient up to date with their annual eye exam?  Yes  Who is the provider or what is the name of the office in which the patient attends  annual eye exams? Julian Reil, MD. If pt is not established with a provider, would they like to be referred to a provider to establish care? No .   Dental Screening: Recommended annual dental exams for proper oral hygiene  Community Resource Referral / Chronic Care Management: CRR required this visit?  No   CCM required this visit?  No      Plan:     I have personally reviewed and noted the following in the patient's chart:   Medical and social history Use of alcohol, tobacco or illicit drugs  Current medications and supplements including opioid prescriptions.  Functional ability and status Nutritional status Physical activity Advanced directives List of other physicians Hospitalizations, surgeries, and ER visits in previous 12 months Vitals Screenings to include cognitive, depression, and falls Referrals and appointments  In addition, I have reviewed and discussed with patient certain preventive protocols, quality metrics, and best practice recommendations. A written personalized care plan for preventive services as well as general preventive health recommendations were provided to patient.     Sheral Flow, LPN   05/18/8415   Nurse Notes:  There were no vitals filed for this visit. There is no height or weight on file to calculate BMI. Patient stated that she has no issues with gait or balance; does not use any assistive devices. Medications reviewed with patient; no opioid use noted.

## 2022-07-19 NOTE — Patient Instructions (Signed)
Regina Ramsey , Thank you for taking time to come for your Medicare Wellness Visit. I appreciate your ongoing commitment to your health goals. Please review the following plan we discussed and let me know if I can assist you in the future.   Screening recommendations/referrals: Colonoscopy: Last done 02/10/2015 Mammogram: Last done 04/05/2015 Bone Density: Last done 05/08/2022; due every 2 years Recommended yearly ophthalmology/optometry visit for glaucoma screening and checkup Recommended yearly dental visit for hygiene and checkup  Vaccinations: Influenza vaccine: 09/18/2021 Pneumococcal vaccine: 02/07/2012, 10/11/2015 Tdap vaccine: 07/19/2015; due every 10 years Shingles vaccine: never done   Covid-19: 02/13/2020, 03/06/2020, 10/18/2020  Advanced directives: Yes; Please bring a copy of your health care power of attorney and living will to the office at your convenience.  Conditions/risks identified: Yes; Client understands the importance of follow-up appointments with providers by attending scheduled visits and discussed goals to eat healthier, increase physical activity 5 times a week for 30 minutes each, exercise the brain by doing stimulating brain exercises (reading, adult coloring, crafting, listening to music, puzzles, etc.), socialize and enjoy life more, get enough sleep at least 8-9 hours average per night and make time for laughter.  Next appointment: Please schedule your next Medicare Wellness Visit with your Nurse Health Advisor in 1 year by calling [phone].   Preventive Care 42 Years and Older, Female Preventive care refers to lifestyle choices and visits with your health care provider that can promote health and wellness. What does preventive care include? A yearly physical exam. This is also called an annual well check. Dental exams once or twice a year. Routine eye exams. Ask your health care provider how often you should have your eyes checked. Personal lifestyle choices,  including: Daily care of your teeth and gums. Regular physical activity. Eating a healthy diet. Avoiding tobacco and drug use. Limiting alcohol use. Practicing safe sex. Taking low-dose aspirin every day. Taking vitamin and mineral supplements as recommended by your health care provider. What happens during an annual well check? The services and screenings done by your health care provider during your annual well check will depend on your age, overall health, lifestyle risk factors, and family history of disease. Counseling  Your health care provider may ask you questions about your: Alcohol use. Tobacco use. Drug use. Emotional well-being. Home and relationship well-being. Sexual activity. Eating habits. History of falls. Memory and ability to understand (cognition). Work and work Statistician. Reproductive health. Screening  You may have the following tests or measurements: Height, weight, and BMI. Blood pressure. Lipid and cholesterol levels. These may be checked every 5 years, or more frequently if you are over 31 years old. Skin check. Lung cancer screening. You may have this screening every year starting at age 23 if you have a 30-pack-year history of smoking and currently smoke or have quit within the past 15 years. Fecal occult blood test (FOBT) of the stool. You may have this test every year starting at age 11. Flexible sigmoidoscopy or colonoscopy. You may have a sigmoidoscopy every 5 years or a colonoscopy every 10 years starting at age 23. Hepatitis C blood test. Hepatitis B blood test. Sexually transmitted disease (STD) testing. Diabetes screening. This is done by checking your blood sugar (glucose) after you have not eaten for a while (fasting). You may have this done every 1-3 years. Bone density scan. This is done to screen for osteoporosis. You may have this done starting at age 59. Mammogram. This may be done every 1-2 years. Talk  to your health care provider  about how often you should have regular mammograms. Talk with your health care provider about your test results, treatment options, and if necessary, the need for more tests. Vaccines  Your health care provider may recommend certain vaccines, such as: Influenza vaccine. This is recommended every year. Tetanus, diphtheria, and acellular pertussis (Tdap, Td) vaccine. You may need a Td booster every 10 years. Zoster vaccine. You may need this after age 53. Pneumococcal 13-valent conjugate (PCV13) vaccine. One dose is recommended after age 60. Pneumococcal polysaccharide (PPSV23) vaccine. One dose is recommended after age 43. Talk to your health care provider about which screenings and vaccines you need and how often you need them. This information is not intended to replace advice given to you by your health care provider. Make sure you discuss any questions you have with your health care provider. Document Released: 01/13/2016 Document Revised: 09/05/2016 Document Reviewed: 10/18/2015 Elsevier Interactive Patient Education  2017 [place] Prevention in the Home Falls can cause injuries. They can happen to people of all ages. There are many things you can do to make your home safe and to help prevent falls. What can I do on the outside of my home? Regularly fix the edges of walkways and driveways and fix any cracks. Remove anything that might make you trip as you walk through a door, such as a raised step or threshold. Trim any bushes or trees on the path to your home. Use bright outdoor lighting. Clear any walking paths of anything that might make someone trip, such as rocks or tools. Regularly check to see if handrails are loose or broken. Make sure that both sides of any steps have handrails. Any raised decks and porches should have guardrails on the edges. Have any leaves, snow, or ice cleared regularly. Use sand or salt on walking paths during winter. Clean up any spills in  your garage right away. This includes oil or grease spills. What can I do in the bathroom? Use night lights. Install grab bars by the toilet and in the tub and shower. Do not use towel bars as grab bars. Use non-skid mats or decals in the tub or shower. If you need to sit down in the shower, use a plastic, non-slip stool. Keep the floor dry. Clean up any water that spills on the floor as soon as it happens. Remove soap buildup in the tub or shower regularly. Attach bath mats securely with double-sided non-slip rug tape. Do not have throw rugs and other things on the floor that can make you trip. What can I do in the bedroom? Use night lights. Make sure that you have a light by your bed that is easy to reach. Do not use any sheets or blankets that are too big for your bed. They should not hang down onto the floor. Have a firm chair that has side arms. You can use this for support while you get dressed. Do not have throw rugs and other things on the floor that can make you trip. What can I do in the kitchen? Clean up any spills right away. Avoid walking on wet floors. Keep items that you use a lot in easy-to-reach places. If you need to reach something above you, use a strong step stool that has a grab bar. Keep electrical cords out of the way. Do not use floor polish or wax that makes floors slippery. If you must use wax, use non-skid floor wax. Do  not have throw rugs and other things on the floor that can make you trip. What can I do with my stairs? Do not leave any items on the stairs. Make sure that there are handrails on both sides of the stairs and use them. Fix handrails that are broken or loose. Make sure that handrails are as long as the stairways. Check any carpeting to make sure that it is firmly attached to the stairs. Fix any carpet that is loose or worn. Avoid having throw rugs at the top or bottom of the stairs. If you do have throw rugs, attach them to the floor with carpet  tape. Make sure that you have a light switch at the top of the stairs and the bottom of the stairs. If you do not have them, ask someone to add them for you. What else can I do to help prevent falls? Wear shoes that: Do not have high heels. Have rubber bottoms. Are comfortable and fit you well. Are closed at the toe. Do not wear sandals. If you use a stepladder: Make sure that it is fully opened. Do not climb a closed stepladder. Make sure that both sides of the stepladder are locked into place. Ask someone to hold it for you, if possible. Clearly mark and make sure that you can see: Any grab bars or handrails. First and last steps. Where the edge of each step is. Use tools that help you move around (mobility aids) if they are needed. These include: Canes. Walkers. Scooters. Crutches. Turn on the lights when you go into a dark area. Replace any light bulbs as soon as they burn out. Set up your furniture so you have a clear path. Avoid moving your furniture around. If any of your floors are uneven, fix them. If there are any pets around you, be aware of where they are. Review your medicines with your doctor. Some medicines can make you feel dizzy. This can increase your chance of falling. Ask your doctor what other things that you can do to help prevent falls. This information is not intended to replace advice given to you by your health care provider. Make sure you discuss any questions you have with your health care provider. Document Released: 10/13/2009 Document Revised: 05/24/2016 Document Reviewed: 01/21/2015 Elsevier Interactive Patient Education  2017 [employer].

## 2022-07-23 NOTE — Progress Notes (Unsigned)
Virtual Visit via Video Note  I connected with Regina Ramsey on 07/24/22 at  8:30 AM EDT by a video enabled telemedicine application and verified that I am speaking with the correct person using two identifiers.   I discussed the limitations of evaluation and management by telemedicine and the availability of in person appointments. The patient expressed understanding and agreed to proceed.  Present for the visit:  Myself, Dr Billey Gosling, Joaquim Lai Littler.  The patient is currently at home and I am in the office.    No referring provider.    History of Present Illness: This is an acute visit to discuss referral for endoscopy and colonoscopy.   She has urgency for bowels.  She has had episodes of severe constipation.  This alternates with diarrhea.  She does take Imodium.  She has tried taking fiber in the past and it has not helped.  She does note some yellow stool and mucus in the stool.  She denies blood in the stool.  She has heartburn after every meal.  She is taking Tums as needed.  She has been on pantoprazole and omeprazole in the past and states they did not help.  She does note some difficulty swallowing at times.  Last colonoscopy was 2016 by Dr. Bernarda Caffey did have a polyp removed.  Transverse proximal colon.  At that time repeat colonoscopy was recommended after 5 years.  She did have chart review in 2021 and colonoscopy was not recommended.  Review of Systems  HENT:         Has dysphasia  Gastrointestinal:  Positive for abdominal pain (cramping prior to BM only), constipation, diarrhea and heartburn (every meal). Negative for blood in stool, melena, nausea and vomiting.       Bowel urgency.  Some mucus in the stool.  Stool is yellow.       Social History   Socioeconomic History   Marital status: Married    Spouse name: Not on file   Number of children: 2   Years of education: Not on file   Highest education level: Not on file  Occupational History   Occupation:  Retired   Occupation: retired    Comment: Pharmacist, hospital  Tobacco Use   Smoking status: Former    Types: Cigarettes    Quit date: 01/01/1964    Years since quitting: 58.6   Smokeless tobacco: Never   Tobacco comments:    Married, lives with souse. Master of education-specialist needs for grade school  Substance and Sexual Activity   Alcohol use: Yes    Alcohol/week: 2.0 standard drinks of alcohol    Types: 2 Standard drinks or equivalent per week    Comment: Social   Drug use: No   Sexual activity: Not on file  Other Topics Concern   Not on file  Social History Narrative   Retired Pharmacist, hospital      No regular exercise, active      epworth sleepiness scale = 10 (05/30/16)   Social Determinants of Health   Financial Resource Strain: Low Risk  (07/19/2022)   Overall Financial Resource Strain (CARDIA)    Difficulty of Paying Living Expenses: Not hard at all  Food Insecurity: No Food Insecurity (07/19/2022)   Hunger Vital Sign    Worried About Running Out of Food in the Last Year: Never true    Ran Out of Food in the Last Year: Never true  Transportation Needs: No Transportation Needs (07/19/2022)   PRAPARE - Transportation  Lack of Transportation (Medical): No    Lack of Transportation (Non-Medical): No  Physical Activity: Sufficiently Active (07/19/2022)   Exercise Vital Sign    Days of Exercise per Week: 4 days    Minutes of Exercise per Session: 60 min  Stress: No Stress Concern Present (07/19/2022)   [place]    Feeling of Stress : Not at all  Social Connections: [place] (07/19/2022)   Social Connection and Isolation Panel [NHANES]    Frequency of Communication with Friends and Family: More than three times a week    Frequency of Social Gatherings with Friends and Family: More than three times a week    Attends Religious Services: More than 4 times per year    Active Member of [employer] or Organizations: Yes     Attends Music therapist: More than 4 times per year    Marital Status: Married     Observations/Objective: Appears well in NAD   Assessment and Plan:  See Problem List for Assessment and Plan of chronic medical problems.   Follow Up Instructions:    I discussed the assessment and treatment plan with the patient. The patient was provided an opportunity to ask questions and all were answered. The patient agreed with the plan and demonstrated an understanding of the instructions.   The patient was advised to call back or seek an in-person evaluation if the symptoms worsen or if the condition fails to improve as anticipated.    Binnie Rail, MD

## 2022-07-23 NOTE — Telephone Encounter (Signed)
Pt ready for scheduling on or after 03/13/21   Out-of-pocket cost due at time of visit: $0.00   Primary: [employer] Prolia co-insurance: 0% Admin fee co-insurance: 0%    Secondary: n/a Prolia co-insurance:  Admin fee co-insurance:    Deductible: does not apply   Prior Auth: APPROVED PA# [id number] Valid 08/08/20-12/30/21   PA# [id number] Valid: 12/31/21-12/30/22     ** This summary of benefits is an estimation of the patient's out-of-pocket cost. Exact cost may very based on individual plan coverage.

## 2022-07-23 NOTE — Telephone Encounter (Signed)
Review.

## 2022-07-24 ENCOUNTER — Encounter: Payer: Self-pay | Admitting: Internal Medicine

## 2022-07-24 ENCOUNTER — Encounter: Payer: Self-pay | Admitting: Gastroenterology

## 2022-07-24 ENCOUNTER — Telehealth (INDEPENDENT_AMBULATORY_CARE_PROVIDER_SITE_OTHER): Payer: Medicare PPO | Admitting: Internal Medicine

## 2022-07-24 DIAGNOSIS — K582 Mixed irritable bowel syndrome: Secondary | ICD-10-CM

## 2022-07-24 DIAGNOSIS — K219 Gastro-esophageal reflux disease without esophagitis: Secondary | ICD-10-CM | POA: Diagnosis not present

## 2022-07-24 MED ORDER — PANTOPRAZOLE SODIUM 40 MG PO TBEC: 40.0000 mg | DELAYED_RELEASE_TABLET | Freq: Every day | ORAL | 3 refills | Status: DC

## 2022-07-24 NOTE — Assessment & Plan Note (Signed)
Chronic Uncontrolled States she has GERD after every meal Taking Tums as needed States she has been on daily medication such as pantoprazole and omeprazole in the past and they have not helped Does have some dysphagia Start pantoprazole 40 mg daily-advised to take 30 minutes prior to meals Can take Tums as needed Referral ordered to GI for possible EGD

## 2022-07-24 NOTE — Assessment & Plan Note (Signed)
Chronic History of IBS Alternating constipation and diarrhea A while ago had a severe episode of constipation which was related to her taking Imodium daily for diarrhea Has urgency, mucus in the stool and states now her stools are yellow Abdominal cramping before BM Will refer to GI for further evaluation

## 2022-07-25 ENCOUNTER — Encounter: Payer: Self-pay | Admitting: Internal Medicine

## 2022-07-25 ENCOUNTER — Ambulatory Visit: Payer: Medicare PPO | Admitting: Internal Medicine

## 2022-07-25 VITALS — BP 146/84 | HR 81 | Ht <= 58 in | Wt 133.6 lb

## 2022-07-25 DIAGNOSIS — I447 Left bundle-branch block, unspecified: Secondary | ICD-10-CM

## 2022-07-25 DIAGNOSIS — E782 Mixed hyperlipidemia: Secondary | ICD-10-CM | POA: Diagnosis not present

## 2022-07-25 DIAGNOSIS — R931 Abnormal findings on diagnostic imaging of heart and coronary circulation: Secondary | ICD-10-CM | POA: Diagnosis not present

## 2022-07-25 DIAGNOSIS — I10 Essential (primary) hypertension: Secondary | ICD-10-CM | POA: Diagnosis not present

## 2022-07-25 MED ORDER — LISINOPRIL 5 MG PO TABS: 10.0000 mg | ORAL_TABLET | Freq: Every day | ORAL | 3 refills | Status: DC

## 2022-07-25 MED ORDER — ATORVASTATIN CALCIUM 40 MG PO TABS: ORAL_TABLET | ORAL | 3 refills | Status: DC

## 2022-07-25 MED ORDER — METOPROLOL SUCCINATE ER 50 MG PO TB24: ORAL_TABLET | ORAL | 3 refills | Status: DC

## 2022-07-25 NOTE — Telephone Encounter (Signed)
Pt ready for scheduling on or after 03/13/21     Out-of-pocket cost due at time of visit: $0.00     Primary: [employer] Medicare  Prolia co-insurance: 0%  Admin fee co-insurance: 0%     Secondary: n/a  Prolia co-insurance:  Admin fee co-insurance:     Deductible: does not apply     Prior Auth: APPROVED  PA# [id number]  Valid 08/08/20-12/30/21     PA# [id number]  Valid: 12/31/21-12/30/22        ** This summary of benefits is an estimation of the patient's out-of-pocket cost. Exact cost may very based on individual plan coverage.

## 2022-07-25 NOTE — Patient Instructions (Signed)
Medication Instructions:  RESUME medications - prescriptions for lisinopril, metoprolol, atorvastatin have been sent to pharmacy. You can get aspirin '81mg'$  over the counter  *If you need a refill on your cardiac medications before your next appointment, please call your pharmacy*   Lab Work: FASTING lab work in 3-4 months  If you have labs (blood work) drawn today and your tests are completely normal, you will receive your results only by: [employer] (if you have MyChart) OR A paper copy in the mail If you have any lab test that is abnormal or we need to change your treatment, we will call you to review the results.   Testing/Procedures: NONE   Follow-Up: At [facility], you and your health needs are our priority.  As part of our continuing mission to provide you with exceptional heart care, we have created designated Provider Care Teams.  These Care Teams include your primary Cardiologist (physician) and Advanced Practice Providers (APPs -  Physician Assistants and Nurse Practitioners) who all work together to provide you with the care you need, when you need it.  We recommend signing up for the patient portal called "MyChart".  Sign up information is provided on this After Visit Summary.  MyChart is used to connect with patients for Virtual Visits (Telemedicine).  Patients are able to view lab/test results, encounter notes, upcoming appointments, etc.  Non-urgent messages can be sent to your provider as well.   To learn more about what you can do with MyChart, go to [email or web address].    Your next appointment:   3-4 month(s)  The format for your next appointment:   In Person  Provider:   Lyman Bishop MD or Coletta Memos, FNP, Fabian Sharp, PA-C, Sande Rives, PA-C, Caron Presume, PA-C, Jory Sims, DNP, ANP, Almyra Deforest, PA-C, or Diona Browner, NP    {

## 2022-07-25 NOTE — Progress Notes (Signed)
OFFICE NOTE  Chief Complaint:  Follow-up  Primary Care Physician: Binnie Rail, MD  HPI:  Regina Ramsey is a 81 y.o. female with a past medial history significant for Regina Ramsey returns today for follow-up.  She was seen by video visit.  She has no new complaints.  In November we performed cardiac CT for symptoms concerning for angina.  She was noted to have a high coronary artery calcium score of 500 with multivessel calcification.  The study was then sent for CT FFR to rule out high-grade proximal disease particularly in the LAD.  The FFR was not significant other than the distal LAD however a definitive left heart catheterization was performed which actually corroborated those findings.  There was no significant obstructive disease, small intramyocardial segment of the LAD was noted, but otherwise mild nonobstructive coronary disease with calcification.  Aggressive medical therapy was recommended.  Her blood pressure had remained elevated but after working with the hypertension clinic now is better controlled.  She also had a lipid profile in January with total cholesterol 145, triglycerides 104, HDL 41 and LDL of 83.  At the time her a atorvastatin was then increased to 40 mg daily to target LDL less than 70.  She is also been working on weight loss and more regular exercise.   03/22/20   Regina Ramsey is without complaints today.  She denies any chest pain or worsening shortness of breath.  She had a fall recently and actually is on her way to [facility] today for an appointment.  She was not able to provide her blood pressure.  She says she has had some erratic blood pressure readings at home.  Her last recorded blood pressure was 143/92.  I advised her to bring her blood pressure cuff in if she has some concerns to validate it.  She has had both Covid vaccine shots.  Cholesterol has been well controlled with labs about 8 months ago showed an LDL of 59.  07/25/2022  Regina Ramsey  returns today for follow-up.  I last saw her via video visit in 2021.  She says she has been dealing with her husband's health issues and is neglected follow-up.  She was not able to get an appointment and ran out of her medications.  Recently her PCP had given her a short course of medications but no refills.  She is now off of all of her medications again.  She understands the importance of compliance with this.  She has not had any lab work particular repeat lipid profile since 2020.  PMHx:  Past Medical History:  Diagnosis Date   ALLERGIC RHINITIS    ANEMIA-NOS    Anxiety    Asthma    Triggered by allergies   COPD    normal PFTs 06/22/11   Diverticulosis of colon    Dyspareunia    Endometriosis    GERD    HEARING LOSS 07/13/2010   HYPERLIPIDEMIA    HYPERTENSION    Irritable bowel syndrome    MIGRAINE HEADACHE    OSTEOPOROSIS    on Prolia q 64mo hx vertebral fx   PALPITATIONS, HX OF    Vaginal atrophy     Past Surgical History:  Procedure Laterality Date   ABDOMINAL HYSTERECTOMY  12/31/1972   APPENDECTOMY  12/31/1956   BREAST SURGERY  01/01/1984   breast biopsy   CATARACT EXTRACTION, BILATERAL     LEFT HEART CATH AND CORONARY ANGIOGRAPHY N/A 12/03/2018   Procedure: LEFT  HEART CATH AND CORONARY ANGIOGRAPHY;  Surgeon: Troy Sine, MD;  Location: [facility];  Service: Cardiovascular;  Laterality: N/A;    FAMHx:  Family History  Adopted: Yes  Problem Relation Age of Onset   Arthritis Mother    Breast cancer Mother    Cancer Mother        started on her spine met cancer   Bone cancer Mother    Arthritis Father    Osteoarthritis Sister    Cancer - Prostate Brother    Cancer Brother    Hyperlipidemia Maternal Grandmother    Other Son        tachycardia   Breast cancer Maternal Aunt    Diabetes Other        parent & grandparent   Colon cancer Neg Hx    Colon polyps Neg Hx     SOCHx:   reports that she quit smoking about 58 years ago. Her smoking use  included cigarettes. She has never used smokeless tobacco. She reports current alcohol use of about 2.0 standard drinks of alcohol per week. She reports that she does not use drugs.  ALLERGIES:  Allergies  Allergen Reactions   Short Ragweed Pollen Ext     ROS: Pertinent items noted in HPI and remainder of comprehensive ROS otherwise negative.  HOME MEDS: Current Outpatient Medications on File Prior to Visit  Medication Sig Dispense Refill   aspirin EC 81 MG tablet Take 81 mg by mouth daily. (Patient not taking: Reported on 07/25/2022)     atorvastatin (LIPITOR) 40 MG tablet TAKE 1 TABLET(40 MG) BY MOUTH DAILY AT 6 PM (Patient not taking: Reported on 03/27/2022) 90 tablet 3   calcium carbonate (TUMS - DOSED IN MG ELEMENTAL CALCIUM) 500 MG chewable tablet Chew 1 tablet by mouth daily as needed. (Patient not taking: Reported on 07/25/2022)     lisinopril (ZESTRIL) 5 MG tablet Take 2 tablets (10 mg total) by mouth daily. TAKE 1 TABLET(5 MG) BY MOUTH DAILY (Patient not taking: Reported on 07/25/2022) 180 tablet 0   Loperamide HCl (IMODIUM PO) Take by mouth. Patient takes every 3-5 days as needed (Patient not taking: Reported on 07/25/2022)     metoprolol succinate (TOPROL-XL) 50 MG 24 hr tablet TAKE 1 TABLET(50 MG) BY MOUTH DAILY (Patient not taking: Reported on 07/25/2022) 90 tablet 0   pantoprazole (PROTONIX) 40 MG tablet Take 1 tablet (40 mg total) by mouth daily. Take 30 minutes to a meal (Patient not taking: Reported on 07/25/2022) 30 tablet 3   No current facility-administered medications on file prior to visit.    LABS/IMAGING: No results found for this or any previous visit (from the past 48 hour(s)). No results found.  LIPID PANEL:    Component Value Date/Time   CHOL 119 07/17/2019 0907   CHOL 145 01/08/2019 1133   TRIG 97.0 07/17/2019 0907   TRIG 117 08/03/2009 0000   HDL 40.50 07/17/2019 0907   HDL 41 01/08/2019 1133   CHOLHDL 3 07/17/2019 0907   VLDL 19.4 07/17/2019 0907    LDLCALC 59 07/17/2019 0907   LDLCALC 83 01/08/2019 1133   LDLDIRECT 193.0 07/13/2010 0000     WEIGHTS: Wt Readings from Last 3 Encounters:  07/25/22 133 lb 9.6 oz (60.6 kg)  04/23/22 129 lb (58.5 kg)  03/28/22 131 lb 9.6 oz (59.7 kg)    VITALS: BP (!) 146/84   Pulse 81   Ht 4' 10"  (1.473 m)   Wt 133 lb 9.6 oz (  60.6 kg)   SpO2 96%   BMI 27.92 kg/m   EXAM: General appearance: alert and no distress Neck: no carotid bruit, no JVD, and thyroid not enlarged, symmetric, no tenderness/mass/nodules Lungs: clear to auscultation bilaterally Heart: regular rate and rhythm, S1, S2 normal, no murmur, click, rub or gallop Abdomen: soft, non-tender; bowel sounds normal; no masses,  no organomegaly Extremities: extremities normal, atraumatic, no cyanosis or edema Pulses: 2+ and symmetric Skin: Skin color, texture, turgor normal. No rashes or lesions Neurologic: Grossly normal Psych: Pleasant  EKG: Normal sinus rhythm at 81, LVH by voltage, IVCD- personally reviewed  ASSESSMENT: Multivessel coronary artery disease with heavy calcification and CAC 500, nonobstructive coronary disease by cath (11/2018) with 20% mid LAD stenosis and 15% proximal to mid LAD stenosis and intramyocardial bridge of the mid LAD Essential hypertension Mixed dyslipidemia LBBB Palpitations  PLAN: 1.   Regina Ramsey returns today for follow-up.  She has been noncompliant with the medications and that she had not return for follow-up.  Based on this she has been off of all of her meds.  We will need to restart those today including her blood pressure and cholesterol meds.  She has not had a lipid test since 2020.  We will order a repeat along with a comprehensive metabolic profile in about 3 months.  Plan follow-up with [place] at that time to reassess her blood pressure as well and go over her lab work.  Pixie Casino, MD, [facility], [place] Director of the Advanced Lipid Disorders &   Cardiovascular Risk Reduction Clinic Diplomate of the American Board of Clinical Lipidology Attending Cardiologist  Direct Dial: [phone]  Fax: [phone]  Website:  [email or web address] 07/25/2022, 4:27 PM

## 2022-07-25 NOTE — Telephone Encounter (Signed)
I was able to speak with the pt and have already sched the pt for her injections.  1st injection: 08/13/2022 2nd Injection: 02/14/2023  Pt is approved until 12/31/2023. I also informed the pt that when she comes in for her February Prolia  injection she is to sched for 2nd injection in 2024 for around 08/16/2023.

## 2022-08-01 DIAGNOSIS — E782 Mixed hyperlipidemia: Secondary | ICD-10-CM | POA: Diagnosis not present

## 2022-08-01 LAB — COMPREHENSIVE METABOLIC PANEL
ALT: 12 IU/L (ref 0–32)
AST: 17 IU/L (ref 0–40)
Albumin/Globulin Ratio: 1.9 (ref 1.2–2.2)
Albumin: 4.3 g/dL (ref 3.7–4.7)
Alkaline Phosphatase: 65 IU/L (ref 44–121)
BUN/Creatinine Ratio: 16 (ref 12–28)
BUN: 15 mg/dL (ref 8–27)
Bilirubin Total: 0.7 mg/dL (ref 0.0–1.2)
CO2: 25 mmol/L (ref 20–29)
Calcium: 9.5 mg/dL (ref 8.7–10.3)
Chloride: 104 mmol/L (ref 96–106)
Creatinine, Ser: 0.92 mg/dL (ref 0.57–1.00)
Globulin, Total: 2.3 g/dL (ref 1.5–4.5)
Glucose: 104 mg/dL — ABNORMAL HIGH (ref 70–99)
Potassium: 4.5 mmol/L (ref 3.5–5.2)
Sodium: 143 mmol/L (ref 134–144)
Total Protein: 6.6 g/dL (ref 6.0–8.5)
eGFR: 63 mL/min/{1.73_m2} (ref >59)

## 2022-08-01 LAB — LIPID PANEL
Chol/HDL Ratio: 3.2 ratio (ref 0.0–4.4)
Cholesterol, Total: 157 mg/dL (ref 100–199)
HDL: 49 mg/dL (ref >39)
LDL Chol Calc (NIH): 88 mg/dL (ref 0–99)
Triglycerides: 110 mg/dL (ref 0–149)
VLDL Cholesterol Cal: 20 mg/dL (ref 5–40)

## 2022-08-02 DIAGNOSIS — E782 Mixed hyperlipidemia: Secondary | ICD-10-CM

## 2022-08-02 MED ORDER — ATORVASTATIN CALCIUM 40 MG PO TABS: 80.0000 mg | ORAL_TABLET | Freq: Every day | ORAL | 3 refills | Status: DC

## 2022-08-13 ENCOUNTER — Ambulatory Visit (INDEPENDENT_AMBULATORY_CARE_PROVIDER_SITE_OTHER): Payer: Medicare PPO

## 2022-08-13 DIAGNOSIS — M810 Age-related osteoporosis without current pathological fracture: Secondary | ICD-10-CM | POA: Diagnosis not present

## 2022-08-13 MED ORDER — DENOSUMAB 60 MG/ML ~~LOC~~ SOSY
60.0000 mg | PREFILLED_SYRINGE | Freq: Once | SUBCUTANEOUS | Status: AC
  Administered 2022-08-13: 60 mg via SUBCUTANEOUS

## 2022-08-13 NOTE — Progress Notes (Addendum)
Patient seen in office today and Prolia administered in left arm. Patient tolerated injection fine.

## 2022-08-21 ENCOUNTER — Other Ambulatory Visit: Payer: Medicare PPO

## 2022-08-21 ENCOUNTER — Ambulatory Visit: Payer: Medicare PPO | Admitting: Gastroenterology

## 2022-08-21 ENCOUNTER — Encounter: Payer: Self-pay | Admitting: Gastroenterology

## 2022-08-21 VITALS — BP 138/80 | HR 74 | Ht <= 58 in | Wt 131.4 lb

## 2022-08-21 DIAGNOSIS — R12 Heartburn: Secondary | ICD-10-CM

## 2022-08-21 DIAGNOSIS — R1319 Other dysphagia: Secondary | ICD-10-CM

## 2022-08-21 DIAGNOSIS — K529 Noninfective gastroenteritis and colitis, unspecified: Secondary | ICD-10-CM

## 2022-08-21 MED ORDER — DICYCLOMINE HCL 10 MG PO CAPS: 10.0000 mg | ORAL_CAPSULE | Freq: Two times a day (BID) | ORAL | 1 refills | Status: DC | PRN

## 2022-08-21 NOTE — Progress Notes (Signed)
[facility] Consult Note:  History: Regina Ramsey 08/21/2022  Referring provider: Binnie Rail, MD  Reason for consult/chief complaint: Diarrhea (Uncontrollable getting worse ) and Gastroesophageal Reflux (Almost under control. Does protonix daily)   Subjective  HPI:  Regina Ramsey was here for reevaluation of her digestive issues.  Excerpt from Dr. Kelby Ramsey January and March 2016 office notes as follows: "History of Present Illness:  Ms. Regina Ramsey is a pleasant 81 year old white female referred for evaluation of diarrhea and incontinence.  For years she's had problems with intermittent diarrhea with severe urgency and incontinence.  This problem is becoming more frequent.  Approximate twice a week develops diarrhea with severe urgency and frequent incontinence.  She may have several loose stools and the sensation of tenesmus.  In between these episodes she has a normal solid stool.  There is no history of rectal bleeding.  She is unaware of any particular foods that cause this problem.  She tends to avoid dairy products.  Last colonoscopy was over 10 years ago.   Patient has a history of GERD.  If she overeats she'll have frequent regurgitation of gastric contents for hours and will take Protonix for a couple of weeks. _____________________________  History of Present Illness:  Ms. Regina Ramsey has returned following colonoscopy.  Adenomatous polyps were removed.  Random biopsies were negative for microscopic colitis.  On a regimen of Benefiber and, more recently, xifaxan, diarrhea has subsided.  She attributes the improvement to Benefiber.  She also has identified several food triggers. " _____________________________ From my 06/2018 office note: This is a very pleasant 81 year old woman referred by primary care.  She was last seen by Dr. Deatra Ramsey in March 2016 after a colonoscopy that was done for intermittent diarrhea with fecal incontinence.  Biopsies negative for microscopic  colitis.  Her symptoms were reportedly improved on a regimen of fiber and rifaximin.  She has not been seen in the clinic since then. Regina Ramsey was sent by Dr. Quay Ramsey for reevaluation of this problem.  She describes it being an issue her entire life, but worse in the last several years.  She does not have chronic abdominal pain, and some days has regular bowel movements.  However, more days than not, she will have loose stool with urgency and occasional accidents if she cannot get to the bathroom on time.  She denies nocturnal diarrhea, and has had no episodes of volume depletion.  This is more of a problem if she is out of the house for the day or travel, as she has come to realize that even anticipating the need for a bathroom might make the symptoms occur.  Her appetite is generally good, and she has gained few pounds in the last year.  She denies dysphagia, odynophagia, nausea, vomiting or early satiety. ______________  She has classic IBS symptoms occurring her entire life, worse in the last several years.  She has anticipatory symptoms when out of the house.  We discussed the benign nature of this condition and the available therapies.  I think low-dose dicyclomine or hyoscyamine would work well for her.  If that works well, she can also take an evening dose if needed.  She is cautioned against daily dosing of Imodium because it might precipitate constipation.  For some time now, she has been using a dose of that about twice a week which will control the symptoms until they recur again a few days later. I do not think she has a small bowel malabsorptive condition,  and she reports no improvement on previous trial of rifaximin, either with Dr. Deatra Ramsey gave her to her nor another trial by primary care last year."   - Dicyclomine was prescribed _____________________________  Regina Ramsey tells me this lifelong problem has continued with urgency and intermittent loose stool.  She was taking Imodium every other day  but developed severe constipation months ago such that she thought she might need visit the emergency department.  She then had relief of that after the ambulance arrived and she has stopped taking Imodium.  She also stopped taking Benefiber at some point in the past but cannot recall why.  She also does not recall whether dicyclomine was helpful after her last visit with me.  She will often have stools that are small or thin but regular, and then have a day of 3-4 loose BMs with urgency, and a recent episode occurred when she knew she had to be at a funeral the following day. She often thinks there may be food triggers but has difficulty identifying them.  No rectal bleeding, weight has been stable.  She also reports having had frequent heartburn until it got under much better control and Dr. Quay Ramsey prescribed pantoprazole.  She still has intermittent pill dysphagia.  ROS:  Review of Systems  Constitutional:  Negative for appetite change and unexpected weight change.  HENT:  Negative for mouth sores and voice change.   Eyes:  Negative for pain and redness.  Respiratory:  Negative for cough and shortness of breath.   Cardiovascular:  Negative for chest pain and palpitations.  Gastrointestinal:  Negative for abdominal pain.  Genitourinary:  Negative for dysuria and hematuria.  Musculoskeletal:  Positive for arthralgias. Negative for myalgias.  Skin:  Negative for pallor and rash.  Neurological:  Negative for weakness and headaches.  Hematological:  Negative for adenopathy.     Past Medical History: Past Medical History:  Diagnosis Date   ALLERGIC RHINITIS    ANEMIA-NOS    Anxiety    Asthma    Triggered by allergies   COPD    normal PFTs 06/22/11   Diverticulosis of colon    Dyspareunia    Endometriosis    GERD    HEARING LOSS 07/13/2010   HYPERLIPIDEMIA    HYPERTENSION    Irritable bowel syndrome    MIGRAINE HEADACHE    OSTEOPOROSIS    on Prolia q 74mo hx vertebral fx    PALPITATIONS, HX OF    Vaginal atrophy      Past Surgical History: Past Surgical History:  Procedure Laterality Date   ABDOMINAL HYSTERECTOMY  12/31/1972   APPENDECTOMY  12/31/1956   BREAST SURGERY  01/01/1984   breast biopsy   CATARACT EXTRACTION, BILATERAL     LEFT HEART CATH AND CORONARY ANGIOGRAPHY N/A 12/03/2018   Procedure: LEFT HEART CATH AND CORONARY ANGIOGRAPHY;  Surgeon: KTroy Sine MD;  Location: [facility];  Service: Cardiovascular;  Laterality: N/A;     Family History: Family History  Adopted: Yes  Problem Relation Age of Onset   Arthritis Mother    Breast cancer Mother    Cancer Mother        started on her spine met cancer   Bone cancer Mother    Arthritis Father    Osteoarthritis Sister    Cancer - Prostate Brother    Cancer Brother    Hyperlipidemia Maternal Grandmother    Other Son        tachycardia   Breast cancer  Maternal Aunt    Diabetes Other        parent & grandparent   Colon cancer Neg Hx    Colon polyps Neg Hx     Social History: Social History   Socioeconomic History   Marital status: Married    Spouse name: Not on file   Number of children: 2   Years of education: Not on file   Highest education level: Not on file  Occupational History   Occupation: Retired   Occupation: retired    Comment: Pharmacist, hospital  Tobacco Use   Smoking status: Former    Types: Cigarettes    Quit date: 01/01/1964    Years since quitting: 58.6   Smokeless tobacco: Never   Tobacco comments:    Married, lives with souse. Master of education-specialist needs for grade school  Vaping Use   Vaping Use: Never used  Substance and Sexual Activity   Alcohol use: Yes    Alcohol/week: 2.0 standard drinks of alcohol    Types: 2 Standard drinks or equivalent per week    Comment: Social   Drug use: No   Sexual activity: Not on file  Other Topics Concern   Not on file  Social History Narrative   Retired Pharmacist, hospital      No regular exercise, active       epworth sleepiness scale = 10 (05/30/16)   Social Determinants of Health   Financial Resource Strain: Low Risk  (07/19/2022)   Overall Financial Resource Strain (CARDIA)    Difficulty of Paying Living Expenses: Not hard at all  Food Insecurity: No Food Insecurity (07/19/2022)   Hunger Vital Sign    Worried About Running Out of Food in the Last Year: Never true    Ran Out of Food in the Last Year: Never true  Transportation Needs: No Transportation Needs (07/19/2022)   PRAPARE - Hydrologist (Medical): No    Lack of Transportation (Non-Medical): No  Physical Activity: Sufficiently Active (07/19/2022)   Exercise Vital Sign    Days of Exercise per Week: 4 days    Minutes of Exercise per Session: 60 min  Stress: No Stress Concern Present (07/19/2022)   [place]    Feeling of Stress : Not at all  Social Connections: [place] (07/19/2022)   Social Connection and Isolation Panel [NHANES]    Frequency of Communication with Friends and Family: More than three times a week    Frequency of Social Gatherings with Friends and Family: More than three times a week    Attends Religious Services: More than 4 times per year    Active Member of [employer] or Organizations: Yes    Attends Music therapist: More than 4 times per year    Marital Status: Married    Allergies: Allergies  Allergen Reactions   Short Ragweed Pollen Ext     Outpatient Meds: Current Outpatient Medications  Medication Sig Dispense Refill   aspirin EC 81 MG tablet Take 81 mg by mouth daily.     atorvastatin (LIPITOR) 40 MG tablet Take 2 tablets (80 mg total) by mouth daily. TAKE 1 TABLET(40 MG) BY MOUTH DAILY AT 6 PM 180 tablet 3   lisinopril (ZESTRIL) 5 MG tablet Take 2 tablets (10 mg total) by mouth daily. 180 tablet 3   metoprolol succinate (TOPROL-XL) 50 MG 24 hr tablet TAKE 1 TABLET(50 MG) BY MOUTH DAILY 90  tablet 3  pantoprazole (PROTONIX) 40 MG tablet Take 1 tablet (40 mg total) by mouth daily. Take 30 minutes to a meal 30 tablet 3   No current facility-administered medications for this visit.      ___________________________________________________________________ Objective   Exam:  BP 138/80   Pulse 74   Ht 4' 10"  (1.473 m)   Wt 131 lb 6 oz (59.6 kg)   BMI 27.46 kg/m  Wt Readings from Last 3 Encounters:  08/21/22 131 lb 6 oz (59.6 kg)  07/25/22 133 lb 9.6 oz (60.6 kg)  04/23/22 129 lb (58.5 kg)   (Note - weight stable last 6 months)  General: Well-appearing, no muscle wasting Eyes: sclera anicteric, no redness ENT: oral mucosa moist without lesions, no cervical or supraclavicular lymphadenopathy CV: Regular without murmur, no JVD, no peripheral edema Resp: clear to auscultation bilaterally, normal RR and effort noted GI: soft, no tenderness, with active bowel sounds. No guarding or palpable organomegaly noted. Skin; warm and dry, no rash or jaundice noted Neuro: awake, alert and oriented x 3. Normal gross motor function and fluent speech  Labs:  No prior celiac testing      Latest Ref Rng & Units 09/18/2021    4:32 PM 11/16/2019    1:41 PM 11/21/2018    3:41 PM  CBC  WBC 4.0 - 10.5 K/uL 6.6  7.9  6.0   Hemoglobin 12.0 - 15.0 g/dL 13.2  13.4  13.3   Hematocrit 36.0 - 46.0 % 40.5  41.6  40.5   Platelets 150.0 - 400.0 K/uL 239.0  273  263       Latest Ref Rng & Units 08/01/2022   11:42 AM 09/18/2021    4:32 PM 11/16/2019    1:41 PM  CMP  Glucose 70 - 99 mg/dL 104  130  101   BUN 8 - 27 mg/dL 15  22  20    Creatinine 0.57 - 1.00 mg/dL 0.92  0.96  0.94   Sodium 134 - 144 mmol/L 143  144  140   Potassium 3.5 - 5.2 mmol/L 4.5  4.0  4.0   Chloride 96 - 106 mmol/L 104  107  104   CO2 20 - 29 mmol/L 25  27  25    Calcium 8.7 - 10.3 mg/dL 9.5  9.4  9.2   Total Protein 6.0 - 8.5 g/dL 6.6  7.0    Total Bilirubin 0.0 - 1.2 mg/dL 0.7  0.6    Alkaline Phos 44 - 121 IU/L  65  64    AST 0 - 40 IU/L 17  20    ALT 0 - 32 IU/L 12  17       Assessment: Encounter Diagnoses  Name Primary?   Chronic diarrhea Yes   Heartburn    Esophageal dysphagia     Lifelong IBS-like symptoms with intermittent diarrhea and urgency.  Previous negative biopsies for microscopic colitis.  Perhaps that condition has developed in the interim, but overall it sounds that her symptoms have been similar to previous evaluations.  I think it has worsened recently because she has not been on any particular treatments.  It sounds like she feels well for a while and then stops taking some of her treatments.  Celiac sprue does not seem to have been previously tested for, though would be unlikely to colonic this age.  No clear risk factors for EPI  No clear risk factors for SIBO, no weight loss to suggest malabsorption.  Recurrence of reflux symptoms now under  better control on PPI.  Plan:  Trial of low-dose dicyclomine, only up to 2 tablets a day as needed.  She can take 1 in the morning if she has a loose stool, but certainly not days without diarrhea.  Resume a tablespoon of Benefiber daily.  Written dietary evaluation regarding diarrhea, bloating and gas were given  Stool for C. difficile PCR, calprotectin and elastase  Celiac antibody  Barium esophagram  Return to office about 6 weeks  Thank you for the courtesy of this consult.  Please call me with any questions or concerns.  Nelida Meuse III  CC: Referring provider noted above

## 2022-08-21 NOTE — Patient Instructions (Addendum)
_______________________________________________________  If you are age 81 or older, your body mass index should be between 23-30. Your Body mass index is 27.46 kg/m. If this is out of the aforementioned range listed, please consider follow up with your Primary Care Provider.  If you are age 20 or younger, your body mass index should be between 19-25. Your Body mass index is 27.46 kg/m. If this is out of the aformentioned range listed, please consider follow up with your Primary Care Provider.   ________________________________________________________  The [facility] providers would like to encourage you to use [facility] to communicate with providers for non-urgent requests or questions.  Due to long hold times on the telephone, sending your provider a message by [facility] may be a faster and more efficient way to get a response.  Please allow 48 business hours for a response.  Please remember that this is for non-urgent requests.  _______________________________________________________  Your provider has requested that you go to the basement level for lab work before leaving today. Press "B" on the elevator. The lab is located at the first door on the left as you exit the elevator.  We have sent the following medications to your pharmacy for you to pick up at your convenience: Bentyl take as needed. Can take up to 2 a day  You have been scheduled for an appointment with Dr. Loletha Carrow on 10-25-2022 at 10:40am . Please arrive 10 minutes early for your appointment.   You have been scheduled for a Barium Esophogram at [facility] (1st floor of the hospital) on 08-29-2022 at 1030am. Please arrive 15 minutes prior to your appointment for registration. Make certain not to have anything to eat or drink 3 hours prior to your test. If you need to reschedule for any reason, please contact radiology at [phone] to do so. __________________________________________________________________ A barium  swallow is an examination that concentrates on views of the esophagus. This tends to be a double contrast exam (barium and two liquids which, when combined, create a gas to distend the wall of the oesophagus) or single contrast (non-ionic iodine based). The study is usually tailored to your symptoms so a good history is essential. Attention is paid during the study to the form, structure and configuration of the esophagus, looking for functional disorders (such as aspiration, dysphagia, achalasia, motility and reflux) EXAMINATION You may be asked to change into a gown, depending on the type of swallow being performed. A radiologist and radiographer will perform the procedure. The radiologist will advise you of the type of contrast selected for your procedure and direct you during the exam. You will be asked to stand, sit or lie in several different positions and to hold a small amount of fluid in your mouth before being asked to swallow while the imaging is performed .In some instances you may be asked to swallow barium coated marshmallows to assess the motility of a solid food bolus. The exam can be recorded as a digital or video fluoroscopy procedure. POST PROCEDURE It will take 1-2 days for the barium to pass through your system. To facilitate this, it is important, unless otherwise directed, to increase your fluids for the next 24-48hrs and to resume your normal diet.  This test typically takes about 30 minutes to perform. __________________________________________________________________________________  ____________________________________   _______________________________________________________  Food Guidelines for those with chronic digestive trouble:  Many people have difficulty digesting certain foods, causing a variety of distressing and embarrassing symptoms such as abdominal pain, bloating and gas.  These foods may need to be avoided or consumed in small amounts.  Here are some tips  that might be helpful for you.  1.   Lactose intolerance is the difficulty or complete inability to digest lactose, the natural sugar in milk and anything made from milk.  This condition is harmless, common, and can begin any time during life.  Some people can digest a modest amount of lactose while others cannot tolerate any.  Also, not all dairy products contain equal amounts of lactose.  For example, hard cheeses such as parmesan have less lactose than soft cheeses such as cheddar.  Yogurt has less lactose than milk or cheese.  Many packaged foods (even many brands of bread) have milk, so read ingredient lists carefully.  It is difficult to test for lactose intolerance, so just try avoiding lactose as much as possible for a week and see what happens with your symptoms.  If you seem to be lactose intolerant, the best plan is to avoid it (but make sure you get calcium from another source).  The next best thing is to use lactase enzyme supplements, available over the counter everywhere.  Just know that many lactose intolerant people need to take several tablets with each serving of dairy to avoid symptoms.  Lastly, a lot of restaurant food is made with milk or butter.  Many are things you might not suspect, such as mashed potatoes, rice and pasta (cooked with butter) and "grilled" items.  If you are lactose intolerant, it never hurts to ask your server what has milk or butter.  2.   Fiber is an important part of your diet, but not all fiber is well-tolerated.  Insoluble fiber such as bran is often consumed by normal gut bacteria and converted into gas.  Soluble fiber such as oats, squash, carrots and green beans are typically tolerated better.  3.   Some types of carbohydrates can be poorly digested.  Examples include: fructose (apples, cherries, pears, raisins and other dried fruits), fructans (onions, zucchini, large amounts of wheat), sorbitol/mannitol/xylitol and sucralose/Splenda (common artificial  sweeteners), and raffinose (lentils, broccoli, cabbage, asparagus, brussel sprouts, many types of beans).  Do a Development worker, community for [employer] and you will find helpful information. Beano, a dietary supplement, will often help with raffinose-containing foods.  As with lactase tablets, you may need several per serving.  4.   Whenever possible, avoid processed food&meats and chemical additives.  High fructose corn syrup, a common sweetener, may be difficult to digest.  Eggs and soy (comes from the soybean, and added to many foods now) are other common bloating/gassy foods.  5.  Regarding gluten:  gluten is a protein mainly found in wheat, but also rye and barley.  There is a condition called celiac sprue, which is an inflammatory reaction in the small intestine causing a variety of digestive symptoms.  Blood testing is highly reliable to look for this condition, and sometimes upper endoscopy with small bowel biopsies may be necessary to make the diagnosis.  Many patients who test negative for celiac sprue report improvement in their digestive symptoms when they switch to a gluten-free diet.  However, in these "non-celiac gluten sensitive" patients, the true role of gluten in their symptoms is unclear.  Reducing carbohydrates in general may decrease the gas and bloating caused when gut bacteria consume carbs. Also, some of these patients may actually be intolerant of the baker's yeast in bread products rather than the gluten.  Flatbread and other reduced  yeast breads might therefore be tolerated.  There is no specific testing available for most food intolerances, which are discovered mainly by dietary elimination.  Please do not embark on a gluten free diet unless directed by your doctor, as it is highly restrictive, and may lead to nutritional deficiencies if not carefully monitored.  Lastly, beware of internet claims offering "personalized" tests for food intolerances.  Such testing has no reliable scientific  evidence to support its reliability and correlation to symptoms.    6.  The best advice is old advice, especially for those with chronic digestive trouble - try to eat "clean".  Balanced diet, avoid processed food, plenty of fruits and vegetables, cut down the sugar, minimal alcohol, avoid tobacco. Make time to care for yourself, get enough sleep, exercise when you can, reduce stress.  Your guts will thank you for it.   - Dr. [facility]  ____________________________________________________________

## 2022-08-22 LAB — IGA: Immunoglobulin A: 189 mg/dL (ref 70–320)

## 2022-08-22 LAB — TISSUE TRANSGLUTAMINASE, IGA: (tTG) Ab, IgA: <1 U/mL

## 2022-08-23 NOTE — Telephone Encounter (Signed)
Last Prolia inj 08/13/22 Next Prolia inj due 02/14/23

## 2022-08-29 ENCOUNTER — Ambulatory Visit (HOSPITAL_COMMUNITY)
Admission: RE | Admit: 2022-08-29 | Discharge: 2022-08-29 | Disposition: A | Discharge status: Home / Self Care | Payer: Medicare PPO | Source: Ambulatory Visit | Attending: Gastroenterology | Admitting: Gastroenterology

## 2022-08-29 DIAGNOSIS — K449 Diaphragmatic hernia without obstruction or gangrene: Secondary | ICD-10-CM | POA: Diagnosis not present

## 2022-08-29 DIAGNOSIS — K219 Gastro-esophageal reflux disease without esophagitis: Secondary | ICD-10-CM | POA: Diagnosis not present

## 2022-08-29 DIAGNOSIS — R1319 Other dysphagia: Secondary | ICD-10-CM | POA: Insufficient documentation

## 2022-08-29 DIAGNOSIS — R1310 Dysphagia, unspecified: Secondary | ICD-10-CM | POA: Diagnosis not present

## 2022-08-29 DIAGNOSIS — R12 Heartburn: Secondary | ICD-10-CM | POA: Diagnosis not present

## 2022-09-21 ENCOUNTER — Telehealth: Payer: Self-pay | Admitting: *Deleted

## 2022-09-21 NOTE — Telephone Encounter (Signed)
Patient called the refill pool requesting Atorvastatin. She stated that the "Pharmacy told her that she didn't have refills for the medication. In epic on medication list 3 refills were given. Patient was advised to contact her pharmacy to request a refill, and also advised to contact Dr. Lysbeth Penner office if pharmacy denies refill. Thank you

## 2022-09-24 ENCOUNTER — Encounter (HOSPITAL_BASED_OUTPATIENT_CLINIC_OR_DEPARTMENT_OTHER): Payer: Self-pay | Admitting: Internal Medicine

## 2022-09-24 MED ORDER — ATORVASTATIN CALCIUM 40 MG PO TABS: 80.0000 mg | ORAL_TABLET | Freq: Every day | ORAL | 3 refills | Status: DC

## 2022-09-28 ENCOUNTER — Encounter: Payer: Self-pay | Admitting: Internal Medicine

## 2022-10-03 MED ORDER — PANTOPRAZOLE SODIUM 40 MG PO TBEC: 40.0000 mg | DELAYED_RELEASE_TABLET | Freq: Two times a day (BID) | ORAL | 3 refills | Status: DC

## 2022-10-25 ENCOUNTER — Ambulatory Visit: Payer: Medicare PPO | Admitting: General Practice

## 2022-10-25 ENCOUNTER — Ambulatory Visit: Payer: Medicare PPO | Admitting: Gastroenterology

## 2022-11-06 NOTE — Progress Notes (Unsigned)
    Subjective:    Patient ID: Regina Ramsey, female    DOB: May 03, 1941, 81 y.o.   MRN: [id number]      HPI Hatsuko is here for No chief complaint on file.   Fell two weeks ago - twisted ankle, hit hip, shoulder and head     Medications and allergies reviewed with patient and updated if appropriate.  Current Outpatient Medications on File Prior to Visit  Medication Sig Dispense Refill   aspirin EC 81 MG tablet Take 81 mg by mouth daily.     atorvastatin (LIPITOR) 40 MG tablet Take 2 tablets (80 mg total) by mouth daily. TAKE 1 TABLET(40 MG) BY MOUTH DAILY AT 6 PM 180 tablet 3   dicyclomine (BENTYL) 10 MG capsule Take 1 capsule (10 mg total) by mouth 2 (two) times daily as needed for spasms. 45 capsule 1   lisinopril (ZESTRIL) 5 MG tablet Take 2 tablets (10 mg total) by mouth daily. 180 tablet 3   metoprolol succinate (TOPROL-XL) 50 MG 24 hr tablet TAKE 1 TABLET(50 MG) BY MOUTH DAILY 90 tablet 3   pantoprazole (PROTONIX) 40 MG tablet Take 1 tablet (40 mg total) by mouth 2 (two) times daily before a meal. Take 30 minutes to a meal 60 tablet 3   No current facility-administered medications on file prior to visit.    Review of Systems     Objective:  There were no vitals filed for this visit. BP Readings from Last 3 Encounters:  08/21/22 138/80  07/25/22 (!) 146/84  04/23/22 (!) 146/88   Wt Readings from Last 3 Encounters:  08/21/22 131 lb 6 oz (59.6 kg)  07/25/22 133 lb 9.6 oz (60.6 kg)  04/23/22 129 lb (58.5 kg)   There is no height or weight on file to calculate BMI.    Physical Exam         Assessment & Plan:    See Problem List for Assessment and Plan of chronic medical problems.

## 2022-11-07 ENCOUNTER — Ambulatory Visit: Payer: Medicare PPO | Admitting: Internal Medicine

## 2022-11-07 ENCOUNTER — Encounter: Payer: Self-pay | Admitting: Internal Medicine

## 2022-11-07 VITALS — BP 118/70 | HR 86 | Temp 98.2°F | Ht <= 58 in | Wt 134.0 lb

## 2022-11-07 DIAGNOSIS — M25571 Pain in right ankle and joints of right foot: Secondary | ICD-10-CM | POA: Insufficient documentation

## 2022-11-07 DIAGNOSIS — M25551 Pain in right hip: Secondary | ICD-10-CM | POA: Diagnosis not present

## 2022-11-07 DIAGNOSIS — M25511 Pain in right shoulder: Secondary | ICD-10-CM

## 2022-11-07 DIAGNOSIS — K219 Gastro-esophageal reflux disease without esophagitis: Secondary | ICD-10-CM

## 2022-11-07 MED ORDER — PANTOPRAZOLE SODIUM 40 MG PO TBEC: 40.0000 mg | DELAYED_RELEASE_TABLET | Freq: Two times a day (BID) | ORAL | 3 refills | Status: DC | PRN

## 2022-11-07 NOTE — Assessment & Plan Note (Signed)
Related to inversion injury from fall 2 weeks ago No bruising, but still has some swelling and pain Discussed this will take a while to heal because she needs to be up and moving and is doing everything at home since her husband is recovering from surgery Doubt fracture Will get x-ray If no improvement will refer to sports medicine Continue Tylenol as needed, topical medication, ice, he

## 2022-11-07 NOTE — Assessment & Plan Note (Signed)
Acute on chronic Acute pain related to fall 2 weeks ago She did have some mild bruising on the hip that is improved Doubt fracture Likely muscle injury We will get x-ray Discussed symptomatic treatment If no improvement can have her see Ortho

## 2022-11-07 NOTE — Assessment & Plan Note (Signed)
Acute Related to fall 2 weeks ago Some decreased range of motion and pain into the upper arm ?  Rotator cuff injury, upper arm injury Doubt fracture Will get x-ray If no improvement will refer to sports medicine Continue Tylenol as needed, topical medication, ice, heat

## 2022-11-07 NOTE — Assessment & Plan Note (Signed)
Chronic GERD has been controlled Advised less of the pantoprazole is better-okay to take the pantoprazole once daily if needed, but ideally only take it as needed for heartburn.  If she gets a flare can take it for few days and try to get off of it

## 2022-11-07 NOTE — Patient Instructions (Addendum)
      Have xrays downstairs.    Medications changes include :   none     Return if symptoms worsen or fail to improve.  

## 2022-11-08 ENCOUNTER — Ambulatory Visit (INDEPENDENT_AMBULATORY_CARE_PROVIDER_SITE_OTHER): Payer: Medicare PPO

## 2022-11-08 DIAGNOSIS — M25511 Pain in right shoulder: Secondary | ICD-10-CM

## 2022-11-08 DIAGNOSIS — M25551 Pain in right hip: Secondary | ICD-10-CM

## 2022-11-08 DIAGNOSIS — M25571 Pain in right ankle and joints of right foot: Secondary | ICD-10-CM

## 2022-11-21 DIAGNOSIS — M542 Cervicalgia: Secondary | ICD-10-CM | POA: Diagnosis not present

## 2022-12-07 DIAGNOSIS — M542 Cervicalgia: Secondary | ICD-10-CM | POA: Diagnosis not present

## 2022-12-11 DIAGNOSIS — M542 Cervicalgia: Secondary | ICD-10-CM | POA: Diagnosis not present

## 2022-12-19 DIAGNOSIS — M542 Cervicalgia: Secondary | ICD-10-CM | POA: Diagnosis not present

## 2022-12-21 DIAGNOSIS — M542 Cervicalgia: Secondary | ICD-10-CM | POA: Diagnosis not present

## 2022-12-28 DIAGNOSIS — M542 Cervicalgia: Secondary | ICD-10-CM | POA: Diagnosis not present

## 2023-01-01 NOTE — Progress Notes (Unsigned)
Cardiology Office Note:    Date:  01/02/2023   ID:  Sherronda, Sweigert 1941-05-24, MRN [id number]  PCP:  Binnie Rail, MD Trainer Cardiologist: Pixie Casino, MD   Reason for visit: 3-4 month follow-up  History of Present Illness:    CALISSA SWENOR is a 82 y.o. female with a hx of CAD, hyperlipidemia, hypertension, LBBB palpitations.  Last seen by Dr. Debara Pickett in July 2023.  After dealing with her husband's health issues she ran out of her own medications and was restarted -atorvastatin, lisinopril, metoprolol.  Labwork ordered.  LDL was 88 with goal less than 70.  Lipitor increased from 40 to 80 mg daily.  She mentions she takes 40 mg in the morning and 40 mg later in the day as she likes to separate her medications  Today, she states she is doing well without chest pain, significant shortness of breath, palpitations or lightheadedness.  No issues with her medications.  She is able to complete her ADLs and helps care for her husband.  No bleeding issues with aspirin.  She has not been checking her blood pressure lately.  She wonders was elevated after going up the stairs.  Typically her blood pressure is better.    Past Medical History:  Diagnosis Date   ALLERGIC RHINITIS    ANEMIA-NOS    Anxiety    Asthma    Triggered by allergies   COPD    normal PFTs 06/22/11   Diverticulosis of colon    Dyspareunia    Endometriosis    GERD    HEARING LOSS 07/13/2010   HYPERLIPIDEMIA    HYPERTENSION    Irritable bowel syndrome    MIGRAINE HEADACHE    OSTEOPOROSIS    on Prolia q 66mo hx vertebral fx   PALPITATIONS, HX OF    Vaginal atrophy     Past Surgical History:  Procedure Laterality Date   ABDOMINAL HYSTERECTOMY  12/31/1972   APPENDECTOMY  12/31/1956   BREAST SURGERY  01/01/1984   breast biopsy   CATARACT EXTRACTION, BILATERAL     LEFT HEART CATH AND CORONARY ANGIOGRAPHY N/A 12/03/2018   Procedure: LEFT HEART CATH AND CORONARY ANGIOGRAPHY;  Surgeon: KTroy Sine MD;  [facility];  Service: Cardiovascular;  Laterality: N/A;    Current Medications: Current Meds  Medication Sig   aspirin EC 81 MG tablet Take 81 mg by mouth daily.   atorvastatin (LIPITOR) 40 MG tablet Take 2 tablets (80 mg total) by mouth daily. TAKE 1 TABLET(40 MG) BY MOUTH DAILY AT 6 PM   lisinopril (ZESTRIL) 5 MG tablet Take 2 tablets (10 mg total) by mouth daily.   metoprolol succinate (TOPROL-XL) 50 MG 24 hr tablet TAKE 1 TABLET(50 MG) BY MOUTH DAILY   pantoprazole (PROTONIX) 40 MG tablet Take 1 tablet (40 mg total) by mouth 2 (two) times daily as needed. Take 30 minutes to a meal     Allergies:   Short ragweed pollen ext   Social History   Socioeconomic History   Marital status: Married    Spouse name: Not on file   Number of children: 2   Years of education: Not on file   Highest education level: Not on file  Occupational History   Occupation: Retired   Occupation: retired    Comment: tPharmacist, hospital Tobacco Use   Smoking status: Former    Types: Cigarettes    Quit date: 01/01/1964    Years since quitting:  59.0   Smokeless tobacco: Never   Tobacco comments:    Married, lives with souse. Master of education-specialist needs for grade school  Vaping Use   Vaping Use: Never used  Substance and Sexual Activity   Alcohol use: Yes    Alcohol/week: 2.0 standard drinks of alcohol    Types: 2 Standard drinks or equivalent per week    Comment: Social   Drug use: No   Sexual activity: Not on file  Other Topics Concern   Not on file  Social History Narrative   Retired Pharmacist, hospital      No regular exercise, active      epworth sleepiness scale = 10 (05/30/16)   Social Determinants of Health   Financial Resource Strain: Low Risk  (07/19/2022)   Overall Financial Resource Strain (CARDIA)    Difficulty of Paying Living Expenses: Not hard at all  Food Insecurity: No Food Insecurity (07/19/2022)   Hunger Vital Sign    Worried About Running Out of Food  in the Last Year: Never true    Ran Out of Food in the Last Year: Never true  Transportation Needs: No Transportation Needs (07/19/2022)   PRAPARE - Hydrologist (Medical): No    Lack of Transportation (Non-Medical): No  Physical Activity: Sufficiently Active (07/19/2022)   Exercise Vital Sign    Days of Exercise per Week: 4 days    Minutes of Exercise per Session: 60 min  Stress: No Stress Concern Present (07/19/2022)   [place]    Feeling of Stress : Not at all  Social Connections: [place] (07/19/2022)   Social Connection and Isolation Panel [NHANES]    Frequency of Communication with Friends and Family: More than three times a week    Frequency of Social Gatherings with Friends and Family: More than three times a week    Attends Religious Services: More than 4 times per year    Active Member of [employer] or Organizations: Yes    Attends Music therapist: More than 4 times per year    Marital Status: Married     Family History: The patient's family history includes Arthritis in her father and mother; Bone cancer in her mother; Breast cancer in her maternal aunt and mother; Cancer in her brother and mother; Cancer - Prostate in her brother; Diabetes in an other family member; Hyperlipidemia in her maternal grandmother; Osteoarthritis in her sister; Other in her son. There is no history of Colon cancer or Colon polyps. She was adopted.  ROS:   Please see the history of present illness.     EKGs/Labs/Other Studies Reviewed:    Recent Labs: 03/28/2022: TSH 2.91 08/01/2022: ALT 12; BUN 15; Creatinine, Ser 0.92; Potassium 4.5; Sodium 143   Recent Lipid Panel Lab Results  Component Value Date/Time   CHOL 157 08/01/2022 11:42 AM   TRIG 110 08/01/2022 11:42 AM   TRIG 117 08/03/2009 12:00 AM   HDL 49 08/01/2022 11:42 AM   LDLCALC 88 08/01/2022 11:42 AM   LDLDIRECT 193.0  07/13/2010 12:00 AM    Physical Exam:    VS:  BP (!) 154/80   Pulse 68   Ht '4\' 10"'$  (1.473 m)   Wt 130 lb (59 kg)   SpO2 97%   BMI 27.17 kg/m    No data found.  Wt Readings from Last 3 Encounters:  01/02/23 130 lb (59 kg)  11/07/22 134 lb (60.8 kg)  08/21/22 131 lb 6 oz (59.6 kg)     GEN:  Well nourished, well developed in no acute distress HEENT: Normal NECK: No JVD; No carotid bruits CARDIAC: RRR, no murmurs, rubs, gallops RESPIRATORY:  Clear to auscultation without rales, wheezing or rhonchi  ABDOMEN: Soft, non-tender, non-distended MUSCULOSKELETAL: No edema SKIN: Warm and dry NEUROLOGIC:  Alert and oriented PSYCHIATRIC:  Normal affect     ASSESSMENT AND PLAN   CAD without angina -Multivessel coronary artery disease with heavy calcification and CAC 500, nonobstructive coronary disease by cath (11/2018) with 20% mid LAD stenosis and 15% proximal to mid LAD stenosis and intramyocardial bridge of the mid LAD  -Continue aspirin, statin and beta-blocker therapy.  Hypertension, BP elevated today. -Recommend she check her blood pressure once weekly -at her age, goal to keep less than 150/90. -Continue lisinopril 10 mg daily and Toprol.  She had normal kidney function and potassium in August.  Hyperlipidemia with LDL less than 70 -Check fasting lipids today after increased Lipitor dose 3 months ago.  Disposition - Follow-up in 1 year with Dr. Debara Pickett.   Medication Adjustments/Labs and Tests Ordered: Current medicines are reviewed at length with the patient today.  Concerns regarding medicines are outlined above.  Orders Placed This Encounter  Procedures   Lipid panel   No orders of the defined types were placed in this encounter.   Patient Instructions  Medication Instructions:  No Changes *If you need a refill on your cardiac medications before your next appointment, please call your pharmacy*   Lab Work: Lipid Panel  If you have labs (blood work) drawn today  and your tests are completely normal, you will receive your results only by: [place] (if you have MyChart) OR A paper copy in the mail If you have any lab test that is abnormal or we need to change your treatment, we will call you to review the results.   Testing/Procedures: No Testing   Follow-Up: At [facility], you and your health needs are our priority.  As part of our continuing mission to provide you with exceptional heart care, we have created designated Provider Care Teams.  These Care Teams include your primary Cardiologist (physician) and Advanced Practice Providers (APPs -  Physician Assistants and Nurse Practitioners) who all work together to provide you with the care you need, when you need it.  We recommend signing up for the patient portal called "MyChart".  Sign up information is provided on this After Visit Summary.  MyChart is used to connect with patients for Virtual Visits (Telemedicine).  Patients are able to view lab/test results, encounter notes, upcoming appointments, etc.  Non-urgent messages can be sent to your provider as well.   To learn more about what you can do with MyChart, go to [email or web address].    Your next appointment:   1 year(s)  The format for your next appointment:   In Person  Provider:   Pixie Casino, MD     Other Instructions Monitor Blood Pressure 1 Time weekly.  Blood Pressure Goal  Below 150/90.    Signed, Warren Lacy, PA-C  01/02/2023 9:41 AM    [place]

## 2023-01-02 ENCOUNTER — Ambulatory Visit: Payer: Medicare PPO | Attending: General Practice | Admitting: Physician Assistant

## 2023-01-02 ENCOUNTER — Encounter: Payer: Self-pay | Admitting: Physician Assistant

## 2023-01-02 VITALS — BP 154/80 | HR 68 | Ht <= 58 in | Wt 130.0 lb

## 2023-01-02 DIAGNOSIS — E782 Mixed hyperlipidemia: Secondary | ICD-10-CM | POA: Diagnosis not present

## 2023-01-02 DIAGNOSIS — I10 Essential (primary) hypertension: Secondary | ICD-10-CM

## 2023-01-02 DIAGNOSIS — R931 Abnormal findings on diagnostic imaging of heart and coronary circulation: Secondary | ICD-10-CM | POA: Diagnosis not present

## 2023-01-02 LAB — LIPID PANEL
Chol/HDL Ratio: 3.2 ratio (ref 0.0–4.4)
Cholesterol, Total: 171 mg/dL (ref 100–199)
HDL: 53 mg/dL (ref >39)
LDL Chol Calc (NIH): 100 mg/dL — ABNORMAL HIGH (ref 0–99)
Triglycerides: 101 mg/dL (ref 0–149)
VLDL Cholesterol Cal: 18 mg/dL (ref 5–40)

## 2023-01-02 NOTE — Patient Instructions (Signed)
Medication Instructions:  No Changes *If you need a refill on your cardiac medications before your next appointment, please call your pharmacy*   Lab Work: Lipid Panel  If you have labs (blood work) drawn today and your tests are completely normal, you will receive your results only by: [place] (if you have MyChart) OR A paper copy in the mail If you have any lab test that is abnormal or we need to change your treatment, we will call you to review the results.   Testing/Procedures: No Testing   Follow-Up: At [facility], you and your health needs are our priority.  As part of our continuing mission to provide you with exceptional heart care, we have created designated Provider Care Teams.  These Care Teams include your primary Cardiologist (physician) and Advanced Practice Providers (APPs -  Physician Assistants and Nurse Practitioners) who all work together to provide you with the care you need, when you need it.  We recommend signing up for the patient portal called "MyChart".  Sign up information is provided on this After Visit Summary.  MyChart is used to connect with patients for Virtual Visits (Telemedicine).  Patients are able to view lab/test results, encounter notes, upcoming appointments, etc.  Non-urgent messages can be sent to your provider as well.   To learn more about what you can do with MyChart, go to [email or web address].    Your next appointment:   1 year(s)  The format for your next appointment:   In Person  Provider:   Pixie Casino, MD     Other Instructions Monitor Blood Pressure 1 Time weekly.  Blood Pressure Goal  Below 150/90.

## 2023-01-03 DIAGNOSIS — M542 Cervicalgia: Secondary | ICD-10-CM | POA: Diagnosis not present

## 2023-01-08 DIAGNOSIS — M542 Cervicalgia: Secondary | ICD-10-CM | POA: Diagnosis not present

## 2023-01-09 ENCOUNTER — Telehealth: Payer: Self-pay

## 2023-01-09 ENCOUNTER — Encounter: Payer: Self-pay | Admitting: Internal Medicine

## 2023-01-09 DIAGNOSIS — E782 Mixed hyperlipidemia: Secondary | ICD-10-CM

## 2023-01-09 NOTE — Telephone Encounter (Addendum)
Called patient regarding results. Patient had understanding of results. Patient advised to take medication daily. Repeat Lipid and Hepatic panel in 3 months.----- Message from Warren Lacy, PA-C sent at 01/07/2023  3:17 PM EST ----- Lipids checked after Lipitor increased from '40mg'$  to '80mg'$ .   LDL has actually increased from 88 to 100.  Have you been taking Lipitor '80mg'$  daily for at least 6-8 weeks?    If so, we should switch to Crestor '40mg'$  daily for a stronger cholesterol medication.

## 2023-01-10 DIAGNOSIS — M542 Cervicalgia: Secondary | ICD-10-CM | POA: Diagnosis not present

## 2023-01-16 NOTE — Telephone Encounter (Signed)
Pt has picked up form

## 2023-01-22 NOTE — Telephone Encounter (Signed)
Prolia VOB initiated via [email or web address]  Last Prolia inj 08/13/22 Next Prolia inj due 02/14/23

## 2023-02-10 NOTE — Telephone Encounter (Signed)
Prior auth required for PROLIA ? ?PA PROCESS DETAILS: PA is required. PA can be initiated by calling [phone] or online at ?[email or web address] ? ?

## 2023-02-14 ENCOUNTER — Ambulatory Visit (INDEPENDENT_AMBULATORY_CARE_PROVIDER_SITE_OTHER): Payer: Medicare PPO

## 2023-02-14 DIAGNOSIS — M810 Age-related osteoporosis without current pathological fracture: Secondary | ICD-10-CM

## 2023-02-14 MED ORDER — DENOSUMAB 60 MG/ML ~~LOC~~ SOSY
60.0000 mg | PREFILLED_SYRINGE | Freq: Once | SUBCUTANEOUS | Status: AC
  Administered 2023-02-14: 60 mg via SUBCUTANEOUS

## 2023-02-14 NOTE — Progress Notes (Signed)
After obtaining consent, and per orders of Dr. [name], injection of Prolia given by Marrian Salvage. Patient instructed to report any adverse reaction to me immediately.

## 2023-02-26 NOTE — Telephone Encounter (Signed)
Prior Authorization initiated for PROLIA via [email or web address] KEY: [id number]

## 2023-02-28 NOTE — Telephone Encounter (Signed)
Last Prolia inj: 02/14/23 Next Prolia inj DUE: 08/14/23

## 2023-04-22 ENCOUNTER — Telehealth: Payer: Self-pay | Admitting: Internal Medicine

## 2023-04-22 NOTE — Telephone Encounter (Signed)
Contacted Regina Ramsey to schedule their annual wellness visit. Patient declined to schedule AWV at this time.  [facility] Care Guide [facility] AWV TEAM Direct Dial: [phone]

## 2023-04-28 NOTE — Patient Instructions (Addendum)
Restart taking calcium and vitamin D   Blood work was ordered.   The lab is on the first floor.    Medications changes include :   none    Return in about 1 year (around 04/28/2024) for Physical Exam.    Gustatory rhinitis Gustatory rhinitis occurs when the nasal nerves are hypersensitive to environmental triggers.  Gustatory rhinitis is a condition that specifically causes a person to sneeze after eating. Rhinitis is a general term for irritation or swelling that happens in the nose.  Gustatory rhinitis is not related to allergies, so it is known as nonallergic rhinitis. It happens when the nasal nerves are hypersensitive to environmental triggers.  Symptoms of gustatory rhinitis usually come on within minutes of eating and can include:  sneezing a runny nose nasal congestion or stuffiness Gustatory rhinitis is especially common after eating spicy or hot foods including:  hot peppers curry wasabi hot soups  According to an article in the journal Current Opinions in Otolaryngology and Head and Neck SurgeryTrusted Source, there are special receptors in the lining of the nose that detect capsaicin, a compound found in chili peppers. When these fibers detect the presence of capsaicin, they can trigger one or more sneezes.  A person can prevent these symptoms by avoiding trigger foods. They may wish to keep a food and symptom diary to find out what foods cause their gustatory rhinitis.    Health Maintenance, Female Adopting a healthy lifestyle and getting preventive care are important in promoting health and wellness. Ask your health care provider about: The right schedule for you to have regular tests and exams. Things you can do on your own to prevent diseases and keep yourself healthy. What should I know about diet, weight, and exercise? Eat a healthy diet  Eat a diet that includes plenty of vegetables, fruits, low-fat dairy products, and lean protein. Do not eat  a lot of foods that are high in solid fats, added sugars, or sodium. Maintain a healthy weight Body mass index (BMI) is used to identify weight problems. It estimates body fat based on height and weight. Your health care provider can help determine your BMI and help you achieve or maintain a healthy weight. Get regular exercise Get regular exercise. This is one of the most important things you can do for your health. Most adults should: Exercise for at least 150 minutes each week. The exercise should increase your heart rate and make you sweat (moderate-intensity exercise). Do strengthening exercises at least twice a week. This is in addition to the moderate-intensity exercise. Spend less time sitting. Even light physical activity can be beneficial. Watch cholesterol and blood lipids Have your blood tested for lipids and cholesterol at 82 years of age, then have this test every 5 years. Have your cholesterol levels checked more often if: Your lipid or cholesterol levels are high. You are older than 82 years of age. You are at high risk for heart disease. What should I know about cancer screening? Depending on your health history and family history, you may need to have cancer screening at various ages. This may include screening for: Breast cancer. Cervical cancer. Colorectal cancer. Skin cancer. Lung cancer. What should I know about heart disease, diabetes, and high blood pressure? Blood pressure and heart disease High blood pressure causes heart disease and increases the risk of stroke. This is more likely to develop in people who have high blood pressure readings or are overweight. Have your blood  pressure checked: Every 3-5 years if you are 78-12 years of age. Every year if you are 82 years old or older. Diabetes Have regular diabetes screenings. This checks your fasting blood sugar level. Have the screening done: Once every three years after age 79 if you are at a normal weight and  have a low risk for diabetes. More often and at a younger age if you are overweight or have a high risk for diabetes. What should I know about preventing infection? Hepatitis B If you have a higher risk for hepatitis B, you should be screened for this virus. Talk with your health care provider to find out if you are at risk for hepatitis B infection. Hepatitis C Testing is recommended for: Everyone born from [birth year] through 1965. Anyone with known risk factors for hepatitis C. Sexually transmitted infections (STIs) Get screened for STIs, including gonorrhea and chlamydia, if: You are sexually active and are younger than 82 years of age. You are older than 82 years of age and your health care provider tells you that you are at risk for this type of infection. Your sexual activity has changed since you were last screened, and you are at increased risk for chlamydia or gonorrhea. Ask your health care provider if you are at risk. Ask your health care provider about whether you are at high risk for HIV. Your health care provider may recommend a prescription medicine to help prevent HIV infection. If you choose to take medicine to prevent HIV, you should first get tested for HIV. You should then be tested every 3 months for as long as you are taking the medicine. Pregnancy If you are about to stop having your period (premenopausal) and you may become pregnant, seek counseling before you get pregnant. Take 400 to 800 micrograms (mcg) of folic acid every day if you become pregnant. Ask for birth control (contraception) if you want to prevent pregnancy. Osteoporosis and menopause Osteoporosis is a disease in which the bones lose minerals and strength with aging. This can result in bone fractures. If you are 41 years old or older, or if you are at risk for osteoporosis and fractures, ask your health care provider if you should: Be screened for bone loss. Take a calcium or vitamin D supplement to lower your  risk of fractures. Be given hormone replacement therapy (HRT) to treat symptoms of menopause. Follow these instructions at home: Alcohol use Do not drink alcohol if: Your health care provider tells you not to drink. You are pregnant, may be pregnant, or are planning to become pregnant. If you drink alcohol: Limit how much you have to: 0-1 drink a day. Know how much alcohol is in your drink. In the [place] one drink equals one 12 oz bottle of beer (355 mL), one 5 oz glass of wine (148 mL), or one 1 oz glass of hard liquor (44 mL). Lifestyle Do not use any products that contain nicotine or tobacco. These products include cigarettes, chewing tobacco, and vaping devices, such as e-cigarettes. If you need help quitting, ask your health care provider. Do not use street drugs. Do not share needles. Ask your health care provider for help if you need support or information about quitting drugs. General instructions Schedule regular health, dental, and eye exams. Stay current with your vaccines. Tell your health care provider if: You often feel depressed. You have ever been abused or do not feel safe at home. Summary Adopting a healthy lifestyle and getting preventive care are  important in promoting health and wellness. Follow your health care provider's instructions about healthy diet, exercising, and getting tested or screened for diseases. Follow your health care provider's instructions on monitoring your cholesterol and blood pressure. This information is not intended to replace advice given to you by your health care provider. Make sure you discuss any questions you have with your health care provider. Document Revised: 05/08/2021 Document Reviewed: 05/08/2021 Elsevier Patient Education  [place].

## 2023-04-28 NOTE — Progress Notes (Unsigned)
Subjective:    Patient ID: Regina Ramsey, female    DOB: 30-Mar-1941, 82 y.o.   MRN: [id number]      HPI Adell is here for a Physical exam and her chronic medical problems.    Overall doing well.  She has no concerns.    Medications and allergies reviewed with patient and updated if appropriate.  Current Outpatient Medications on File Prior to Visit  Medication Sig Dispense Refill   aspirin EC 81 MG tablet Take 81 mg by mouth daily.     atorvastatin (LIPITOR) 40 MG tablet Take 2 tablets (80 mg total) by mouth daily. TAKE 1 TABLET(40 MG) BY MOUTH DAILY AT 6 PM 180 tablet 3   lisinopril (ZESTRIL) 5 MG tablet Take 2 tablets (10 mg total) by mouth daily. 180 tablet 3   No current facility-administered medications on file prior to visit.    Review of Systems  Constitutional:  Negative for fever.  Eyes:  Negative for visual disturbance.  Respiratory:  Negative for cough, shortness of breath and wheezing.   Cardiovascular:  Negative for chest pain, palpitations and leg swelling.  Gastrointestinal:  Positive for diarrhea. Negative for abdominal pain, blood in stool and constipation.       No gerd  Genitourinary:  Negative for dysuria.  Musculoskeletal:  Positive for arthralgias and back pain.  Skin:  Negative for rash.  Neurological:  Negative for light-headedness and headaches.  Psychiatric/Behavioral:  Negative for dysphoric mood. The patient is not nervous/anxious.        Objective:   Vitals:   04/29/23 1012  BP: 130/78  Pulse: 65  Temp: 98 F (36.7 C)  SpO2: 96%   Filed Weights   04/29/23 1012  Weight: 131 lb (59.4 kg)   Body mass index is 27.38 kg/m.  BP Readings from Last 3 Encounters:  04/29/23 130/78  01/02/23 (!) 154/80  11/07/22 118/70    Wt Readings from Last 3 Encounters:  04/29/23 131 lb (59.4 kg)  01/02/23 130 lb (59 kg)  11/07/22 134 lb (60.8 kg)       Physical Exam Constitutional: She appears well-developed and well-nourished.  No distress.  HENT:  Head: Normocephalic and atraumatic.  Right Ear: External ear normal. Normal ear canal and TM Left Ear: External ear normal.  Normal ear canal and TM Mouth/Throat: Oropharynx is clear and moist.  Eyes: Conjunctivae normal.  Neck: Neck supple. No tracheal deviation present. No thyromegaly present.  No carotid bruit  Cardiovascular: Normal rate, regular rhythm and normal heart sounds.   No murmur heard.  No edema. Pulmonary/Chest: Effort normal and breath sounds normal. No respiratory distress. She has no wheezes. She has no rales.  Breast: deferred   Abdominal: Soft. She exhibits no distension. There is no tenderness.  Lymphadenopathy: She has no cervical adenopathy.  Skin: Skin is warm and dry. She is not diaphoretic.  Psychiatric: She has a normal mood and affect. Her behavior is normal.     Lab Results  Component Value Date   WBC 6.6 09/18/2021   HGB 13.2 09/18/2021   HCT 40.5 09/18/2021   PLT 239.0 09/18/2021   GLUCOSE 104 (H) 08/01/2022   CHOL 171 01/02/2023   TRIG 101 01/02/2023   HDL 53 01/02/2023   LDLDIRECT 193.0 07/13/2010   LDLCALC 100 (H) 01/02/2023   ALT 12 08/01/2022   AST 17 08/01/2022   NA 143 08/01/2022   K 4.5 08/01/2022   CL 104 08/01/2022   CREATININE 0.92 08/01/2022  BUN 15 08/01/2022   CO2 25 08/01/2022   TSH 2.91 03/28/2022   HGBA1C 6.0 09/18/2021         Assessment & Plan:   Physical exam: Screening blood work  ordered Exercise  regular - [facility] Weight  normal Substance abuse  none   Reviewed recommended immunizations.   Health Maintenance  Topic Date Due   COVID-19 Vaccine (5 - 2023-24 season) 05/15/2023 (Originally 11/26/2022)   Zoster Vaccines- Shingrix (1 of 2) 07/29/2023 (Originally 03/29/1960)   Medicare Annual Wellness (AWV)  07/20/2023   INFLUENZA VACCINE  08/01/2023   DEXA SCAN  05/08/2024   DTaP/Tdap/Td (3 - Td or Tdap) 07/18/2025   Pneumonia Vaccine 33+ Years old  Completed   HPV VACCINES   Aged Out   COLONOSCOPY (Pts 45-54yrs Insurance coverage will need to be confirmed)  Discontinued          See Problem List for Assessment and Plan of chronic medical problems.

## 2023-04-29 ENCOUNTER — Encounter: Payer: Self-pay | Admitting: Internal Medicine

## 2023-04-29 ENCOUNTER — Ambulatory Visit: Payer: Medicare PPO | Admitting: Internal Medicine

## 2023-04-29 VITALS — BP 130/78 | HR 65 | Temp 98.0°F | Ht <= 58 in | Wt 131.0 lb

## 2023-04-29 DIAGNOSIS — M810 Age-related osteoporosis without current pathological fracture: Secondary | ICD-10-CM

## 2023-04-29 DIAGNOSIS — R7303 Prediabetes: Secondary | ICD-10-CM | POA: Diagnosis not present

## 2023-04-29 DIAGNOSIS — E538 Deficiency of other specified B group vitamins: Secondary | ICD-10-CM | POA: Diagnosis not present

## 2023-04-29 DIAGNOSIS — I10 Essential (primary) hypertension: Secondary | ICD-10-CM

## 2023-04-29 DIAGNOSIS — Z0000 Encounter for general adult medical examination without abnormal findings: Secondary | ICD-10-CM | POA: Diagnosis not present

## 2023-04-29 DIAGNOSIS — I2510 Atherosclerotic heart disease of native coronary artery without angina pectoris: Secondary | ICD-10-CM | POA: Diagnosis not present

## 2023-04-29 DIAGNOSIS — K219 Gastro-esophageal reflux disease without esophagitis: Secondary | ICD-10-CM | POA: Diagnosis not present

## 2023-04-29 DIAGNOSIS — E782 Mixed hyperlipidemia: Secondary | ICD-10-CM

## 2023-04-29 LAB — COMPREHENSIVE METABOLIC PANEL
ALT: 17 U/L (ref 0–35)
AST: 20 U/L (ref 0–37)
Albumin: 4 g/dL (ref 3.5–5.2)
Alkaline Phosphatase: 54 U/L (ref 39–117)
BUN: 19 mg/dL (ref 6–23)
CO2: 29 mEq/L (ref 19–32)
Calcium: 9.6 mg/dL (ref 8.4–10.5)
Chloride: 104 mEq/L (ref 96–112)
Creatinine, Ser: 0.83 mg/dL (ref 0.40–1.20)
GFR: 65.81 mL/min (ref >60.00)
Glucose, Bld: 94 mg/dL (ref 70–99)
Potassium: 4.4 mEq/L (ref 3.5–5.1)
Sodium: 141 mEq/L (ref 135–145)
Total Bilirubin: 1.1 mg/dL (ref 0.2–1.2)
Total Protein: 6.7 g/dL (ref 6.0–8.3)

## 2023-04-29 LAB — CBC WITH DIFFERENTIAL/PLATELET
Basophils Absolute: 0.1 10*3/uL (ref 0.0–0.1)
Basophils Relative: 0.8 % (ref 0.0–3.0)
Eosinophils Absolute: 0.2 10*3/uL (ref 0.0–0.7)
Eosinophils Relative: 2.7 % (ref 0.0–5.0)
HCT: 39.4 % (ref 36.0–46.0)
Hemoglobin: 13.4 g/dL (ref 12.0–15.0)
Lymphocytes Relative: 28.4 % (ref 12.0–46.0)
Lymphs Abs: 1.8 10*3/uL (ref 0.7–4.0)
MCHC: 34.1 g/dL (ref 30.0–36.0)
MCV: 96.3 fl (ref 78.0–100.0)
Monocytes Absolute: 0.7 10*3/uL (ref 0.1–1.0)
Monocytes Relative: 11.5 % (ref 3.0–12.0)
Neutro Abs: 3.5 10*3/uL (ref 1.4–7.7)
Neutrophils Relative %: 56.6 % (ref 43.0–77.0)
Platelets: 200 10*3/uL (ref 150.0–400.0)
RBC: 4.09 Mil/uL (ref 3.87–5.11)
RDW: 13 % (ref 11.5–15.5)
WBC: 6.2 10*3/uL (ref 4.0–10.5)

## 2023-04-29 LAB — LIPID PANEL
Cholesterol: 132 mg/dL (ref 0–200)
HDL: 42.9 mg/dL (ref >39.00)
LDL Cholesterol: 66 mg/dL (ref 0–99)
NonHDL: 89.51
Total CHOL/HDL Ratio: 3
Triglycerides: 119 mg/dL (ref 0.0–149.0)
VLDL: 23.8 mg/dL (ref 0.0–40.0)

## 2023-04-29 LAB — VITAMIN D 25 HYDROXY (VIT D DEFICIENCY, FRACTURES): VITD: 10.69 ng/mL — ABNORMAL LOW (ref 30.00–100.00)

## 2023-04-29 LAB — VITAMIN B12: Vitamin B-12: 201 pg/mL — ABNORMAL LOW (ref 211–911)

## 2023-04-29 LAB — HEMOGLOBIN A1C: Hgb A1c MFr Bld: 6.2 % (ref 4.6–6.5)

## 2023-04-29 LAB — TSH: TSH: 2.61 u[IU]/mL (ref 0.35–5.50)

## 2023-04-29 MED ORDER — METOPROLOL SUCCINATE ER 50 MG PO TB24
50.0000 mg | ORAL_TABLET | Freq: Every day | ORAL | 3 refills | Status: DC
  Filled 2023-12-23 – 2024-03-11 (×2): qty 90, 90d supply, fill #0

## 2023-04-29 NOTE — Assessment & Plan Note (Addendum)
Chronic Regular exercise and healthy diet encouraged Lipid panel done recently-reviewed Continue atorvastatin 80 mg daily

## 2023-04-29 NOTE — Assessment & Plan Note (Addendum)
Chronic DEXA last done 04/2022-reviewed that with her today Continue Prolia every 6 months-last Prolia was 01/2023 Discussed need for medication Stressed calcium and vitamin D supplementation-she is not currently taking and will restart Stressed regular exercise

## 2023-04-29 NOTE — Assessment & Plan Note (Signed)
Chronic Not currently taking any B12 Check level

## 2023-04-29 NOTE — Assessment & Plan Note (Signed)
Chronic Check a1c Low sugar / carb diet Stressed regular exercise  

## 2023-04-29 NOTE — Assessment & Plan Note (Addendum)
Chronic No chest pain, palpitations or shortness of breath Following with cardiology Continue aspirin 81 mg, atorvastatin 80 mg daily, metoprolol XL 50 mg daily and lisinopril 10 mg daily

## 2023-04-29 NOTE — Assessment & Plan Note (Addendum)
Chronic Has decreased how much she eats GERD has been controlled - only occasional GERD No longer taking pantoprazole 40 mg Continue Tums as needed

## 2023-04-29 NOTE — Assessment & Plan Note (Signed)
Chronic Blood pressure well controlled CMP Continue lisinopril 10 mg daily, metoprolol XL 50 mg daily

## 2023-06-18 DIAGNOSIS — M79675 Pain in left toe(s): Secondary | ICD-10-CM | POA: Diagnosis not present

## 2023-09-03 ENCOUNTER — Other Ambulatory Visit: Payer: Self-pay | Admitting: Internal Medicine

## 2023-09-24 ENCOUNTER — Ambulatory Visit (INDEPENDENT_AMBULATORY_CARE_PROVIDER_SITE_OTHER): Payer: Medicare PPO

## 2023-09-24 VITALS — Ht <= 58 in | Wt 131.0 lb

## 2023-09-24 DIAGNOSIS — Z0000 Encounter for general adult medical examination without abnormal findings: Secondary | ICD-10-CM

## 2023-09-24 NOTE — Patient Instructions (Addendum)
Regina Ramsey , Thank you for taking time to come for your Medicare Wellness Visit. I appreciate your ongoing commitment to your health goals. Please review the following plan we discussed and let me know if I can assist you in the future.   Referrals/Orders/Follow-Ups/Clinician Recommendations: You are due for the Shingles vaccine and the Flu vaccine.  Each day, aim for 6 glasses of water, plenty of protein in your diet and try to get up and walk/ stretch every hour for 5-10 minutes at a time.  It was nice speaking to you today.  This is a list of the screening recommended for you and due dates:  Health Maintenance  Topic Date Due   Zoster (Shingles) Vaccine (1 of 2) 03/29/1960   Flu Shot  08/01/2023   COVID-19 Vaccine (5 - 2023-24 season) 09/01/2023   DEXA scan (bone density measurement)  05/08/2024   Medicare Annual Wellness Visit  09/23/2024   DTaP/Tdap/Td vaccine (3 - Td or Tdap) 07/18/2025   Pneumonia Vaccine  Completed   HPV Vaccine  Aged Out   Colon Cancer Screening  Discontinued    Advanced directives: (Copy Requested) Please bring a copy of your health care power of attorney and living will to the office to be added to your chart at your convenience.  Next Medicare Annual Wellness Visit scheduled for next year: Yes

## 2023-09-24 NOTE — Progress Notes (Signed)
Subjective:   Regina Ramsey is a 82 y.o. female who presents for Medicare Annual (Subsequent) preventive examination.  Visit Complete: Virtual  I connected with  Regina Ramsey on 09/24/23 by a audio enabled telemedicine application and verified that I am speaking with the correct person using two identifiers.  Patient Location: Home  Provider Location: Home Office  I discussed the limitations of evaluation and management by telemedicine. The patient expressed understanding and agreed to proceed.  Vital Signs: Because this visit was a virtual/telehealth visit, some criteria may be missing or patient reported. Any vitals not documented were not able to be obtained and vitals that have been documented are patient reported.    Cardiac Risk Factors include: advanced age (>22men, >51 women);hypertension;dyslipidemia;Other (see comment), Risk factor comments: CAD, Osteoporosis     Objective:    Today's Vitals   09/24/23 1430  Weight: 131 lb (59.4 kg)  Height: 4\' 10"  (1.473 m)   Body mass index is 27.38 kg/m.     09/24/2023    2:37 PM 07/19/2022    2:29 PM 03/28/2022    8:50 AM 11/16/2019    1:32 PM 10/18/2016   11:46 AM  Advanced Directives  Does Patient Have a Medical Advance Directive? Yes Yes Yes Yes Yes  Type of Estate agent of [place];Living will Living will;Healthcare Power of Attorney Living will Living will;Healthcare Power of [employer] Power of [place];Living will  Does patient want to make changes to medical advance directive?  No - Patient declined   No - Patient declined  Copy of Healthcare Power of Attorney in Chart? No - copy requested No - copy requested  No - copy requested, Physician notified No - copy requested    Current Medications (verified) Outpatient Encounter Medications as of 09/24/2023  Medication Sig   aspirin EC 81 MG tablet Take 81 mg by mouth daily.   atorvastatin (LIPITOR) 40 MG tablet Take 2 tablets (80  mg total) by mouth daily. TAKE 1 TABLET(40 MG) BY MOUTH DAILY AT 6 PM   lisinopril (ZESTRIL) 5 MG tablet TAKE 2 TABLETS(10 MG) BY MOUTH DAILY   metoprolol succinate (TOPROL-XL) 50 MG 24 hr tablet TAKE 1 TABLET(50 MG) BY MOUTH DAILY   No facility-administered encounter medications on file as of 09/24/2023.    Allergies (verified) Short ragweed pollen ext   History: Past Medical History:  Diagnosis Date   ALLERGIC RHINITIS    ANEMIA-NOS    Anxiety    Asthma    Triggered by allergies   COPD    normal PFTs 06/22/11   Diverticulosis of colon    Dyspareunia    Endometriosis    GERD    HEARING LOSS 07/13/2010   HYPERLIPIDEMIA    HYPERTENSION    Irritable bowel syndrome    MIGRAINE HEADACHE    OSTEOPOROSIS    on Prolia q 42mo, hx vertebral fx   PALPITATIONS, HX OF    Vaginal atrophy    Past Surgical History:  Procedure Laterality Date   ABDOMINAL HYSTERECTOMY  12/31/1972   APPENDECTOMY  12/31/1956   BREAST SURGERY  01/01/1984   breast biopsy   CATARACT EXTRACTION, BILATERAL     LEFT HEART CATH AND CORONARY ANGIOGRAPHY N/A 12/03/2018   Procedure: LEFT HEART CATH AND CORONARY ANGIOGRAPHY;  Surgeon: Lennette Bihari, MD;  Location: [facility];  Service: Cardiovascular;  Laterality: N/A;   Family History  Adopted: Yes  Problem Relation Age of Onset   Arthritis Mother  Breast cancer Mother    Cancer Mother        started on her spine met cancer   Bone cancer Mother    Arthritis Father    Osteoarthritis Sister    Cancer - Prostate Brother    Cancer Brother    Hyperlipidemia Maternal Grandmother    Other Son        tachycardia   Breast cancer Maternal Aunt    Diabetes Other        parent & grandparent   Colon cancer Neg Hx    Colon polyps Neg Hx    Social History   Socioeconomic History   Marital status: Married    Spouse name: Earlene Plater   Number of children: 2   Years of education: Not on file   Highest education level: Master's degree (e.g., MA, MS, MEng,  MEd, MSW, MBA)  Occupational History   Occupation: Retired   Occupation: retired    Comment: Runner, broadcasting/film/video  Tobacco Use   Smoking status: Former    Current packs/day: 0.00    Types: Cigarettes    Quit date: 01/01/1964    Years since quitting: 59.7   Smokeless tobacco: Never   Tobacco comments:    Married, lives with souse. 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      No regular exercise, active      epworth sleepiness scale = 10 (05/30/16)   Social Determinants of Health   Financial Resource Strain: Low Risk  (09/24/2023)   Overall Financial Resource Strain (CARDIA)    Difficulty of Paying Living Expenses: Not hard at all  Food Insecurity: No Food Insecurity (09/24/2023)   Hunger Vital Sign    Worried About Running Out of Food in the Last Year: Never true    Ran Out of Food in the Last Year: Never true  Transportation Needs: No Transportation Needs (09/24/2023)   PRAPARE - Administrator, Civil Service (Medical): No    Lack of Transportation (Non-Medical): No  Physical Activity: Inactive (09/24/2023)   Exercise Vital Sign    Days of Exercise per Week: 0 days    Minutes of Exercise per Session: 0 min  Stress: No Stress Concern Present (09/24/2023)   [employer] of Occupational Health - Occupational Stress Questionnaire    Feeling of Stress : Not at all  Social Connections: Socially Integrated (09/24/2023)   Social Connection and Isolation Panel [NHANES]    Frequency of Communication with Friends and Family: More than three times a week    Frequency of Social Gatherings with Friends and Family: More  than three times a week    Attends Religious Services: More than 4 times per year    Active Member of [employer] or Organizations: Yes    Attends Banker Meetings: 1 to 4 times per year    Marital Status: Married    Tobacco Counseling Counseling given: Not Answered Tobacco comments: Married, lives with souse. Master of education-specialist needs for grade school   Clinical Intake:  Pre-visit preparation completed: Yes  Pain :  No/denies pain     BMI - recorded: 27.38 Nutritional Status: BMI 25 -29 Overweight Nutritional Risks: None Diabetes: No  How often do you need to have someone help you when you read instructions, pamphlets, or other written materials from your doctor or pharmacy?: 1 - Never  Interpreter Needed?: No  Information entered by :: Girtrude Enslin, RMA   Activities of Daily Living    09/24/2023    2:34 PM  In your present state of health, do you have any difficulty performing the following activities:  Hearing? 1  Comment has hearing aides  Vision? 0  Difficulty concentrating or making decisions? 0  Walking or climbing stairs? 0  Dressing or bathing? 0  Doing errands, shopping? 0  Comment sometimes uses Scientist, research (medical) and eating ? N  Using the Toilet? N  In the past six months, have you accidently leaked urine? N  Do you have problems with loss of bowel control? N  Managing your Medications? N  Managing your Finances? N  Housekeeping or managing your Housekeeping? N    Patient Care Team: Pincus Sanes, MD as PCP - General (Internal Medicine) Rennis Golden Lisette Abu, MD as PCP - Cardiology (Cardiology) Graylin Shiver, MD (Gastroenterology) Janalyn Harder, MD (Inactive) (Dermatology) Rennis Golden Lisette Abu, MD as Consulting Physician (Cardiology) Louis Meckel, MD (Inactive) as Consulting Physician (Gastroenterology) Royden Purl, AUD (Audiology) Genia Del Daisy Blossom, MD as Consulting Physician (Ophthalmology) [facility]  Indicate any recent Medical Services you may have received from other than [facility] providers in the past year (date may be approximate).     Assessment:   This is a routine wellness examination for Amalea.  Hearing/Vision screen Hearing Screening - Comments:: Wears hearing aides. Vision Screening - Comments:: Denies vision issues.   Goals Addressed               This Visit's Progress     <enter goal here> (pt-stated)   On track     Patient would like to be healthy enough to climb mountains in [place]. Plans to increase walking around neighborhood and treadmill at [facility]. Decrease sugary drinks.      Depression Screen    09/24/2023    2:45 PM 04/29/2023   10:21 AM 11/07/2022    1:29 PM 07/19/2022    2:32 PM 04/23/2022   10:22 AM 03/27/2022    8:28 AM 09/18/2021    7:25 PM  PHQ 2/9 Scores  PHQ - 2 Score 0 0 0 0 0 0 0  PHQ- 9 Score 1 0 2    2    Fall Risk    09/24/2023    2:38 PM 04/29/2023   10:21 AM 11/07/2022    1:29 PM 07/19/2022    2:30 PM 04/23/2022   10:22 AM  Fall Risk   Falls in the past year? 1 0 1 1 1   Number falls in past yr: 0 0 0 0 1  Injury with Fall? 0 0 1 0 0  Risk for fall due to : No Fall Risks No Fall Risks History of fall(s) No Fall Risks No Fall Risks  Follow up Falls prevention discussed;Falls evaluation completed Falls evaluation completed Falls evaluation completed Falls evaluation completed Falls evaluation completed    MEDICARE RISK AT HOME: Medicare Risk at Home Any stairs in or around the home?: No Home free of loose throw rugs in walkways, pet beds, electrical cords, etc?: Yes Adequate lighting in your home to reduce  risk of falls?: Yes Life alert?: No Use of a cane, walker or w/c?: Yes (sometimes walker) Grab bars in the bathroom?: Yes Shower chair or bench in shower?: Yes Elevated toilet seat or a handicapped toilet?: Yes  TIMED UP AND GO:  Was the test performed?  No    Cognitive Function:    10/18/2016   11:49 AM  MMSE -  Mini Mental State Exam  Orientation to time 5  Orientation to Place 5  Registration 3  Attention/ Calculation 5  Recall 3  Language- name 2 objects 2  Language- repeat 1  Language- follow 3 step command 3  Language- read & follow direction 1  Write a sentence 1  Copy design 1  Total score 30        09/24/2023    2:40 PM 07/19/2022    3:45 PM  6CIT Screen  What Year? 0 points 0 points  What month? 0 points 0 points  What time? 0 points 0 points  Count back from 20 0 points 0 points  Months in reverse 0 points 0 points  Repeat phrase 0 points 0 points  Total Score 0 points 0 points    Immunizations Immunization History  Administered Date(s) Administered   Fluad Quad(high Dose 65+) 09/11/2019, 09/12/2020, 09/18/2021, 09/26/2022   Influenza, High Dose Seasonal PF 09/17/2018   Influenza,inj,quad, With Preservative 10/01/2019   Influenza-Unspecified 10/16/2018   PFIZER(Purple Top)SARS-COV-2 Vaccination 02/13/2020, 03/06/2020, 10/18/2020   Pfizer Covid-19 Vaccine Bivalent Booster 84yrs & up 10/01/2022   Pneumococcal Conjugate-13 10/11/2015   Pneumococcal Polysaccharide-23 02/07/2012   Td 06/30/2009   Tdap 07/19/2015   Zoster, Live 03/30/2014    TDAP status: Up to date  Flu Vaccine status: Due, Education has been provided regarding the importance of this vaccine. Advised may receive this vaccine at local pharmacy or Health Dept. Aware to provide a copy of the vaccination record if obtained from local pharmacy or Health Dept. Verbalized acceptance and understanding.  Pneumococcal vaccine status: Up to date  Covid-19 vaccine status: Completed vaccines  Qualifies for Shingles Vaccine? Yes   Zostavax completed No   Shingrix Completed?: No.    Education has been provided regarding the importance of this vaccine. Patient has been advised to call insurance company to determine out of pocket expense if they have not yet received this vaccine. Advised may also receive vaccine at  local pharmacy or Health Dept. Verbalized acceptance and understanding.  Screening Tests Health Maintenance  Topic Date Due   Zoster Vaccines- Shingrix (1 of 2) [birth year]/29/1960   INFLUENZA VACCINE  08/01/2023   COVID-19 Vaccine (5 - 2023-24 season) 09/01/2023   DEXA SCAN  05/08/2024   Medicare Annual Wellness (AWV)  09/23/2024   DTaP/Tdap/Td (3 - Td or Tdap) 07/18/2025   Pneumonia Vaccine 68+ Years old  Completed   HPV VACCINES  Aged Out   Colonoscopy  Discontinued    Health Maintenance  Health Maintenance Due  Topic Date Due   Zoster Vaccines- Shingrix (1 of 2) 03/29/1960   INFLUENZA VACCINE  08/01/2023   COVID-19 Vaccine (5 - 2023-24 season) 09/01/2023    Colorectal cancer screening: No longer required.   Mammogram status: No longer required due to age.  Bone Density status: Completed 05/08/2022. Results reflect: Bone density results: OSTEOPOROSIS. Repeat every 2 years.  Lung Cancer Screening: (Low Dose CT Chest recommended if Age 25-80 years, 20 pack-year currently smoking OR have quit w/in 15years.) does not qualify.   Lung Cancer Screening Referral: N/A  Additional  Screening:  Hepatitis C Screening: does not qualify;  Vision Screening: Recommended annual ophthalmology exams for early detection of glaucoma and other disorders of the eye. Is the patient up to date with their annual eye exam?  Yes  Who is the provider or what is the name of the office in which the patient attends annual eye exams? [facility] If pt is not established with a provider, would they like to be referred to a provider to establish care? No .   Dental Screening: Recommended annual dental exams for proper oral hygiene  Community Resource Referral / Chronic Care Management: CRR required this visit?  No   CCM required this visit?  No     Plan:     I have personally reviewed and noted the following in the patient's chart:   Medical and social history Use of alcohol, tobacco or  illicit drugs  Current medications and supplements including opioid prescriptions. Patient is not currently taking opioid prescriptions. Functional ability and status Nutritional status Physical activity Advanced directives List of other physicians Hospitalizations, surgeries, and ER visits in previous 12 months Vitals Screenings to include cognitive, depression, and falls Referrals and appointments  In addition, I have reviewed and discussed with patient certain preventive protocols, quality metrics, and best practice recommendations. A written personalized care plan for preventive services as well as general preventive health recommendations were provided to patient.     Jenisa Monty L Nkechi Linehan, CMA   09/24/2023   After Visit Summary: (MyChart) Due to this being a telephonic visit, the after visit summary with patients personalized plan was offered to patient via MyChart   Nurse Notes: Patient is due for Flu and Shingles vaccine.  She declines any more Covid vaccines.  She is up to date on all other health maintenance.  Patient had no other concerns to address today.

## 2023-11-16 DIAGNOSIS — R0902 Hypoxemia: Secondary | ICD-10-CM | POA: Diagnosis not present

## 2023-11-16 DIAGNOSIS — R0602 Shortness of breath: Secondary | ICD-10-CM | POA: Diagnosis not present

## 2023-11-16 DIAGNOSIS — R000 Tachycardia, unspecified: Secondary | ICD-10-CM | POA: Diagnosis not present

## 2023-11-16 DIAGNOSIS — Z20822 Contact with and (suspected) exposure to covid-19: Secondary | ICD-10-CM | POA: Diagnosis not present

## 2023-11-16 DIAGNOSIS — U071 COVID-19: Secondary | ICD-10-CM | POA: Diagnosis not present

## 2023-11-17 ENCOUNTER — Encounter: Payer: Self-pay | Admitting: Internal Medicine

## 2023-11-17 DIAGNOSIS — U071 COVID-19: Secondary | ICD-10-CM | POA: Insufficient documentation

## 2023-11-17 NOTE — Progress Notes (Deleted)
    Subjective:    Patient ID: Regina Ramsey, female    DOB: [birth year], 82 y.o.   MRN: [id number]      HPI Regina Ramsey is here for No chief complaint on file.   She was diagnosed with Covid 11/16.  Symptoms include     Medications and allergies reviewed with patient and updated if appropriate.  Current Outpatient Medications on File Prior to Visit  Medication Sig Dispense Refill   aspirin EC 81 MG tablet Take 81 mg by mouth daily.     atorvastatin (LIPITOR) 40 MG tablet Take 2 tablets (80 mg total) by mouth daily. TAKE 1 TABLET(40 MG) BY MOUTH DAILY AT 6 PM 180 tablet 3   lisinopril (ZESTRIL) 5 MG tablet TAKE 2 TABLETS(10 MG) BY MOUTH DAILY 180 tablet 3   metoprolol succinate (TOPROL-XL) 50 MG 24 hr tablet TAKE 1 TABLET(50 MG) BY MOUTH DAILY 90 tablet 3   No current facility-administered medications on file prior to visit.    Review of Systems     Objective:  There were no vitals filed for this visit. BP Readings from Last 3 Encounters:  04/29/23 130/78  01/02/23 (!) 154/80  11/07/22 118/70   Wt Readings from Last 3 Encounters:  09/24/23 131 lb (59.4 kg)  04/29/23 131 lb (59.4 kg)  01/02/23 130 lb (59 kg)   There is no height or weight on file to calculate BMI.    Physical Exam Constitutional:      General: She is not in acute distress.    Appearance: Normal appearance. She is not ill-appearing.  HENT:     Head: Normocephalic and atraumatic.     Right Ear: Tympanic membrane, ear canal and external ear normal.     Left Ear: Tympanic membrane, ear canal and external ear normal.     Mouth/Throat:     Mouth: Mucous membranes are moist.     Pharynx: No oropharyngeal exudate or posterior oropharyngeal erythema.  Eyes:     Conjunctiva/sclera: Conjunctivae normal.  Cardiovascular:     Rate and Rhythm: Normal rate and regular rhythm.  Pulmonary:     Effort: Pulmonary effort is normal. No respiratory distress.     Breath sounds: Normal breath sounds. No wheezing  or rales.  Musculoskeletal:     Cervical back: Neck supple. No tenderness.  Lymphadenopathy:     Cervical: No cervical adenopathy.  Skin:    General: Skin is warm and dry.  Neurological:     Mental Status: She is alert.            Assessment & Plan:    See Problem List for Assessment and Plan of chronic medical problems.

## 2023-11-18 ENCOUNTER — Emergency Department (HOSPITAL_COMMUNITY)
Admission: EM | Admit: 2023-11-18 | Discharge: 2023-11-18 | Disposition: A | Discharge status: Home / Self Care | Payer: Medicare PPO

## 2023-11-18 ENCOUNTER — Other Ambulatory Visit: Payer: Self-pay

## 2023-11-18 ENCOUNTER — Encounter (HOSPITAL_COMMUNITY): Payer: Self-pay

## 2023-11-18 ENCOUNTER — Ambulatory Visit: Payer: Medicare PPO | Admitting: Internal Medicine

## 2023-11-18 ENCOUNTER — Emergency Department (HOSPITAL_COMMUNITY): Payer: Medicare PPO

## 2023-11-18 DIAGNOSIS — Z79899 Other long term (current) drug therapy: Secondary | ICD-10-CM | POA: Insufficient documentation

## 2023-11-18 DIAGNOSIS — R9389 Abnormal findings on diagnostic imaging of other specified body structures: Secondary | ICD-10-CM | POA: Diagnosis not present

## 2023-11-18 DIAGNOSIS — R059 Cough, unspecified: Secondary | ICD-10-CM | POA: Diagnosis not present

## 2023-11-18 DIAGNOSIS — U071 COVID-19: Secondary | ICD-10-CM | POA: Insufficient documentation

## 2023-11-18 DIAGNOSIS — I10 Essential (primary) hypertension: Secondary | ICD-10-CM | POA: Insufficient documentation

## 2023-11-18 DIAGNOSIS — J988 Other specified respiratory disorders: Secondary | ICD-10-CM | POA: Diagnosis not present

## 2023-11-18 DIAGNOSIS — W19XXXA Unspecified fall, initial encounter: Secondary | ICD-10-CM | POA: Diagnosis not present

## 2023-11-18 LAB — CBC WITH DIFFERENTIAL/PLATELET
Abs Immature Granulocytes: 0.02 10*3/uL (ref 0.00–0.07)
Basophils Absolute: 0 10*3/uL (ref 0.0–0.1)
Basophils Relative: 0 %
Eosinophils Absolute: 0.1 10*3/uL (ref 0.0–0.5)
Eosinophils Relative: 1 %
HCT: 40.8 % (ref 36.0–46.0)
Hemoglobin: 13 g/dL (ref 12.0–15.0)
Immature Granulocytes: 0 %
Lymphocytes Relative: 17 %
Lymphs Abs: 1.1 10*3/uL (ref 0.7–4.0)
MCH: 31.9 pg (ref 26.0–34.0)
MCHC: 31.9 g/dL (ref 30.0–36.0)
MCV: 100.2 fL — ABNORMAL HIGH (ref 80.0–100.0)
Monocytes Absolute: 0.8 10*3/uL (ref 0.1–1.0)
Monocytes Relative: 11 %
Neutro Abs: 4.8 10*3/uL (ref 1.7–7.7)
Neutrophils Relative %: 71 %
Platelets: 187 10*3/uL (ref 150–400)
RBC: 4.07 MIL/uL (ref 3.87–5.11)
RDW: 12.4 % (ref 11.5–15.5)
WBC: 6.8 10*3/uL (ref 4.0–10.5)
nRBC: 0 % (ref 0.0–0.2)

## 2023-11-18 LAB — BASIC METABOLIC PANEL
Anion gap: 11 (ref 5–15)
BUN: 28 mg/dL — ABNORMAL HIGH (ref 8–23)
CO2: 25 mmol/L (ref 22–32)
Calcium: 9.1 mg/dL (ref 8.9–10.3)
Chloride: 102 mmol/L (ref 98–111)
Creatinine, Ser: 1.05 mg/dL — ABNORMAL HIGH (ref 0.44–1.00)
GFR, Estimated: 53 mL/min — ABNORMAL LOW (ref >60)
Glucose, Bld: 98 mg/dL (ref 70–99)
Potassium: 3.6 mmol/L (ref 3.5–5.1)
Sodium: 138 mmol/L (ref 135–145)

## 2023-11-18 MED ORDER — BENZONATATE 100 MG PO CAPS
100.0000 mg | ORAL_CAPSULE | Freq: Once | ORAL | Status: AC
Start: 2023-11-18 — End: 2023-11-18
  Administered 2023-11-18: 100 mg via ORAL
  Filled 2023-11-18: qty 1

## 2023-11-18 MED ORDER — ALBUTEROL SULFATE HFA 108 (90 BASE) MCG/ACT IN AERS: 1.0000 | INHALATION_SPRAY | Freq: Four times a day (QID) | RESPIRATORY_TRACT | 0 refills | Status: DC | PRN

## 2023-11-18 MED ORDER — BENZONATATE 100 MG PO CAPS: 100.0000 mg | ORAL_CAPSULE | Freq: Three times a day (TID) | ORAL | 0 refills | Status: DC

## 2023-11-18 MED ORDER — IPRATROPIUM-ALBUTEROL 0.5-2.5 (3) MG/3ML IN SOLN
3.0000 mL | Freq: Once | RESPIRATORY_TRACT | Status: AC
  Administered 2023-11-18: 3 mL via RESPIRATORY_TRACT
  Filled 2023-11-18: qty 3

## 2023-11-18 MED ORDER — PAXLOVID (150/100) 10 X 150 MG & 10 X 100MG PO TBPK: 2.0000 | ORAL_TABLET | Freq: Two times a day (BID) | ORAL | 0 refills | Status: AC

## 2023-11-18 NOTE — Discharge Instructions (Signed)
Your workup today was reassuring.  Please take the Tessalon as needed for cough.  Use the albuterol every 4 hours as needed for cough or shortness of breath.  You may start the Paxlovid which has been shown to decrease the severity of symptoms with COVID-19.  Please discuss this with the pharmacist if you have any further questions.  Return to the ER for worsening symptoms.

## 2023-11-18 NOTE — ED Provider Notes (Signed)
[place] EMERGENCY DEPARTMENT AT [facility] Provider Note   CSN: [id number] Arrival date & time: 11/18/23  4098     History  Chief Complaint  Patient presents with   Cough   Covid Positive    Regina Ramsey is a 82 y.o. female.  82 year old female with past medical history of hypertension and hyperlipidemia presenting to the emergency department today with cough.  The patient was diagnosed with COVID-19 over the weekend.  Her husband is also COVID-positive.  She was helping take care of her husband at home so she did not want to come to the ER when the urgent care they tested her over the weekend referred her here.  The patient states that she has been having a persistent cough but denies any significant shortness of breath.  She does report some chills with this.  She states that she has had some occasional palpitations over the weekend.  Denies any lightheadedness or shortness of breath.   Cough      Home Medications Prior to Admission medications   Medication Sig Start Date End Date Taking? Authorizing Provider  albuterol (VENTOLIN HFA) 108 (90 Base) MCG/ACT inhaler Inhale 1-2 puffs into the lungs every 6 (six) hours as needed for wheezing or shortness of breath. 11/18/23  Yes Durwin Glaze, MD  benzonatate (TESSALON) 100 MG capsule Take 1 capsule (100 mg total) by mouth every 8 (eight) hours. 11/18/23  Yes Durwin Glaze, MD  nirmatrelvir/ritonavir, renal dosing, (PAXLOVID, 150/100,) 10 x 150 MG & 10 x 100MG  TBPK Take 2 tablets by mouth 2 (two) times daily for 5 days. Dosage for moderate renal impairment (eGFR >/= 30 to <60 mL/min): 150 mg nirmatrelvir (one 150 mg tablet) with 100 mg ritonavir (one 100 mg tablet), with both tablets taken together twice daily for 5 days. Not recommended if eGFR < 30 mL/min.  PAXLOVID is not recommend in patients with severe hepatic impairment (Child-Pugh Class C). 11/18/23 11/23/23 Yes Durwin Glaze, MD  aspirin EC 81 MG tablet  Take 81 mg by mouth daily.    [provider]  atorvastatin (LIPITOR) 40 MG tablet Take 2 tablets (80 mg total) by mouth daily. TAKE 1 TABLET(40 MG) BY MOUTH DAILY AT 6 PM 09/24/22   Hilty, Lisette Abu, MD  lisinopril (ZESTRIL) 5 MG tablet TAKE 2 TABLETS(10 MG) BY MOUTH DAILY 09/03/23   Cannon Kettle, PA-C  metoprolol succinate (TOPROL-XL) 50 MG 24 hr tablet TAKE 1 TABLET(50 MG) BY MOUTH DAILY 04/29/23   Pincus Sanes, MD      Allergies    Short ragweed pollen ext    Review of Systems   Review of Systems  Respiratory:  Positive for cough.   Cardiovascular:  Positive for palpitations.  All other systems reviewed and are negative.   Physical Exam Updated Vital Signs BP (!) 127/59   Pulse 76   Temp 97.9 F (36.6 C)   Resp 16   SpO2 96%  Physical Exam Vitals and nursing note reviewed.   Gen: NAD Eyes: PERRL, EOMI HEENT: no oropharyngeal swelling Neck: trachea midline Resp: clear to auscultation bilaterally, bronchospastic cough noted Card: RRR, no murmurs, rubs, or gallops Abd: nontender, nondistended Extremities: no calf tenderness, no edema Vascular: 2+ radial pulses bilaterally, 2+ DP pulses bilaterally Skin: no rashes Psyc: acting appropriately   ED Results / Procedures / Treatments   Labs (all labs ordered are listed, but only abnormal results are displayed) Labs Reviewed  CBC WITH DIFFERENTIAL/PLATELET -  Abnormal; Notable for the following components:      Result Value   MCV 100.2 (*)    All other components within normal limits  BASIC METABOLIC PANEL - Abnormal; Notable for the following components:   BUN 28 (*)    Creatinine, Ser 1.05 (*)    GFR, Estimated 53 (*)    All other components within normal limits    EKG None  Radiology DG Chest Portable 1 View  Result Date: 11/18/2023 CLINICAL DATA:  Cough. EXAM: PORTABLE CHEST 1 VIEW COMPARISON:  11/20/2019. FINDINGS: Bilateral lung fields are clear. Note is made of elevated right hemidiaphragm.  Bilateral costophrenic angles are clear. Normal cardio-mediastinal silhouette. No acute osseous abnormalities. The soft tissues are within normal limits. IMPRESSION: *No active disease. Electronically Signed   By: Jules Schick M.D.   On: 11/18/2023 12:07    Procedures Procedures    Medications Ordered in ED Medications  ipratropium-albuterol (DUONEB) 0.5-2.5 (3) MG/3ML nebulizer solution 3 mL (3 mLs Nebulization Given 11/18/23 1048)  benzonatate (TESSALON) capsule 100 mg (100 mg Oral Given 11/18/23 1040)    ED Course/ Medical Decision Making/ A&P                                 Medical Decision Making 82 year old female with recently diagnosed COVID-19 presents the emergency department today with palpitations and cough after being diagnosed with COVID-19 over the weekend.  The patient's pulse ox is within normal limits.  Her heart rate is within normal limits.  I suspect that her symptoms are due to COVID-19.  Will obtain basic labs to evaluate for electrolyte abnormalities.  Will obtain an EKG and keep the patient on the cardiac monitor to evaluate for arrhythmias given the palpitations.  The patient does have a bronchospastic cough here.  I will give her a DuoNeb here to see if this helps with her symptoms.  I will reevaluate for ultimate disposition.  Will give her Tessalon here as well.  The patient's labs are reassuring.  X-ray is unremarkable.  Her pulse ox remains in the high 90s.  She will be discharged with return precautions.  Amount and/or Complexity of Data Reviewed Labs: ordered. Radiology: ordered.  Risk Prescription drug management.           Final Clinical Impression(s) / ED Diagnoses Final diagnoses:  COVID-19    Rx / DC Orders ED Discharge Orders          Ordered    benzonatate (TESSALON) 100 MG capsule  Every 8 hours        11/18/23 1231    albuterol (VENTOLIN HFA) 108 (90 Base) MCG/ACT inhaler  Every 6 hours PRN        11/18/23 1231     nirmatrelvir/ritonavir, renal dosing, (PAXLOVID, 150/100,) 10 x 150 MG & 10 x 100MG  TBPK  2 times daily        11/18/23 1231              Durwin Glaze, MD 11/18/23 1232

## 2023-11-18 NOTE — ED Triage Notes (Signed)
EMS reports from home, Pt and husband Dx with Covid Pos Saturday. Pt only C/o cough and in no distress. Here with husband.  BP 149/71 HR 96 RR 16 Sp02 95 RA

## 2023-11-30 ENCOUNTER — Other Ambulatory Visit (HOSPITAL_BASED_OUTPATIENT_CLINIC_OR_DEPARTMENT_OTHER): Payer: Self-pay | Admitting: Internal Medicine

## 2023-12-03 ENCOUNTER — Other Ambulatory Visit: Payer: Self-pay

## 2023-12-03 MED ORDER — ATORVASTATIN CALCIUM 40 MG PO TABS: 80.0000 mg | ORAL_TABLET | Freq: Every day | ORAL | 0 refills | Status: DC

## 2023-12-23 ENCOUNTER — Other Ambulatory Visit (HOSPITAL_COMMUNITY): Payer: Self-pay

## 2023-12-23 MED ORDER — DEXAMETHASONE 0.5 MG/5ML PO SOLN: 0.5000 mg | Freq: Four times a day (QID) | ORAL | 0 refills | Status: DC

## 2023-12-23 MED FILL — Lisinopril Tab 5 MG: ORAL | 90 days supply | Qty: 180 | Fill #0 | Status: CN

## 2023-12-24 ENCOUNTER — Other Ambulatory Visit (HOSPITAL_COMMUNITY): Payer: Self-pay

## 2024-01-15 ENCOUNTER — Other Ambulatory Visit: Payer: Self-pay | Admitting: Internal Medicine

## 2024-01-15 ENCOUNTER — Other Ambulatory Visit (HOSPITAL_COMMUNITY): Payer: Self-pay

## 2024-01-15 MED FILL — Lisinopril Tab 5 MG: ORAL | 90 days supply | Qty: 180 | Fill #0 | Status: CN

## 2024-01-16 ENCOUNTER — Other Ambulatory Visit (HOSPITAL_COMMUNITY): Payer: Self-pay

## 2024-01-16 ENCOUNTER — Other Ambulatory Visit: Payer: Self-pay

## 2024-01-16 MED ORDER — ATORVASTATIN CALCIUM 40 MG PO TABS
80.0000 mg | ORAL_TABLET | Freq: Every day | ORAL | 0 refills | Status: DC
  Filled 2024-01-16 – 2024-03-20 (×4): qty 60, 30d supply, fill #0

## 2024-02-17 ENCOUNTER — Encounter: Payer: Self-pay | Admitting: Family Medicine

## 2024-02-17 ENCOUNTER — Encounter: Payer: Self-pay | Admitting: Pharmacist

## 2024-02-17 ENCOUNTER — Other Ambulatory Visit: Payer: Self-pay

## 2024-02-17 ENCOUNTER — Ambulatory Visit (INDEPENDENT_AMBULATORY_CARE_PROVIDER_SITE_OTHER): Payer: Medicare PPO

## 2024-02-17 ENCOUNTER — Ambulatory Visit: Payer: Medicare PPO | Admitting: Family Medicine

## 2024-02-17 ENCOUNTER — Other Ambulatory Visit (HOSPITAL_COMMUNITY): Payer: Self-pay

## 2024-02-17 VITALS — BP 122/76 | HR 82 | Temp 98.4°F | Ht <= 58 in | Wt 120.6 lb

## 2024-02-17 DIAGNOSIS — B9689 Other specified bacterial agents as the cause of diseases classified elsewhere: Secondary | ICD-10-CM

## 2024-02-17 DIAGNOSIS — R0602 Shortness of breath: Secondary | ICD-10-CM

## 2024-02-17 DIAGNOSIS — R051 Acute cough: Secondary | ICD-10-CM | POA: Diagnosis not present

## 2024-02-17 DIAGNOSIS — R0989 Other specified symptoms and signs involving the circulatory and respiratory systems: Secondary | ICD-10-CM | POA: Diagnosis not present

## 2024-02-17 DIAGNOSIS — J069 Acute upper respiratory infection, unspecified: Secondary | ICD-10-CM | POA: Diagnosis not present

## 2024-02-17 DIAGNOSIS — R059 Cough, unspecified: Secondary | ICD-10-CM | POA: Diagnosis not present

## 2024-02-17 DIAGNOSIS — Z87891 Personal history of nicotine dependence: Secondary | ICD-10-CM | POA: Diagnosis not present

## 2024-02-17 DIAGNOSIS — R062 Wheezing: Secondary | ICD-10-CM | POA: Diagnosis not present

## 2024-02-17 DIAGNOSIS — I700 Atherosclerosis of aorta: Secondary | ICD-10-CM | POA: Diagnosis not present

## 2024-02-17 LAB — POCT RESPIRATORY SYNCYTIAL VIRUS: RSV Rapid Ag: NEGATIVE

## 2024-02-17 LAB — POCT INFLUENZA A/B
Influenza A, POC: NEGATIVE
Influenza B, POC: NEGATIVE

## 2024-02-17 LAB — POC COVID19 BINAXNOW: SARS Coronavirus 2 Ag: NEGATIVE

## 2024-02-17 MED ORDER — AZITHROMYCIN 250 MG PO TABS
ORAL_TABLET | ORAL | 0 refills | Status: AC
  Filled 2024-02-17 (×2): qty 6, 5d supply, fill #0

## 2024-02-17 MED ORDER — IPRATROPIUM-ALBUTEROL 0.5-2.5 (3) MG/3ML IN SOLN
3.0000 mL | Freq: Once | RESPIRATORY_TRACT | Status: AC
  Administered 2024-02-17: 3 mL via RESPIRATORY_TRACT

## 2024-02-17 MED ORDER — ALBUTEROL SULFATE HFA 108 (90 BASE) MCG/ACT IN AERS
2.0000 | INHALATION_SPRAY | Freq: Four times a day (QID) | RESPIRATORY_TRACT | 0 refills | Status: DC | PRN
  Filled 2024-02-17: qty 6.7, 25d supply, fill #0
  Filled 2024-02-17: qty 6.7, 30d supply, fill #0

## 2024-02-17 MED ORDER — PREDNISONE 20 MG PO TABS
40.0000 mg | ORAL_TABLET | Freq: Every day | ORAL | 0 refills | Status: AC
  Filled 2024-02-17 (×2): qty 10, 5d supply, fill #0

## 2024-02-17 NOTE — Patient Instructions (Addendum)
COVID test negative  Flu test negative  RSV test negative  I have sent in prednisone for you to take 2 tablets once daily in the morning with breakfast for the next 5 days.   I have sent in azithromycin for you to take.  Take 2 tablets today, then 1 tablet daily for the next 4 days.  I have sent in an albuterol inhaler for you to use 2 puffs every 4 hours as needed for wheezing.  We are getting an xray today. We will be in contact with any abnormal results that require further attention.  If there is any pneumonia seen on your xray I will also send in Augmentin for you to take twice a day for 7 days.   Follow-up with me if symptoms are persisting over the next week.

## 2024-02-17 NOTE — Progress Notes (Signed)
Acute Office Visit  Subjective:     Patient ID: Regina Ramsey, female    DOB: 1941/04/24, 83 y.o.   MRN: [id number]  Chief Complaint  Patient presents with   Acute Visit    Ongoing for 2 weeks, productive cough, yellow mucus, wheezing, nasal congestion. Husband tested positive on 02/11.    HPI Patient is in today for evaluation of cough, wheezing, congestion, fatigue, for the last 2 weeks. Has not attempted to treat at home, was unsure of what to take. Has positive hx COVID. Reports that husband has been in and out of the hospital and was diagnosed with COVID on 02/11/24. Denies abdominal pain, nausea, vomiting, diarrhea, rash, fever, chills, other symptoms.  Medical hx as outlined below.  ROS Per HPI      Objective:    BP 122/76 (BP Location: Left Arm, Patient Position: Sitting)   Pulse 82   Temp 98.4 F (36.9 C) (Oral)   Ht 4\' 10"  (1.473 m)   Wt 120 lb 9.6 oz (54.7 kg)   SpO2 95%   BMI 25.21 kg/m    Physical Exam Vitals and nursing note reviewed.  Constitutional:      Appearance: She is ill-appearing.  HENT:     Head: Normocephalic and atraumatic.     Nose: No congestion.     Mouth/Throat:     Mouth: Mucous membranes are moist.     Pharynx: Oropharynx is clear. No oropharyngeal exudate or posterior oropharyngeal erythema.  Eyes:     Extraocular Movements: Extraocular movements intact.  Cardiovascular:     Rate and Rhythm: Normal rate and regular rhythm.  Pulmonary:     Effort: Pulmonary effort is normal. No respiratory distress.     Breath sounds: Wheezing and rhonchi present. No rales.  Musculoskeletal:     Cervical back: Normal range of motion and neck supple.  Lymphadenopathy:     Cervical: No cervical adenopathy.  Skin:    General: Skin is warm and dry.  Neurological:     General: No focal deficit present.     Mental Status: She is alert and oriented to person, place, and time.    Results for orders placed or performed in visit on  02/17/24  POC COVID-19 BinaxNow  Result Value Ref Range   SARS Coronavirus 2 Ag Negative Negative  POCT Influenza A/B  Result Value Ref Range   Influenza A, POC Negative Negative   Influenza B, POC Negative Negative  POCT respiratory syncytial virus  Result Value Ref Range   RSV Rapid Ag Negative     Lung exam prior to neb tx: diffuse wheezing, decreased lung sounds to bilateral bases, rhonchi to bilateral upper lobes, persistent cough O2 sat: 95% HR: 82  Lung sounds post neb tx: rhonchi to bilateral lower lobes, wheezing greatly improved, cough improved  O2 sat: 96% HR: 80  Patient tolerated procedure well, good results, decreased work of breathing     Assessment & Plan:  1. Acute cough (Primary)  - POC COVID-19 BinaxNow - POCT Influenza A/B - POCT respiratory syncytial virus - DG Chest 2 View; Future - predniSONE (DELTASONE) 20 MG tablet; Take 2 tablets (40 mg total) by mouth daily for 5 days.  Dispense: 10 tablet; Refill: 0  2. SOB (shortness of breath)  - DG Chest 2 View; Future - predniSONE (DELTASONE) 20 MG tablet; Take 2 tablets (40 mg total) by mouth daily for 5 days.  Dispense: 10 tablet; Refill: 0  3. Wheezing  -  ipratropium-albuterol (DUONEB) 0.5-2.5 (3) MG/3ML nebulizer solution 3 mL - PR PRESSURIZED/NONPRESSURIZED INHALATION TREATMENT - predniSONE (DELTASONE) 20 MG tablet; Take 2 tablets (40 mg total) by mouth daily for 5 days.  Dispense: 10 tablet; Refill: 0 - Ventolin inhaler 2 puffs every 4 hrs prn wheezing  4. Bacterial URI  - azithromycin (ZITHROMAX) 250 MG tablet; Take 2 tablets on day 1, then 1 tablet daily on days 2 through 5  Dispense: 6 tablet; Refill: 0   Meds ordered this encounter  Medications   ipratropium-albuterol (DUONEB) 0.5-2.5 (3) MG/3ML nebulizer solution 3 mL   azithromycin (ZITHROMAX) 250 MG tablet    Sig: Take 2 tablets on day 1, then 1 tablet daily on days 2 through 5    Dispense:  6 tablet    Refill:  0   predniSONE  (DELTASONE) 20 MG tablet    Sig: Take 2 tablets (40 mg total) by mouth daily for 5 days.    Dispense:  10 tablet    Refill:  0   albuterol (VENTOLIN HFA) 108 (90 Base) MCG/ACT inhaler    Sig: Inhale 2 puffs into the lungs every 6 (six) hours as needed for wheezing or shortness of breath.    Dispense:  17 each    Refill:  0    Return if symptoms worsen or fail to improve.  Moshe Cipro, FNP

## 2024-03-11 ENCOUNTER — Other Ambulatory Visit (HOSPITAL_COMMUNITY): Payer: Self-pay

## 2024-03-11 MED FILL — Lisinopril Tab 5 MG: ORAL | 90 days supply | Qty: 180 | Fill #0 | Status: AC

## 2024-03-12 ENCOUNTER — Other Ambulatory Visit (HOSPITAL_COMMUNITY): Payer: Self-pay

## 2024-03-13 ENCOUNTER — Other Ambulatory Visit: Payer: Self-pay

## 2024-03-13 DIAGNOSIS — M810 Age-related osteoporosis without current pathological fracture: Secondary | ICD-10-CM

## 2024-03-13 MED ORDER — DENOSUMAB 60 MG/ML ~~LOC~~ SOSY: 60.0000 mg | PREFILLED_SYRINGE | Freq: Once | SUBCUTANEOUS | Status: AC

## 2024-03-16 ENCOUNTER — Encounter: Payer: Self-pay | Admitting: Internal Medicine

## 2024-03-16 DIAGNOSIS — E559 Vitamin D deficiency, unspecified: Secondary | ICD-10-CM | POA: Insufficient documentation

## 2024-03-16 DIAGNOSIS — N183 Chronic kidney disease, stage 3 unspecified: Secondary | ICD-10-CM | POA: Insufficient documentation

## 2024-03-16 NOTE — Patient Instructions (Addendum)
      Blood work was ordered.       Medications changes include :   None    A referral was ordered and someone will call you to schedule an appointment.     Return in about 1 year (around 03/17/2025) for Physical Exam.

## 2024-03-16 NOTE — Progress Notes (Signed)
      Subjective:    Patient ID: Regina Ramsey, female    DOB: 1941/09/30, 83 y.o.   MRN: [id number]     HPI Regina Ramsey is here for follow up of her chronic medical problems.  Ckd 2-3a - new  Medications and allergies reviewed with patient and updated if appropriate.  Current Outpatient Medications on File Prior to Visit  Medication Sig Dispense Refill  . aspirin  EC 81 MG tablet Take 81 mg by mouth daily.    . lisinopril  (ZESTRIL ) 5 MG tablet Take 2 tablets (10 mg total) by mouth daily. 180 tablet 3  . metoprolol  succinate (TOPROL -XL) 50 MG 24 hr tablet Take 1 tablet (50 mg total) by mouth daily. 90 tablet 3   Current Facility-Administered Medications on File Prior to Visit  Medication Dose Route Frequency Provider Last Rate Last Admin  . denosumab  (PROLIA ) injection 60 mg  60 mg Subcutaneous Once Colene Dauphin, MD         Review of Systems     Objective:  There were no vitals filed for this visit. BP Readings from Last 3 Encounters:  03/23/24 130/72  02/17/24 122/76  11/18/23 (!) 127/59   Wt Readings from Last 3 Encounters:  03/23/24 126 lb (57.2 kg)  02/17/24 120 lb 9.6 oz (54.7 kg)  09/24/23 131 lb (59.4 kg)   There is no height or weight on file to calculate BMI.    Physical Exam     Lab Results  Component Value Date   WBC 6.8 11/18/2023   HGB 13.0 11/18/2023   HCT 40.8 11/18/2023   PLT 187 11/18/2023   GLUCOSE 98 11/18/2023   CHOL 132 04/29/2023   TRIG 119.0 04/29/2023   HDL 42.90 04/29/2023   LDLDIRECT 193.0 07/13/2010   LDLCALC 66 04/29/2023   ALT 17 04/29/2023   AST 20 04/29/2023   NA 138 11/18/2023   K 3.6 11/18/2023   CL 102 11/18/2023   CREATININE 1.05 (H) 11/18/2023   BUN 28 (H) 11/18/2023   CO2 25 11/18/2023   TSH 2.61 04/29/2023   HGBA1C 6.2 04/29/2023     Assessment & Plan:    See Problem List for Assessment and Plan of chronic medical problems.    This encounter was created in error - please disregard.

## 2024-03-17 ENCOUNTER — Encounter: Admitting: Internal Medicine

## 2024-03-17 DIAGNOSIS — R7303 Prediabetes: Secondary | ICD-10-CM

## 2024-03-17 DIAGNOSIS — E782 Mixed hyperlipidemia: Secondary | ICD-10-CM

## 2024-03-17 DIAGNOSIS — I10 Essential (primary) hypertension: Secondary | ICD-10-CM

## 2024-03-17 DIAGNOSIS — I2510 Atherosclerotic heart disease of native coronary artery without angina pectoris: Secondary | ICD-10-CM

## 2024-03-17 DIAGNOSIS — N1831 Chronic kidney disease, stage 3a: Secondary | ICD-10-CM

## 2024-03-17 DIAGNOSIS — E538 Deficiency of other specified B group vitamins: Secondary | ICD-10-CM

## 2024-03-17 DIAGNOSIS — M810 Age-related osteoporosis without current pathological fracture: Secondary | ICD-10-CM

## 2024-03-17 DIAGNOSIS — E559 Vitamin D deficiency, unspecified: Secondary | ICD-10-CM

## 2024-03-17 NOTE — Assessment & Plan Note (Signed)
Chronic Regular exercise and healthy diet encouraged Check lipid panel  Continue atorvastatin 80 mg daily 

## 2024-03-17 NOTE — Assessment & Plan Note (Signed)
Chronic No chest pain, palpitations or shortness of breath Following with cardiology Continue aspirin 81 mg, atorvastatin 80 mg daily, metoprolol XL 50 mg daily and lisinopril 10 mg daily

## 2024-03-17 NOTE — Assessment & Plan Note (Signed)
 Chronic Blood pressure well controlled CMP, CBC Continue lisinopril 10 mg daily, metoprolol XL 50 mg daily

## 2024-03-17 NOTE — Assessment & Plan Note (Signed)
 Chronic DEXA last done 04/2022-reviewed that with her today Continue Prolia every 6 months-last Prolia was 03/13/2024 Continue Prolia Stressed calcium and vitamin D supplementation Check vitamin D level Stressed regular exercise

## 2024-03-17 NOTE — Assessment & Plan Note (Signed)
 Chronic Taking vitamin D daily Check vitamin D level

## 2024-03-17 NOTE — Assessment & Plan Note (Signed)
 Chronic Lab Results  Component Value Date   HGBA1C 6.2 04/29/2023   Check a1c Low sugar / carb diet Stressed regular exercise

## 2024-03-17 NOTE — Assessment & Plan Note (Signed)
Chronic CMP, CBC, vitamin D level 

## 2024-03-17 NOTE — Assessment & Plan Note (Signed)
Chronic Not currently taking any B12 Check level

## 2024-03-20 ENCOUNTER — Other Ambulatory Visit (HOSPITAL_COMMUNITY): Payer: Self-pay

## 2024-03-20 ENCOUNTER — Other Ambulatory Visit (HOSPITAL_BASED_OUTPATIENT_CLINIC_OR_DEPARTMENT_OTHER): Payer: Self-pay

## 2024-03-23 ENCOUNTER — Encounter: Payer: Self-pay | Admitting: Family Medicine

## 2024-03-23 ENCOUNTER — Other Ambulatory Visit (HOSPITAL_COMMUNITY): Payer: Self-pay

## 2024-03-23 ENCOUNTER — Ambulatory Visit: Admitting: Family Medicine

## 2024-03-23 ENCOUNTER — Ambulatory Visit: Payer: Self-pay

## 2024-03-23 VITALS — BP 130/72 | HR 74 | Temp 98.0°F | Resp 18 | Ht <= 58 in | Wt 126.0 lb

## 2024-03-23 DIAGNOSIS — T7840XA Allergy, unspecified, initial encounter: Secondary | ICD-10-CM | POA: Diagnosis not present

## 2024-03-23 MED ORDER — METHYLPREDNISOLONE ACETATE 80 MG/ML IJ SUSP
80.0000 mg | Freq: Once | INTRAMUSCULAR | Status: AC
  Administered 2024-03-23: 80 mg via INTRAMUSCULAR

## 2024-03-23 MED ORDER — MUPIROCIN 2 % EX OINT
TOPICAL_OINTMENT | CUTANEOUS | 3 refills | Status: DC
  Filled 2024-03-23: qty 22, 7d supply, fill #0

## 2024-03-23 MED ORDER — PREDNISONE 10 MG (21) PO TBPK
ORAL_TABLET | ORAL | 0 refills | Status: DC
  Filled 2024-03-23: qty 21, 6d supply, fill #0

## 2024-03-23 NOTE — Progress Notes (Signed)
 Assessment & Plan:  1. Allergic reaction, initial encounter (Primary) Education provided on hives.  Depo-Medrol given in office today; encouraged to start prednisone tomorrow.  Bactroban for open areas.  Encouraged to switch to products for sensitive skin to try to identify which is causing her reaction. - methylPREDNISolone acetate (DEPO-MEDROL) injection 80 mg - predniSONE (STERAPRED UNI-PAK 21 TAB) 10 MG (21) TBPK tablet; Use as directed starting tomorrow.  Dispense: 21 each; Refill: 0 - mupirocin ointment (BACTROBAN) 2 %; Apply to affected area TID for 7 days.  Dispense: 30 g; Refill: 3   Follow up plan: Return if symptoms worsen or fail to improve.  Deliah Boston, MSN, APRN, FNP-C  Subjective:  HPI: Regina Ramsey is a 83 y.o. female presenting on 03/23/2024 for Rash (Left upper body, face and neck - first noticed on Tuesday afternoon /Last weekend was working in the yard with planting and cutting trees /No new soaps/detergents, etc.)  Patient reports an itchy rash to her face, neck, and left side of her body that appeared six days ago.  She was working out in her yard planting and cutting trees last weekend, but does not feel she was exposed to anything new as she has been living at her current location for three years.  She sprayed the left side of her face with hairspray on Tuesday and thought it may have caused the rash initially as it did start on the left side of her face, but then it kept spreading.  She ate a new preordered and a prepared meal from her insurance company on Tuesday and then again yesterday, but the rash persistently worsened after Tuesday.  Denies any new shampoo, conditioner, body soap, laundry detergent, fabric softener, etc.  She took liquid Claritin 10 mg last night for the itching which she reports worked wonders.  However this morning when she woke up the rash had continued to spread and she had swelling on the left side of her face.  Denies any difficulty  breathing.  Denies getting any ticks off of her.    ROS: Negative unless specifically indicated above in HPI.   Relevant past medical history reviewed and updated as indicated.   Allergies and medications reviewed and updated.   Current Outpatient Medications:    albuterol (VENTOLIN HFA) 108 (90 Base) MCG/ACT inhaler, Inhale into the lungs every 6 (six) hours as needed for wheezing or shortness of breath., Disp: , Rfl:    aspirin EC 81 MG tablet, Take 81 mg by mouth daily., Disp: , Rfl:    atorvastatin (LIPITOR) 40 MG tablet, Take 2 tablets (80 mg total) by mouth daily., Disp: 60 tablet, Rfl: 0   lisinopril (ZESTRIL) 5 MG tablet, Take 2 tablets (10 mg total) by mouth daily., Disp: 180 tablet, Rfl: 3   metoprolol succinate (TOPROL-XL) 50 MG 24 hr tablet, Take 1 tablet (50 mg total) by mouth daily., Disp: 90 tablet, Rfl: 3  Current Facility-Administered Medications:    [START ON 03/27/2024] denosumab (PROLIA) injection 60 mg, 60 mg, Subcutaneous, Once, Burns, Bobette Mo, MD  Allergies  Allergen Reactions   Short Ragweed Pollen Ext     Objective:   BP 130/72   Pulse 74   Temp 98 F (36.7 C)   Resp 18   Ht 4\' 10"  (1.473 m)   Wt 126 lb (57.2 kg)   SpO2 96%   BMI 26.33 kg/m    Physical Exam Vitals reviewed.  Constitutional:      General: She is  not in acute distress.    Appearance: Normal appearance. She is not ill-appearing, toxic-appearing or diaphoretic.  HENT:     Head: Normocephalic and atraumatic.  Eyes:     General: No scleral icterus.       Right eye: No discharge.        Left eye: No discharge.     Conjunctiva/sclera: Conjunctivae normal.  Cardiovascular:     Rate and Rhythm: Normal rate.  Pulmonary:     Effort: Pulmonary effort is normal. No respiratory distress.  Musculoskeletal:        General: Normal range of motion.     Cervical back: Normal range of motion.  Skin:    General: Skin is warm and dry.     Capillary Refill: Capillary refill takes less than  2 seconds.     Findings: Rash present. Rash is urticarial (left side of face, around entire neck, left side of body around the abdomen.).     Comments: A few very small scabs on left cheek from scratching.  Neurological:     General: No focal deficit present.     Mental Status: She is alert and oriented to person, place, and time. Mental status is at baseline.  Psychiatric:        Mood and Affect: Mood normal.        Behavior: Behavior normal.        Thought Content: Thought content normal.        Judgment: Judgment normal.

## 2024-03-23 NOTE — Telephone Encounter (Signed)
  Chief Complaint: Rash - pt thinks allergic reaction Symptoms: red raised bumps on face neck hairline, some facial swelling Frequency: since using expired hairspray on 03/17/2024 Pertinent Negatives: Patient denies difficulty breathing Disposition: [] ED /[] Urgent Care (no appt availability in office) / [x] Appointment(In office/virtual)/ []  [facility]/ [] Home Care/ [] Refused Recommended Disposition /[] [facility]/ []  Follow-up with PCP Additional Notes: Pt states that since using expired hairspray she started having red bumps on face neck and back. Pt accidentally sprayed her face instead of her hair. Pt's son gave her liquid Claritin which was very effective in resolving rash, but rash returned. Appt for this morning.    Copied from CRM [id number]. Topic: Clinical - Red Word Triage >> Mar 23, 2024  8:56 AM Mackie Pai E wrote: [facility] that prompted transfer to Nurse Triage: Rash (little bumps) all over body. Patient stated that it has spread to her face, and her face has become swollen. The bumps are itchy and painful and spreading throughout her body. Rash started last Tuesday 3/18 and has progressively gotten worse. Reason for Disposition  SEVERE itching (i.e., interferes with sleep, normal activities or school)  Answer Assessment - Initial Assessment Questions 1. APPEARANCE of RASH: "Describe the rash." (e.g., spots, blisters, raised areas, skin peeling, scaly)     Red bumps 2. SIZE: "How big are the spots?" (e.g., tip of pen, eraser, coin; inches, centimeters)     Very small 3. LOCATION: "Where is the rash located?"     Face, neck back and hair line 4. COLOR: "What color is the rash?" (Note: It is difficult to assess rash color in people with darker-colored skin. When this situation occurs, simply ask the caller to describe what they see.)     red 5. ONSET: "When did the rash begin?"     03/17/2024 6. FEVER: "Do you have a fever?" If Yes, ask: "What is your  temperature, how was it measured, and when did it start?"     no 7. ITCHING: "Does the rash itch?" If Yes, ask: "How bad is the itch?" (Scale 1-10; or mild, moderate, severe)     yes 8. CAUSE: "What do you think is causing the rash?"     Expired hair spray - allergic reaction  Protocols used: Rash or Redness - [facility]

## 2024-03-25 ENCOUNTER — Telehealth: Payer: Self-pay | Admitting: Internal Medicine

## 2024-03-25 NOTE — Telephone Encounter (Signed)
 Patient wants a provider switch from Dr. Rennis Golden to Dr. Izora Ribas.  Please confirm.

## 2024-03-27 ENCOUNTER — Telehealth: Payer: Self-pay | Admitting: Internal Medicine

## 2024-03-27 ENCOUNTER — Ambulatory Visit: Attending: Internal Medicine | Admitting: Internal Medicine

## 2024-03-27 NOTE — Progress Notes (Deleted)
 OFFICE NOTE  Chief Complaint:  Follow-up  Primary Care Physician: Pincus Sanes, MD  HPI:  Regina Ramsey is a 83 y.o. female with a past medial history significant for Regina Ramsey returns today for follow-up.  She was seen by video visit.  She has no new complaints.  In November we performed cardiac CT for symptoms concerning for angina.  She was noted to have a high coronary artery calcium score of 500 with multivessel calcification.  The study was then sent for CT FFR to rule out high-grade proximal disease particularly in the LAD.  The FFR was not significant other than the distal LAD however a definitive left heart catheterization was performed which actually corroborated those findings.  There was no significant obstructive disease, small intramyocardial segment of the LAD was noted, but otherwise mild nonobstructive coronary disease with calcification.  Aggressive medical therapy was recommended.  Her blood pressure had remained elevated but after working with the hypertension clinic now is better controlled.  She also had a lipid profile in January with total cholesterol 145, triglycerides 104, HDL 41 and LDL of 83.  At the time her a atorvastatin was then increased to 40 mg daily to target LDL less than 70.  She is also been working on weight loss and more regular exercise.   03/22/20   Regina Ramsey is without complaints today.  She denies any chest pain or worsening shortness of breath.  She had a fall recently and actually is on her way to [facility] today for an appointment.  She was not able to provide her blood pressure.  She says she has had some erratic blood pressure readings at home.  Her last recorded blood pressure was 143/92.  I advised her to bring her blood pressure cuff in if she has some concerns to validate it.  She has had both Covid vaccine shots.  Cholesterol has been well controlled with labs about 8 months ago showed an LDL of 59.  07/25/2022  Regina Ramsey  returns today for follow-up.  I last saw her via video visit in 2021.  She says she has been dealing with her husband's health issues and is neglected follow-up.  She was not able to get an appointment and ran out of her medications.  Recently her PCP had given her a short course of medications but no refills.  She is now off of all of her medications again.  She understands the importance of compliance with this.  She has not had any lab work particular repeat lipid profile since 2020.  PMHx:  Past Medical History:  Diagnosis Date   ALLERGIC RHINITIS    ANEMIA-NOS    Anxiety    Asthma    Triggered by allergies   COPD    normal PFTs 06/22/11   Diverticulosis of colon    Dyspareunia    Endometriosis    GERD    HEARING LOSS 07/13/2010   HYPERLIPIDEMIA    HYPERTENSION    Irritable bowel syndrome    MIGRAINE HEADACHE    OSTEOPOROSIS    on Prolia q 71mo, hx vertebral fx   PALPITATIONS, HX OF    Vaginal atrophy     Past Surgical History:  Procedure Laterality Date   ABDOMINAL HYSTERECTOMY  12/31/1972   APPENDECTOMY  12/31/1956   BREAST SURGERY  01/01/1984   breast biopsy   CATARACT EXTRACTION, BILATERAL     LEFT HEART CATH AND CORONARY ANGIOGRAPHY N/A 12/03/2018   Procedure: LEFT  HEART CATH AND CORONARY ANGIOGRAPHY;  Surgeon: Lennette Bihari, MD;  Location: [facility] INVASIVE CV LAB;  Service: Cardiovascular;  Laterality: N/A;    FAMHx:  Family History  Adopted: Yes  Problem Relation Age of Onset   Arthritis Mother    Breast cancer Mother    Cancer Mother        started on her spine met cancer   Bone cancer Mother    Arthritis Father    Osteoarthritis Sister    Cancer - Prostate Brother    Cancer Brother    Hyperlipidemia Maternal Grandmother    Other Son        tachycardia   Breast cancer Maternal Aunt    Diabetes Other        parent & grandparent   Colon cancer Neg Hx    Colon polyps Neg Hx     SOCHx:   reports that she quit smoking about 60 years ago. Her smoking use  included cigarettes. She has never used smokeless tobacco. She reports current alcohol use of about 2.0 standard drinks of alcohol per week. She reports that she does not use drugs.  ALLERGIES:  Allergies  Allergen Reactions   Short Ragweed Pollen Ext     ROS: Pertinent items noted in HPI and remainder of comprehensive ROS otherwise negative.  HOME MEDS: Current Outpatient Medications on File Prior to Visit  Medication Sig Dispense Refill   albuterol (VENTOLIN HFA) 108 (90 Base) MCG/ACT inhaler Inhale into the lungs every 6 (six) hours as needed for wheezing or shortness of breath.     aspirin EC 81 MG tablet Take 81 mg by mouth daily.     atorvastatin (LIPITOR) 40 MG tablet Take 2 tablets (80 mg total) by mouth daily. 60 tablet 0   lisinopril (ZESTRIL) 5 MG tablet Take 2 tablets (10 mg total) by mouth daily. 180 tablet 3   metoprolol succinate (TOPROL-XL) 50 MG 24 hr tablet Take 1 tablet (50 mg total) by mouth daily. 90 tablet 3   mupirocin ointment (BACTROBAN) 2 % Apply to affected area three times daily for 7 days. 30 g 3   predniSONE (STERAPRED UNI-PAK 21 TAB) 10 MG (21) TBPK tablet Take as directed starting 03/24/2024 21 each 0   Current Facility-Administered Medications on File Prior to Visit  Medication Dose Route Frequency Provider Last Rate Last Admin   denosumab (PROLIA) injection 60 mg  60 mg Subcutaneous Once Pincus Sanes, MD        LABS/IMAGING: No results found for this or any previous visit (from the past 48 hours). No results found.  LIPID PANEL:    Component Value Date/Time   CHOL 132 04/29/2023 1107   CHOL 171 01/02/2023 0945   TRIG 119.0 04/29/2023 1107   TRIG 117 08/03/2009 0000   HDL 42.90 04/29/2023 1107   HDL 53 01/02/2023 0945   CHOLHDL 3 04/29/2023 1107   VLDL 23.8 04/29/2023 1107   LDLCALC 66 04/29/2023 1107   LDLCALC 100 (H) 01/02/2023 0945   LDLDIRECT 193.0 07/13/2010 0000     WEIGHTS: Wt Readings from Last 3 Encounters:  03/23/24 57.2 kg   02/17/24 54.7 kg  09/24/23 59.4 kg    VITALS: There were no vitals taken for this visit.  EXAM: General appearance: alert and no distress Neck: no carotid bruit, no JVD, and thyroid not enlarged, symmetric, no tenderness/mass/nodules Lungs: clear to auscultation bilaterally Heart: regular rate and rhythm, S1, S2 normal, no murmur, click, rub or gallop Abdomen:  soft, non-tender; bowel sounds normal; no masses,  no organomegaly Extremities: extremities normal, atraumatic, no cyanosis or edema Pulses: 2+ and symmetric Skin: Skin color, texture, turgor normal. No rashes or lesions Neurologic: Grossly normal Psych: Pleasant  EKG: Normal sinus rhythm at 81, LVH by voltage, IVCD- personally reviewed  ASSESSMENT: Multivessel coronary artery disease with heavy calcification and CAC 500, nonobstructive coronary disease by cath (11/2018) with 20% mid LAD stenosis and 15% proximal to mid LAD stenosis and intramyocardial bridge of the mid LAD Essential hypertension Mixed dyslipidemia LBBB Palpitations  PLAN: 1.   Ms. Burress returns today for follow-up.  She has been noncompliant with the medications and that she had not return for follow-up.  Based on this she has been off of all of her meds.  We will need to restart those today including her blood pressure and cholesterol meds.  She has not had a lipid test since 2020.  We will order a repeat along with a comprehensive metabolic profile in about 3 months.  Plan follow-up with [place] at that time to reassess her blood pressure as well and go over her lab work.  Chrystie Nose, MD, [facility], FACP  [place]  [facility]  Medical Director of the [facility]  Cardiovascular Risk Reduction Clinic Diplomate of the American Board of Clinical Lipidology Attending Cardiologist  Direct Dial: [phone]  Fax: [phone]  Website:  [email or web address]   Duglas Heier A Vanessia Bokhari 03/27/2024, 12:35 PM

## 2024-03-27 NOTE — Telephone Encounter (Signed)
 Copied from CRM [id number]. Topic: Appointments - Appointment Scheduling >> Mar 27, 2024  8:06 AM Regina Ramsey wrote: Patient is calling to schedule her Prolia injection states she has the autorization that's good until December, please reach out, thanks.  Regina Ramsey [phone]

## 2024-03-30 ENCOUNTER — Encounter: Payer: Self-pay | Admitting: Internal Medicine

## 2024-03-30 ENCOUNTER — Telehealth: Payer: Self-pay

## 2024-03-30 ENCOUNTER — Other Ambulatory Visit (HOSPITAL_COMMUNITY): Payer: Self-pay

## 2024-03-30 NOTE — Telephone Encounter (Signed)
 Pt ready for scheduling for Prolia on or after : 03/30/24  Option# 1: Buy/Bill (Office supplied medication)  Out-of-pocket cost due at time of clinic visit: $35  Number of injection/visits approved: --  Primary: [employer] - Medicare Prolia co-insurance: $35 Admin fee co-insurance: 100%  Secondary: N/A Prolia co-insurance:  Admin fee co-insurance:   Medical Benefit Details: Date Benefits were checked: 03/30/24 Deductible: no/ Coinsurance: $35/ Admin Fee: 100%  Prior Auth: Approved PA# [id number] Expiration Date: 01/01/24 to 12/30/24  # of doses approved: ----------------------------------------------------------------------- Option# 2- Med Obtained from pharmacy:  Pharmacy benefit: Copay $50 (Paid to pharmacy) Admin Fee: $10 (Pay at clinic)  Prior Auth: Approved  PA# [id number] Expiration Date: 01/01/24 to 12/30/24  # of doses approved:   If patient wants fill through the pharmacy benefit please send prescription to:  [facility] , and include estimated need by date in rx notes. Pharmacy will ship medication directly to the office.  Patient not eligible for Prolia Copay Card. Copay Card can make patient's cost as little as $25. Link to apply: [email or web address]://[email or web address]  ** This summary of benefits is an estimation of the patient's out-of-pocket cost. Exact cost may very based on individual plan coverage.

## 2024-03-30 NOTE — Telephone Encounter (Signed)
 Marland Kitchen

## 2024-03-30 NOTE — Telephone Encounter (Signed)
 Prolia approved from 01/01/2024 - 12/30/2024.

## 2024-03-30 NOTE — Telephone Encounter (Signed)
 Looks like prolia was just ordered on 03/27/24 and no updated authorization has been put in to chart yet.

## 2024-03-30 NOTE — Telephone Encounter (Signed)
 Prolia VOB initiated via [email or web address]

## 2024-03-30 NOTE — Telephone Encounter (Signed)
 Attempted to reach patient to schedule but her VM has not been set up yet and I am unable to leave a message.   Mychart sent today.

## 2024-04-08 ENCOUNTER — Ambulatory Visit: Admitting: Physician Assistant

## 2024-04-15 ENCOUNTER — Other Ambulatory Visit (HOSPITAL_COMMUNITY): Payer: Self-pay

## 2024-04-15 ENCOUNTER — Other Ambulatory Visit: Payer: Self-pay

## 2024-04-15 MED ORDER — ATORVASTATIN CALCIUM 40 MG PO TABS
80.0000 mg | ORAL_TABLET | Freq: Every day | ORAL | 0 refills | Status: DC
  Filled 2024-04-15: qty 180, 90d supply, fill #0

## 2024-05-06 NOTE — Telephone Encounter (Signed)
 Patient is cleared for PROLIA  injection per PA information on 03/30/2024.  Mentioned in provider note last on 09/2023. Medication is CLINIC supplied. CoPay:$35

## 2024-05-13 ENCOUNTER — Ambulatory Visit (INDEPENDENT_AMBULATORY_CARE_PROVIDER_SITE_OTHER)

## 2024-05-13 DIAGNOSIS — M810 Age-related osteoporosis without current pathological fracture: Secondary | ICD-10-CM | POA: Diagnosis not present

## 2024-05-13 MED ORDER — DENOSUMAB 60 MG/ML ~~LOC~~ SOSY: 60.0000 mg | PREFILLED_SYRINGE | Freq: Once | SUBCUTANEOUS | Status: DC

## 2024-05-13 MED ORDER — DENOSUMAB 60 MG/ML ~~LOC~~ SOSY
60.0000 mg | PREFILLED_SYRINGE | Freq: Once | SUBCUTANEOUS | Status: AC
  Administered 2024-05-13: 60 mg via SUBCUTANEOUS

## 2024-05-13 MED ORDER — DENOSUMAB 60 MG/ML ~~LOC~~ SOSY: 60.0000 mg | PREFILLED_SYRINGE | Freq: Once | SUBCUTANEOUS | Status: AC

## 2024-05-13 NOTE — Progress Notes (Signed)
Pt was given Prolia injection with no complications.

## 2024-06-02 ENCOUNTER — Other Ambulatory Visit (HOSPITAL_COMMUNITY): Payer: Self-pay

## 2024-06-02 MED FILL — Lisinopril Tab 5 MG: ORAL | 90 days supply | Qty: 180 | Fill #1 | Status: AC

## 2024-06-10 ENCOUNTER — Other Ambulatory Visit: Payer: Self-pay | Admitting: Internal Medicine

## 2024-06-10 ENCOUNTER — Other Ambulatory Visit (HOSPITAL_COMMUNITY): Payer: Self-pay

## 2024-06-10 ENCOUNTER — Other Ambulatory Visit: Payer: Self-pay

## 2024-06-10 MED ORDER — METOPROLOL SUCCINATE ER 50 MG PO TB24
50.0000 mg | ORAL_TABLET | Freq: Every day | ORAL | 0 refills | Status: DC
  Filled 2024-06-10: qty 30, 30d supply, fill #0

## 2024-06-10 MED ORDER — ATORVASTATIN CALCIUM 40 MG PO TABS
80.0000 mg | ORAL_TABLET | Freq: Every day | ORAL | 0 refills | Status: DC
  Filled 2024-06-10 – 2024-06-22 (×3): qty 60, 30d supply, fill #0

## 2024-06-12 ENCOUNTER — Other Ambulatory Visit (HOSPITAL_COMMUNITY): Payer: Self-pay

## 2024-06-22 ENCOUNTER — Other Ambulatory Visit: Payer: Self-pay

## 2024-06-22 ENCOUNTER — Other Ambulatory Visit (HOSPITAL_COMMUNITY): Payer: Self-pay

## 2024-07-07 ENCOUNTER — Other Ambulatory Visit (HOSPITAL_COMMUNITY): Payer: Self-pay

## 2024-07-07 ENCOUNTER — Other Ambulatory Visit: Payer: Self-pay | Admitting: Internal Medicine

## 2024-07-16 ENCOUNTER — Other Ambulatory Visit (HOSPITAL_COMMUNITY): Payer: Self-pay

## 2024-07-16 ENCOUNTER — Other Ambulatory Visit: Payer: Self-pay | Admitting: Internal Medicine

## 2024-07-16 MED FILL — Metoprolol Succinate Tab ER 24HR 50 MG (Tartrate Equiv): ORAL | 30 days supply | Qty: 30 | Fill #0 | Status: AC

## 2024-07-22 ENCOUNTER — Other Ambulatory Visit (HOSPITAL_COMMUNITY): Payer: Self-pay

## 2024-07-22 ENCOUNTER — Ambulatory Visit: Attending: Internal Medicine | Admitting: Internal Medicine

## 2024-07-22 VITALS — BP 147/73 | HR 65 | Ht <= 58 in | Wt 129.0 lb

## 2024-07-22 DIAGNOSIS — R931 Abnormal findings on diagnostic imaging of heart and coronary circulation: Secondary | ICD-10-CM | POA: Diagnosis not present

## 2024-07-22 DIAGNOSIS — R011 Cardiac murmur, unspecified: Secondary | ICD-10-CM

## 2024-07-22 DIAGNOSIS — E782 Mixed hyperlipidemia: Secondary | ICD-10-CM | POA: Diagnosis not present

## 2024-07-22 DIAGNOSIS — I447 Left bundle-branch block, unspecified: Secondary | ICD-10-CM | POA: Diagnosis not present

## 2024-07-22 DIAGNOSIS — I10 Essential (primary) hypertension: Secondary | ICD-10-CM | POA: Diagnosis not present

## 2024-07-22 MED ORDER — LISINOPRIL 10 MG PO TABS
10.0000 mg | ORAL_TABLET | Freq: Every day | ORAL | 3 refills | Status: DC
Start: 2024-07-22 — End: 2024-11-12
  Filled 2024-07-22: qty 90, 90d supply, fill #0
  Filled 2024-10-02 – 2024-10-05 (×2): qty 90, 90d supply, fill #1

## 2024-07-22 MED ORDER — METOPROLOL SUCCINATE ER 50 MG PO TB24
50.0000 mg | ORAL_TABLET | Freq: Every day | ORAL | 3 refills | Status: DC
  Filled 2024-07-22 – 2024-08-17 (×2): qty 90, 90d supply, fill #0

## 2024-07-22 NOTE — Progress Notes (Signed)
 Cardiology Office Note:  .    Date:  07/22/2024  ID:  Regina Ramsey, DOB [birth year], MRN [id number] PCP: Regina Glade PARAS, Ramsey  [facility] Providers Cardiologist:  None     CC: Transition to Regina cardiologist  History of Present Illness: .    Regina Ramsey is a 83 y.o. female  with hypertension and coronary artery disease who presents to establish care. She was previously a patient of Dr. Mona and Dr. Cindie.  She has a history of hypertension, coronary artery disease without angina, and hyperlipidemia. Her blood pressure is usually in the 150s, and she has been off metoprolol  for a month due to difficulties in obtaining it. She is currently on lisinopril  5 mg twice daily and atorvastatin  80 mg daily.  She experiences shortness of breath when bending over to assist her husband, which resolves after about an hour. No shortness of breath during other activities, such as walking or climbing stairs, and she notes that she does not have to bend over during these activities. She describes herself as not very physically active, primarily due to her caregiving responsibilities for her husband.  Her medication regimen includes atorvastatin  80 mg daily for hyperlipidemia, lisinopril  5 mg twice daily for hypertension, and metoprolol  succinate for coronary artery disease. She also takes aspirin .  She notes some swelling in her legs, which she describes as a Regina symptom. She is scheduled to see a podiatrist for her foot issues. She has a history of a heart murmur, which she states she has had all her life.  Discussed the use of AI scribe software for clinical note transcription with the patient, who gave verbal consent to proceed.   Relevant histories: .  Social  -  Living Situation: Lives with husband, who uses a walker and is transitioning to a cane. ROS: As per HPI.   Studies Reviewed: .     Cardiac Studies & Procedures    ______________________________________________________________________________________________ CARDIAC CATHETERIZATION  CARDIAC CATHETERIZATION 12/03/2018  Conclusion  Mid LAD lesion is 20% stenosed.  Prox LAD to Mid LAD lesion is 15% stenosed.  The left ventricular systolic function is normal.  LV end diastolic pressure is normal.  There is evidence for coronary calcification involving all coronary arteries.  The dominant RCA and left circumflex vessels do not appear to have any intraluminal stenosis.  The LAD is calcification proximally.  The mid LAD dips intramyocardial and has probable luminal plaque of 10% with very mild systolic bridging.  There is focal 20% distal LAD stenosis.  Hyperdynamic LV function with evidence for concentric LVH, EF greater than 65%, and LVEDP at 13 mmHg.  RECOMMENDATION: Medical therapy.  Aggressive lipid-lowering therapy with target LDL less than 70.  Findings Coronary Findings Diagnostic  Dominance: Right  Left Anterior Descending Prox LAD to Mid LAD lesion is 15% stenosed. Mid LAD lesion is 20% stenosed.  Intervention  No interventions have been documented.     ECHOCARDIOGRAM  ECHOCARDIOGRAM COMPLETE 05/05/2018  Narrative *Regina Ramsey* [address] [place], [place] [address] [phone]  ------------------------------------------------------------------- Transthoracic Echocardiography  Patient:    Regina, Ramsey MR #:       [id number] Study Date: 05/05/2018 Gender:     F Age:        42 Height:     147.Ramsey cm Weight:     59.4 kg BSA:        1.58 m^2 Pt. Status: Room:  ATTENDING    Regina Ramsey, M.D. ORDERING  Regina Ramsey REFERRING    Regina Ramsey SONOGRAPHER  Regina Ramsey, Regina Ramsey PERFORMING   Regina Ramsey, Regina Ramsey  cc:  ------------------------------------------------------------------- LV EF: 55% -   60%  ------------------------------------------------------------------- Indications:      LBBB  (I44.7).  ------------------------------------------------------------------- History:   PMH:  LBBB, Hyperlipidemia.  Dyspnea.  Risk factors: Hypertension.  ------------------------------------------------------------------- Study Conclusions  - Left ventricle: Abnormal septal motion from LBBB. The cavity size was normal. Wall thickness was increased in a pattern of mild LVH. Systolic function was normal. The estimated ejection fraction was in the range of 55% to 60%. Wall motion was normal; there were no regional wall motion abnormalities. Doppler parameters are consistent with both elevated ventricular end-diastolic filling pressure and elevated left atrial filling pressure. - Atrial septum: No defect or patent foramen ovale was identified.  ------------------------------------------------------------------- Study data:  Comparison was made to the study of 06/08/2016.  Study status:  Routine.  Procedure:  The patient reported no pain pre or post test. Transthoracic echocardiography. Image quality was adequate.          Transthoracic echocardiography. M-mode, complete 2D,3D, spectral Doppler, and color Doppler.  Birthdate:  Patient birthdate: 1941/07/23.  Age:  Patient is 83 yr old.  Sex:  Gender: female.    BMI: 27.4 kg/m^2.  Blood pressure:     116/84  Patient status:  Regina Ramsey.  Study date:  Study date: 05/05/2018. Study time: 10:38 AM.  Location:  Regina Ramsey  -------------------------------------------------------------------  ------------------------------------------------------------------- Left ventricle:  Abnormal septal motion from LBBB. The cavity size was normal. Wall thickness was increased in a pattern of mild LVH. Systolic function was normal. The estimated ejection fraction was in the range of 55% to 60%. Wall motion was normal; there were no regional wall motion abnormalities. Doppler parameters are consistent with both elevated ventricular  end-diastolic filling pressure and elevated left atrial filling pressure.  ------------------------------------------------------------------- Aortic valve:   Structurally normal valve. Trileaflet; normal thickness leaflets. Cusp separation was normal. Mobility was not restricted.  Doppler:  Transvalvular velocity was within the normal range. There was no stenosis. There was no regurgitation.  ------------------------------------------------------------------- Aorta:  Aortic root: The aortic root was normal in size.  ------------------------------------------------------------------- Mitral valve:   Mildly thickened leaflets . Mobility was not restricted.  Doppler:  Transvalvular velocity was within the normal range. There was no evidence for stenosis. There was trivial regurgitation.    Peak gradient (D): 4 mm Hg.  ------------------------------------------------------------------- Left atrium:  The atrium was normal in size.  ------------------------------------------------------------------- Atrial septum:  No defect or patent foramen ovale was identified.  ------------------------------------------------------------------- Right ventricle:  The cavity size was normal. Wall thickness was normal. Systolic function was normal.  ------------------------------------------------------------------- Pulmonic valve:    Doppler:  Transvalvular velocity was within the normal range. There was no evidence for stenosis. There was trivial regurgitation.  ------------------------------------------------------------------- Tricuspid valve:   Structurally normal valve.    Doppler: Transvalvular velocity was within the normal range. There was mild regurgitation.  ------------------------------------------------------------------- Pulmonary artery:   The main pulmonary artery was normal-sized. Systolic pressure was within the normal  range.  ------------------------------------------------------------------- Right atrium:  The atrium was normal in size.  ------------------------------------------------------------------- Pericardium:  The pericardium was normal in appearance. There was no pericardial effusion.  ------------------------------------------------------------------- Systemic veins: Inferior vena cava: The vessel was normal in size. The respirophasic diameter changes were in the normal range (= 50%), consistent with normal central venous pressure. Diameter: 14 mm.  ------------------------------------------------------------------- Measurements  IVC  Value        Reference ID                                         14    mm     ----------  Left ventricle                             Value        Reference LV ID, ED, PLAX chordal            (L)     27    mm     43 - 52 LV ID, ES, PLAX chordal            (L)     19.6  mm     23 - 38 LV fx shortening, PLAX chordal     (L)     27    %      >=29 LV PW thickness, ED                        12.2  mm     ---------- IVS/LV PW ratio, ED                        0.97         <=1.Ramsey Stroke volume, 2D                          60    ml     ---------- Stroke volume/bsa, 2D                      38    ml/m^2 ---------- LV e&', lateral                             5.98  cm/s   ---------- LV E/e&', lateral                           15.74        ---------- LV e&', medial                              Ramsey.37  cm/s   ---------- LV E/e&', medial                            27.92        ---------- LV e&', average                             4.68  cm/s   ---------- LV E/e&', average                           20.13        ----------  Ventricular septum                         Value        Reference IVS thickness, ED  11.8  mm     ----------  LVOT                                       Value        Reference LVOT ID, S                                  17    mm     ---------- LVOT area                                  2.27  cm^2   ---------- LVOT peak velocity, S                      122   cm/s   ---------- LVOT mean velocity, S                      87.6  cm/s   ---------- LVOT VTI, S                                26.4  cm     ---------- LVOT peak gradient, S                      6     mm Hg  ----------  Aorta                                      Value        Reference Aortic root ID, ED                         27    mm     ----------  Left atrium                                Value        Reference LA ID, A-P, ES                             31    mm     ---------- LA ID/bsa, A-P                             1.96  cm/m^2 <=2.2 LA volume, S                               32.9  ml     ---------- LA volume/bsa, S                           20.8  ml/m^2 ---------- LA volume, ES, 1-p A4C                     31.2  ml     ---------- LA volume/bsa, ES, 1-p  A4C                 19.8  ml/m^2 ---------- LA volume, ES, 1-p A2C                     34.7  ml     ---------- LA volume/bsa, ES, 1-p A2C                 22    ml/m^2 ----------  Mitral valve                               Value        Reference Mitral E-wave peak velocity                94.1  cm/s   ---------- Mitral A-wave peak velocity                129   cm/s   ---------- Mitral deceleration time           (L)     148   ms     150 - 230 Mitral peak gradient, D                    4     mm Hg  ---------- Mitral E/A ratio, peak                     0.7          ----------  Pulmonary arteries                         Value        Reference PA pressure, S, DP                         28    mm Hg  <=30  Tricuspid valve                            Value        Reference Tricuspid regurg peak velocity             248   cm/s   ---------- Tricuspid peak RV-RA gradient              25    mm Hg  ---------- Tricuspid maximal regurg velocity,         248   cm/s    ---------- PISA  Right atrium                               Value        Reference RA ID, S-I, ES, A4C                        39.6  mm     34 - 49 RA area, ES, A4C                           8.68  cm^2   8.Ramsey - 19.5 RA volume, ES, A/L                         16.4  ml     ----------  RA volume/bsa, ES, A/L                     10.4  ml/m^2 ----------  Systemic veins                             Value        Reference Estimated CVP                              Ramsey     mm Hg  ----------  Right ventricle                            Value        Reference TAPSE                                      22.7  mm     ---------- RV pressure, S, DP                         28    mm Hg  <=30 RV s&', lateral, S                          12.1  cm/s   ----------  Legend: (L)  and  (H)  mark values outside specified reference range.  ------------------------------------------------------------------- Prepared and Electronically Authenticated by  Regina Ramsey, M.D. 2019-05-06T11:35:06      CT SCANS  CT CORONARY FRACTIONAL FLOW RESERVE DATA PREP 10/28/2018  Narrative EXAM: FFRCT ANALYSIS  FINDINGS: FFRct analysis was performed on the original cardiac CT angiogram dataset. Diagrammatic representation of the FFRct analysis is provided in a separate PDF document in PACS. This dictation was created using the PDF document and an interactive 3D model of the results. 3D model is not available in the EMR/PACS. Normal FFR range is >0.80.  1. Left Main: findings No obstructive disease, FFR 0.98.  2. LAD: findings Proximal LAD, FFR 0.96. Mid-distal FFR not interpretable. Proximal D1 FFR 0.9. Distal D1, FFR 0.80, significant.  Ramsey. LCX: findings No obstruction of the proximal LCX, FFR 0.98, mid LCX 0.96, distal LCx 0.84, Distal OM1 FFR 0.83, distal OM2 FFR 0.83, borderline significant.  4. RCA: findings No obstruction of the proximal, FFR 0.98, mid, FFR 0.94, or distal, FFR 0.88,  RCA.,  IMPRESSION: Proximal LAD is heavily calcified and cannot rule out significant obstruction, as mid-distal FFR is uninterpretable. Recommend cardiac catheterization to rule out obstructive disease.  FFR abnormal in distal D1.  Regina Scarce, Ramsey  Note: These examples are not recommendations of [employer] and only provided as examples of what other customers are doing.   Electronically Signed By: Regina Ramsey On: 10/31/2018 10:27   CT CORONARY MORPH W/CTA COR W/SCORE 10/28/2018  Addendum 10/28/2018  5:46 PM ADDENDUM REPORT: 10/28/2018 17:43  EXAM: Cardiac/Coronary  CT  TECHNIQUE: The patient was scanned on a [employer].  FINDINGS: A 120 kV prospective scan was triggered in the descending thoracic aorta at 111 HU's. Axial non-contrast Ramsey mm slices were carried out through the heart. The data set was analyzed on a dedicated work station and scored using the Agatson method. Gantry rotation speed was 250 msecs and  collimation was .6 mm. No beta blockade and 0.8 mg of sl NTG was given. The 3D data set was reconstructed in 5% intervals of the 67-82 % of the R-R cycle. Diastolic phases were analyzed on a dedicated work station using MPR, MIP and VRT modes. The patient received 80 cc of contrast.  Aorta: Normal size. Ascending aorta 2.8cm. Mild calcification of the aortic root. No dissection.  Aortic Valve:  Trileaflet.  Minimal calcification.  Coronary Arteries:  Normal coronary origin.  Right dominance.  RCA is a large dominant artery that gives rise to PDA and 2 PL branches. There is no mild (25-49%) mixed calcified and low attenuation plaque in the proximal and minimal (1-24%) mixed plaque in the mid RCA. There is an acute marginal branch with minimal calcified plaque.  Left main is a short artery that gives rise to LAD and LCX arteries. There is minimal (1-24%) calcified plaque in the ostial LM.  LAD is a large vessel that is heavily  calcified with associated blooming artifact in the proximal vessel. There appears to be minimal obstruction of the lumen. D1 is heavily calcified at the ostim but there appears to be minimal obstruction of the lumen.  LCX is a non-dominant artery that gives rise to a large, branching OM1. There is minimal (1-24%) plaque in the proximal LCX.  Other findings:  Normal pulmonary vein drainage into the left atrium.  Normal let atrial appendage without a thrombus.  Normal size of the pulmonary artery.  IMPRESSION: 1. Coronary calcium  score of 500. This was 84th percentile for age and sex matched control.  2. Normal coronary origin with right dominance.  Ramsey.  Minimal CAD in the LM,LCX, and RCA.  Mild CAD in the LAD.  4.  Will send study for FFRct.  5. Recommend aggressive risk factor modification and high potency statin.  Regina Scarce, Ramsey   Electronically Signed By: Regina Ramsey On: 10/28/2018 17:43  Narrative EXAM: OVER-READ INTERPRETATION  CT CHEST  The following report is an over-read performed by radiologist Dr. Franky Ramsey of [facility] on 10/28/2018. This over-read does not include interpretation of cardiac or coronary anatomy or pathology. The coronary CTA interpretation by the cardiologist is attached.  COMPARISON:  Chest CT 08/11/2018  FINDINGS: Vascular: Heart is normal size.  Visualized aorta is normal caliber.  Mediastinum/Nodes: No adenopathy in the lower mediastinum or hila. Moderate-sized hiatal hernia.  Lungs/Pleura: Dependent atelectasis. No confluent opacities or effusions.  Upper Abdomen: Imaging into the upper abdomen shows no acute findings.  Musculoskeletal: Chest wall soft tissues are unremarkable. No acute bony abnormality.  IMPRESSION: Moderate-sized hiatal hernia.  No acute extra cardiac abnormality.  Electronically Signed: By: Regina Ramsey M.D. On: 10/28/2018 13:17      ______________________________________________________________________________________________       Physical Exam:    VS:  BP (!) 147/73 (BP Location: Right Wrist)   Pulse 65   Ht 4' 10 (1.473 m)   Wt 129 lb (58.5 kg)   SpO2 96%   BMI 26.96 kg/m    Wt Readings from Last Ramsey Encounters:  07/22/24 129 lb (58.5 kg)  03/23/24 126 lb (57.2 kg)  02/17/24 120 lb 9.6 oz (54.7 kg)    Gen: no distress   Neck: No JVD Cardiac: No Rubs or Gallops, holosystolic Murmur, presence of an S3, regular bradycardia, +2 radial pulses Respiratory: Clear to auscultation bilaterally, normal effort, normal  respiratory rate GI: Soft, nontender, non-distended  MS: trace bilateral edema;  moves all  extremities Integument: Skin feels warm Neuro:  At time of evaluation, alert and oriented to person/place/time/situation  Psych: Normal affect, patient feels ok   ASSESSMENT AND PLAN: .    An EKG was ordered for CAD and shows no ST or T wave changes  Bendopnea Shortness of breath primarily occurs when bending over, lasting up to an hour. Differential includes cardiac causes versus deconditioning. - Order echocardiogram to assess cardiac function. - Consider diuretic therapy if cardiac etiology is confirmed.  Heart murmur Regina systolic heart murmur detected. Differential includes pressure-related changes or progression of existing valve disease. - Order echocardiogram to evaluate heart murmur.  Leg swelling Mild leg swelling noted. Differential includes fluid retention possibly related to cardiac function. - Check basic natriuretic peptide in two weeks to evaluate for heart failure. - Consider starting Lasix 20 mg PO daily if BNP is elevated and indicates fluid overload ([place] labs come [place])  Hypertension Hypertension with blood pressure readings around 150/53-70 mmHg. Current regimen of lisinopril  5 mg BID is suboptimal for control. No signs of orthostatic  hypotension. - Increase lisinopril  to 10 mg BID. - Monitor blood pressure daily. - Check basic metabolic panel in two weeks to assess kidney function  Coronary artery disease without angina Coronary artery disease managed with metoprolol  succinate and aspirin . No current angina symptoms. - Continue metoprolol  succinate. - Continue aspirin  therapy.  Hyperlipidemia Hyperlipidemia is well-controlled on atorvastatin  80 mg daily. LDL levels are within target range. - Continue atorvastatin  80 mg daily.  One year with APP or me  Stanly Leavens, Ramsey FASE Comprehensive Regina Ramsey Surge Cardiologist [facility]  [address] [place], [address] [place], [place] [address] [phone]  11:03 AM

## 2024-07-22 NOTE — Patient Instructions (Signed)
 Medication Instructions:  Your physician has recommended you make the following change in your medication:  INCREASE: lisinopril  to 10 mg by mouth twice daily  Please monitor your BP   *If you need a refill on your cardiac medications before your next appointment, please call your pharmacy*  Lab Work: IN 2 WEEKS at any [facility]: BMP, BNP  If you have labs (blood work) drawn today and your tests are completely normal, you will receive your results only by: [employer] (if you have MyChart) OR A paper copy in the mail If you have any lab test that is abnormal or we need to change your treatment, we will call you to review the results.  Testing/Procedures: Your physician has requested that you have an echocardiogram. Echocardiography is a painless test that uses sound waves to create images of your heart. It provides your doctor with information about the size and shape of your heart and how well your heart's chambers and valves are working. This procedure takes approximately one hour. There are no restrictions for this procedure. Please do NOT wear cologne, perfume, aftershave, or lotions (deodorant is allowed). Please arrive 15 minutes prior to your appointment time.  Please note: We ask at that you not bring children with you during ultrasound (echo/ vascular) testing. Due to room size and safety concerns, children are not allowed in the ultrasound rooms during exams. Our front office staff cannot provide observation of children in our lobby area while testing is being conducted. An adult accompanying a patient to their appointment will only be allowed in the ultrasound room at the discretion of the ultrasound technician under special circumstances. We apologize for any inconvenience.   Follow-Up: At [facility], you and your health needs are our priority.  As part of our continuing mission to provide you with exceptional heart care, our providers are all part of one team.  This  team includes your primary Cardiologist (physician) and Advanced Practice Providers or APPs (Physician Assistants and Nurse Practitioners) who all work together to provide you with the care you need, when you need it.  Your next appointment:   4 month(s)  Provider:   One of our Advanced Practice Providers (APPs): Morse Clause, PA-C  Lamarr Satterfield, NP Miriam Shams, NP  Olivia Pavy, PA-C Josefa Beauvais, NP  Leontine Salen, PA-C Orren Fabry, PA-C  [place] Jackee Alberts, NP  Damien Braver, NP Jon Hails, PA-C  Waddell Donath, PA-C    Dayna Dunn, PA-C  Glendia Ferrier, PA-C Lum Louis, NP Katlyn West, NP Callie Goodrich, PA-C  Evan Williams, PA-C Sheng Haley, PA-C  Xika Zhao, NP Kathleen Johnson, PA-C

## 2024-07-23 ENCOUNTER — Other Ambulatory Visit (HOSPITAL_COMMUNITY): Payer: Self-pay

## 2024-07-23 ENCOUNTER — Ambulatory Visit: Admitting: Podiatry

## 2024-07-23 ENCOUNTER — Encounter: Payer: Self-pay | Admitting: Podiatry

## 2024-07-23 ENCOUNTER — Other Ambulatory Visit: Payer: Self-pay | Admitting: Internal Medicine

## 2024-07-23 DIAGNOSIS — B351 Tinea unguium: Secondary | ICD-10-CM

## 2024-07-23 DIAGNOSIS — M79672 Pain in left foot: Secondary | ICD-10-CM

## 2024-07-23 DIAGNOSIS — M79671 Pain in right foot: Secondary | ICD-10-CM | POA: Diagnosis not present

## 2024-07-23 DIAGNOSIS — L600 Ingrowing nail: Secondary | ICD-10-CM

## 2024-07-23 NOTE — Progress Notes (Signed)
 Patient presents for evaluation and treatment of tenderness and some redness around nails feet.  Tenderness around toes with walking and wearing shoes.  Hallux nails are especially thick and get painful when wearing shoes and walking.  Physical exam:  General appearance: Alert, pleasant, and in no acute distress.  Vascular: Pedal pulses: DP 2/4 B/L, PT 2 to/4 B/L.  Mild edema lower legs bilaterally.  Capillary refill time immediate bilateral.  Neurological:  Light touch intact.  Achilles tendon reflex normal bilaterally.  Dermatologic:  Nails thickened, disfigured, discolored 1-5 BL with subungual debris.  Redness and hypertrophic nail folds along nail folds bilaterally but no signs of drainage or infection.  Hallux nails are extremely thickened and dystrophic with redness along the nail folds and extremely gryphotic and incurvated borders.  Hypertrophy of nail folds  Musculoskeletal:  There is distal hallux bilaterally.  Normal muscle strength lower extremity bilaterally.   Diagnosis: 1. Painful onychomycotic nails 1 through 5 bilaterally. 2. Pain toes 1 through 5 bilaterally. 3.  Ingrown nail hallux bilaterally  Plan: New patient office visit level 3 for evaluation and management.  Modifier 25. - Discussed with the the thickened and extremely dystrophic ingrown nails on the hallux bilaterally.  For right now would just recommend periodic debridement of the nails.  If they do get worse or start getting infected matrixectomy can be considered.  -Debrided onychomycotic nails 1 through 5 bilaterally.  Return 3 months RFC

## 2024-07-24 ENCOUNTER — Other Ambulatory Visit: Payer: Self-pay

## 2024-07-24 ENCOUNTER — Other Ambulatory Visit (HOSPITAL_COMMUNITY): Payer: Self-pay

## 2024-07-24 MED ORDER — ATORVASTATIN CALCIUM 40 MG PO TABS
80.0000 mg | ORAL_TABLET | Freq: Every day | ORAL | 6 refills | Status: DC
  Filled 2024-07-24: qty 60, 30d supply, fill #0
  Filled 2024-07-24 – 2024-08-28 (×2): qty 60, 30d supply, fill #1

## 2024-08-05 DIAGNOSIS — R011 Cardiac murmur, unspecified: Secondary | ICD-10-CM | POA: Diagnosis not present

## 2024-08-05 DIAGNOSIS — I10 Essential (primary) hypertension: Secondary | ICD-10-CM | POA: Diagnosis not present

## 2024-08-06 ENCOUNTER — Ambulatory Visit: Payer: Self-pay

## 2024-08-06 LAB — BASIC METABOLIC PANEL WITH GFR
BUN/Creatinine Ratio: 21 (ref 12–28)
BUN: 20 mg/dL (ref 8–27)
CO2: 21 mmol/L (ref 20–29)
Calcium: 9.3 mg/dL (ref 8.7–10.3)
Chloride: 106 mmol/L (ref 96–106)
Creatinine, Ser: 0.95 mg/dL (ref 0.57–1.00)
Glucose: 91 mg/dL (ref 70–99)
Potassium: 3.9 mmol/L (ref 3.5–5.2)
Sodium: 144 mmol/L (ref 134–144)
eGFR: 59 mL/min/1.73 — AB (ref >59)

## 2024-08-06 LAB — PRO B NATRIURETIC PEPTIDE: NT-Pro BNP: 76 pg/mL (ref 0–738)

## 2024-08-17 ENCOUNTER — Other Ambulatory Visit: Payer: Self-pay

## 2024-08-17 ENCOUNTER — Other Ambulatory Visit (HOSPITAL_COMMUNITY): Payer: Self-pay

## 2024-08-19 ENCOUNTER — Other Ambulatory Visit (HOSPITAL_COMMUNITY): Payer: Self-pay

## 2024-08-26 ENCOUNTER — Ambulatory Visit (HOSPITAL_COMMUNITY)
Admission: RE | Admit: 2024-08-26 | Discharge: 2024-08-26 | Disposition: A | Discharge status: Home / Self Care | Source: Ambulatory Visit | Attending: Cardiology | Admitting: Cardiology

## 2024-08-26 DIAGNOSIS — R011 Cardiac murmur, unspecified: Secondary | ICD-10-CM | POA: Insufficient documentation

## 2024-08-26 LAB — ECHOCARDIOGRAM COMPLETE
Area-P 1/2: 3.28 cm2
S' Lateral: 2.2 cm

## 2024-08-28 ENCOUNTER — Other Ambulatory Visit (HOSPITAL_COMMUNITY): Payer: Self-pay

## 2024-09-08 ENCOUNTER — Ambulatory Visit

## 2024-09-08 VITALS — BP 108/60 | HR 86 | Ht 63.0 in | Wt 125.8 lb

## 2024-09-08 DIAGNOSIS — Z0000 Encounter for general adult medical examination without abnormal findings: Secondary | ICD-10-CM | POA: Diagnosis not present

## 2024-09-08 DIAGNOSIS — Z23 Encounter for immunization: Secondary | ICD-10-CM

## 2024-09-08 DIAGNOSIS — M810 Age-related osteoporosis without current pathological fracture: Secondary | ICD-10-CM

## 2024-09-08 NOTE — Patient Instructions (Addendum)
 Regina Ramsey,  Thank you for taking the time for your Medicare Wellness Visit. I appreciate your continued commitment to your health goals. Please review the care plan we discussed, and feel free to reach out if I can assist you further.  Medicare recommends these wellness visits once per year to help you and your care team stay ahead of potential health issues. These visits are designed to focus on prevention, allowing your provider to concentrate on managing your acute and chronic conditions during your regular appointments.  Please note that Annual Wellness Visits do not include a physical exam. Some assessments may be limited, especially if the visit was conducted virtually. If needed, we may recommend a separate in-person follow-up with your provider.  Ongoing Care Seeing your primary care provider every 3 to 6 months helps us  monitor your health and provide consistent, personalized care.   Referrals If a referral was made during today's visit and you haven't received any updates within two weeks, please contact the referred provider directly to check on the status.  Recommended Screenings:  Health Maintenance  Topic Date Due   Zoster (Shingles) Vaccine (1 of 2) 03/30/1991   DEXA scan (bone density measurement)  05/08/2024   COVID-19 Vaccine (5 - 2025-26 season) 08/31/2024   DTaP/Tdap/Td vaccine (3 - Td or Tdap) 07/18/2025   Medicare Annual Wellness Visit  09/08/2025   Pneumococcal Vaccine for age over 48  Completed   Flu Shot  Completed   HPV Vaccine  Aged Out   Meningitis B Vaccine  Aged Out   Colon Cancer Screening  Discontinued       09/08/2024   10:41 AM  Advanced Directives  Does Patient Have a Medical Advance Directive? Yes  Type of Estate agent of [place];Living will  Copy of Healthcare Power of Attorney in Chart? No - copy requested   Advance Care Planning is important because it: Ensures you receive medical care that aligns with your values,  goals, and preferences. Provides guidance to your family and loved ones, reducing the emotional burden of decision-making during critical moments.  Vision: Annual vision screenings are recommended for early detection of glaucoma, cataracts, and diabetic retinopathy. These exams can also reveal signs of chronic conditions such as diabetes and high blood pressure.  Dental: Annual dental screenings help detect early signs of oral cancer, gum disease, and other conditions linked to overall health, including heart disease and diabetes.

## 2024-09-08 NOTE — Progress Notes (Signed)
 Subjective:   Regina Ramsey is a 83 y.o. who presents for a Medicare Wellness preventive visit.  As a reminder, Annual Wellness Visits don't include a physical exam, and some assessments may be limited, especially if this visit is performed virtually. We may recommend an in-person follow-up visit with your provider if needed.  Visit Complete: In person   Persons Participating in Visit: Patient.  AWV Questionnaire: No: Patient Medicare AWV questionnaire was not completed prior to this visit.  Cardiac Risk Factors include: advanced age (>73men, >21 women);dyslipidemia;hypertension     Objective:    Today's Vitals   09/08/24 1041  BP: 108/60  Pulse: 86  SpO2: 96%  Weight: 125 lb 12.8 oz (57.1 kg)  Height: 5' 3 (1.6 m)   Body mass index is 22.28 kg/m.     09/08/2024   10:41 AM 11/18/2023    9:30 AM 09/24/2023    2:37 PM 07/19/2022    2:29 PM 03/28/2022    8:50 AM 11/16/2019    1:32 PM 10/18/2016   11:46 AM  Advanced Directives  Does Patient Have a Medical Advance Directive? Yes No Yes Yes Yes Yes Yes   Type of Estate agent of [place];Living will  Healthcare Power of [place];Living will Living will;Healthcare Power of Attorney Living will Living will;Healthcare Power of [employer] Power of [place];Living will   Does patient want to make changes to medical advance directive?    No - Patient declined   No - Patient declined   Copy of Healthcare Power of Attorney in Chart? No - copy requested  No - copy requested No - copy requested  No - copy requested, Physician notified No - copy requested   Would patient like information on creating a medical advance directive?  No - Patient declined          Data saved with a previous flowsheet row definition    Current Medications (verified) Outpatient Encounter Medications as of 09/08/2024  Medication Sig   albuterol  (VENTOLIN  HFA) 108 (90 Base) MCG/ACT inhaler Inhale into the lungs every 6 (six)  hours as needed for wheezing or shortness of breath.   aspirin  EC 81 MG tablet Take 81 mg by mouth daily.   atorvastatin  (LIPITOR) 40 MG tablet Take 2 tablets (80 mg total) by mouth daily.   lisinopril  (ZESTRIL ) 10 MG tablet Take 1 tablet (10 mg total) by mouth daily.   metoprolol  succinate (TOPROL -XL) 50 MG 24 hr tablet Take 1 tablet (50 mg total) by mouth daily.   mupirocin  ointment (BACTROBAN ) 2 % Apply to affected area three times daily for 7 days. (Patient not taking: Reported on 09/08/2024)   predniSONE  (STERAPRED UNI-PAK 21 TAB) 10 MG (21) TBPK tablet Take as directed starting 03/24/2024 (Patient not taking: Reported on 09/08/2024)   Facility-Administered Encounter Medications as of 09/08/2024  Medication   denosumab  (PROLIA ) injection 60 mg   [START ON 11/13/2024] denosumab  (PROLIA ) injection 60 mg    Allergies (verified) Short ragweed pollen ext   History: Past Medical History:  Diagnosis Date   ALLERGIC RHINITIS    ANEMIA-NOS    Anxiety    Asthma    Triggered by allergies   COPD    normal PFTs 06/22/11   Diverticulosis of colon    Dyspareunia    Endometriosis    GERD    HEARING LOSS 07/13/2010   HYPERLIPIDEMIA    HYPERTENSION    Irritable bowel syndrome    MIGRAINE HEADACHE    OSTEOPOROSIS  on Prolia  q 62mo, hx vertebral fx   PALPITATIONS, HX OF    Vaginal atrophy    Past Surgical History:  Procedure Laterality Date   ABDOMINAL HYSTERECTOMY  12/31/1972   APPENDECTOMY  12/31/1956   BREAST SURGERY  01/01/1984   breast biopsy   CATARACT EXTRACTION, BILATERAL     LEFT HEART CATH AND CORONARY ANGIOGRAPHY N/A 12/03/2018   Procedure: LEFT HEART CATH AND CORONARY ANGIOGRAPHY;  Surgeon: Burnard Debby LABOR, MD;  Location: [facility];  Service: Cardiovascular;  Laterality: N/A;   Family History  Adopted: Yes  Problem Relation Age of Onset   Arthritis Mother    Breast cancer Mother    Cancer Mother        started on her spine met cancer   Bone cancer Mother     Arthritis Father    Osteoarthritis Sister    Cancer - Prostate Brother    Cancer Brother    Hyperlipidemia Maternal Grandmother    Other Son        tachycardia   Breast cancer Maternal Aunt    Diabetes Other        parent & grandparent   Colon cancer Neg Hx    Colon polyps Neg Hx    Social History   Socioeconomic History   Marital status: Married    Spouse name: Nicholaus   Number of children: 2   Years of education: Not on file   Highest education level: Master's degree (e.g., MA, MS, MEng, MEd, MSW, MBA)  Occupational History   Occupation: Retired   Occupation: retired    Comment: Runner, broadcasting/film/video  Tobacco Use   Smoking status: Former    Current packs/day: 0.00    Types: Cigarettes    Quit date: 01/01/1964    Years since quitting: 60.7   Smokeless tobacco: Never   Tobacco comments:    Married, lives with souse. 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      No regular exercise, active      epworth sleepiness scale = 10 (05/30/16)   Social Drivers of Health   Financial Resource Strain: Low Risk  (09/08/2024)   Overall Financial Resource Strain (CARDIA)    Difficulty of Paying Living Expenses: Not hard at all  Food Insecurity: No Food Insecurity (09/08/2024)   Hunger Vital Sign    Worried About Running Out of Food in the Last Year: Never true    Ran Out of Food in the Last Year: Never true  Transportation Needs: No Transportation Needs (09/08/2024)   PRAPARE - Administrator, Civil Service (Medical): No    Lack of Transportation (Non-Medical): No  Physical Activity: Insufficiently  Active (09/08/2024)   Exercise Vital Sign    Days of Exercise per Week: 5 days    Minutes of Exercise per Session: 20 min  Stress: No Stress Concern Present (09/08/2024)   [employer] of Occupational Health - Occupational Stress Questionnaire    Feeling of Stress: Not at all  Social Connections: Socially Integrated (09/08/2024)  Social Advertising account executive    Frequency of Communication with Friends and Family: Twice a week    Frequency of Social Gatherings with Friends and Family: Once a week    Attends Religious Services: More than 4 times per year    Active Member of [employer] or Organizations: Yes    Attends Engineer, structural: More than 4 times per year    Marital Status: Married    Tobacco Counseling Counseling given: Not Answered Tobacco comments: Married, lives with souse. Master of education-specialist needs for grade school    Clinical Intake:  Pre-visit preparation completed: Yes  Pain : No/denies pain     BMI - recorded: 22.28 Nutritional Status: BMI of 19-24  Normal Nutritional Risks: None Diabetes: No  Lab Results  Component Value Date   HGBA1C 6.2 04/29/2023   HGBA1C 6.0 09/18/2021   HGBA1C 6.1 07/17/2019     How often do you need to have someone help you when you read instructions, pamphlets, or other written materials from your doctor or pharmacy?: 1 - Never  Interpreter Needed?: No  Information entered by :: Verdie Saba, CMA   Activities of Daily Living     09/08/2024   10:47 AM 09/24/2023    2:34 PM  In your present state of health, do you have any difficulty performing the following activities:  Hearing? 0 1  Comment  has hearing aides  Vision? 0 0  Difficulty concentrating or making decisions? 0 0  Walking or climbing stairs? 0 0  Dressing or bathing? 0 0  Doing errands, shopping? 0 0  Comment  sometimes uses Scientist, research (medical) and eating ? N N  Using the Toilet? N N  In the past six months, have you accidently  leaked urine? Y N  Comment wears a depend   Do you have problems with loss of bowel control? N N  Managing your Medications? N N  Managing your Finances? N N  Housekeeping or managing your Housekeeping? N N    Patient Care Team: Geofm Glade PARAS, MD as PCP - General (Internal Medicine) Lennard Lesta FALCON, MD (Gastroenterology) Livingston Rigg, MD (Dermatology) Mona Vinie BROCKS, MD as Consulting Physician (Cardiology) Debrah Lamar BIRCH, MD (Inactive) as Consulting Physician (Gastroenterology) Tory Kirsch, AUD (Audiology) Regenia Prentice Clack, MD as Consulting Physician (Ophthalmology) [facility] Santo Stanly LABOR, MD as Consulting Physician (Cardiology)  I have updated your Care Teams any recent Medical Services you may have received from other providers in the past year.     Assessment:   This is a routine wellness examination for Regina Ramsey.  Hearing/Vision screen Hearing Screening - Comments:: Denies hearing difficulties   Vision Screening - Comments:: Wears rx glasses - up to date with routine eye exams with [facility]   Goals Addressed               This Visit's Progress     Patient Stated (pt-stated)        Patient stated she plans to continue watching her diet and walking       Depression Screen     09/08/2024   10:51 AM 03/23/2024    9:55 AM 09/24/2023    2:45 PM 04/29/2023   10:21 AM 11/07/2022    1:29 PM 07/19/2022    2:32 PM 04/23/2022   10:22 AM  PHQ 2/9 Scores  PHQ - 2 Score 0 0 0 0 0 0 0  PHQ- 9 Score 1  1  0 2      Fall Risk     09/08/2024   10:49 AM 03/23/2024    9:54 AM 09/24/2023    2:38 PM 04/29/2023   10:21 AM 11/07/2022    1:29 PM  Fall Risk   Falls in the past year? 0 1 1 0 1  Number falls in past yr: 0 1 0 0 0  Injury with Fall? 0 0 0 0 1  Risk for fall due to : No Fall Risks History of fall(s) No Fall Risks No Fall Risks History of fall(s)  Follow up Falls evaluation completed;Falls prevention discussed Falls  evaluation completed Falls prevention discussed;Falls evaluation completed Falls evaluation completed Falls evaluation completed      Data saved with a previous flowsheet row definition    MEDICARE RISK AT HOME:  Medicare Risk at Home Any stairs in or around the home?: No If so, are there any without handrails?: No Home free of loose throw rugs in walkways, pet beds, electrical cords, etc?: Yes Adequate lighting in your home to reduce risk of falls?: Yes Life alert?: No Use of a cane, walker or w/c?: No Grab bars in the bathroom?: Yes Shower chair or bench in shower?: Yes Elevated toilet seat or a handicapped toilet?: Yes  TIMED UP AND GO:  Was the test performed?  No  Cognitive Function: 6CIT completed    10/18/2016   11:49 AM  MMSE - Mini Mental State Exam  Orientation to time 5   Orientation to Place 5   Registration 3   Attention/ Calculation 5   Recall 3   Language- name 2 objects 2   Language- repeat 1  Language- follow 3 step command 3   Language- read & follow direction 1   Write a sentence 1   Copy design 1   Total score 30      Data saved with a previous flowsheet row definition        09/08/2024   10:57 AM 09/24/2023    2:40 PM 07/19/2022    3:45 PM  6CIT Screen  What Year? 0 points 0 points 0 points  What month? 0 points 0 points 0 points  What time? 0 points 0 points 0 points  Count back from 20 0 points 0 points 0 points  Months in reverse 0 points 0 points 0 points  Repeat phrase 8 points 0 points 0 points  Total Score 8 points 0 points 0 points    Immunizations Immunization History  Administered Date(s) Administered   Fluad Quad(high Dose 65+) 09/11/2019, 09/12/2020, 09/18/2021, 09/26/2022   INFLUENZA, HIGH DOSE SEASONAL PF 09/17/2018, 09/08/2024   Influenza,inj,quad, With Preservative 10/01/2019   Influenza-Unspecified 10/16/2018   PFIZER(Purple Top)SARS-COV-2 Vaccination 02/13/2020, 03/06/2020, 10/18/2020   Pfizer Covid-19 Vaccine  Bivalent Booster 78yrs & up 10/01/2022   Pneumococcal Conjugate-13 10/11/2015   Pneumococcal Polysaccharide-23 02/07/2012   Td 06/30/2009   Tdap 07/19/2015   Zoster, Live 03/30/2014    Screening Tests Health Maintenance  Topic Date Due   Zoster Vaccines- Shingrix (1 of 2) 03/30/1991   DEXA SCAN  05/08/2024   COVID-19 Vaccine (5 - 2025-26 season) 08/31/2024   DTaP/Tdap/Td (3 - Td or Tdap) 07/18/2025   Medicare Annual Wellness (AWV)  09/08/2025   Pneumococcal Vaccine: 50+ Years  Completed   Influenza Vaccine  Completed   HPV VACCINES  Aged Out   Meningococcal B Vaccine  Aged Out   Colonoscopy  Discontinued    Health Maintenance Items Addressed:  Influenza vaccine given, DEXA ordered  Additional Screening:  Vision Screening: Recommended annual ophthalmology exams for early detection of glaucoma and other disorders of the eye. Is the patient up to date with their annual eye exam?  Yes  Who is the provider or what is the name of the office in which the patient attends annual eye exams? [facility]   Dental Screening: Recommended annual dental exams for proper oral hygiene  Community Resource Referral / Chronic Care Management: CRR required this visit?  No   CCM required this visit?  No   Plan:    I have personally reviewed and noted the following in the patient's chart:   Medical and social history Use of alcohol, tobacco or illicit drugs  Current medications and supplements including opioid prescriptions. Patient is not currently taking opioid prescriptions. Functional ability and status Nutritional status Physical activity Advanced directives List of other physicians Hospitalizations, surgeries, and ER visits in previous 12 months Vitals Screenings to include cognitive, depression, and falls Referrals and appointments  In addition, I have reviewed and discussed with patient certain preventive protocols, quality metrics, and best practice recommendations. A  written personalized care plan for preventive services as well as general preventive health recommendations were provided to patient.   Verdie CHRISTELLA Saba, CMA   09/08/2024   After Visit Summary: (In Person-Declined) Patient declined AVS at this time.  Notes: Nothing significant to report at this time.

## 2024-10-02 ENCOUNTER — Other Ambulatory Visit (HOSPITAL_COMMUNITY): Payer: Self-pay

## 2024-10-13 ENCOUNTER — Telehealth: Payer: Self-pay

## 2024-10-13 NOTE — Telephone Encounter (Signed)
 Prolia  VOB initiated via [email or web address]  Next Prolia  inj DUE: 11/13/24

## 2024-10-14 ENCOUNTER — Other Ambulatory Visit (HOSPITAL_COMMUNITY): Payer: Self-pay

## 2024-10-14 NOTE — Telephone Encounter (Signed)
 SABRA

## 2024-10-14 NOTE — Telephone Encounter (Signed)
 Pt ready for scheduling for PROLIA  on or after : 11/13/24  Option# 1: Buy/Bill (Office supplied medication)  Out-of-pocket cost due at time of clinic visit: $367  Number of injection/visits approved: 2  Primary: HUMANA Prolia  co-insurance: 20% Admin fee co-insurance: $35  Secondary: --- Prolia  co-insurance:  Admin fee co-insurance:   Medical Benefit Details: Date Benefits were checked: 10/13/24 Deductible: NO/ Coinsurance: 20%/ Admin Fee: $35  Prior Auth: APPROVED PA# [id number] Expiration Date: 01/01/24-12/30/24   # of doses approved: 2 ----------------------------------------------------------------------- Option# 2- Med Obtained from pharmacy:  Pharmacy benefit: Copay $50 (Paid to pharmacy) Admin Fee: $35 (Pay at clinic)  Prior Auth: N/A PA# Expiration Date:   # of doses approved:   If patient wants fill through the pharmacy benefit please send prescription to: [facility], and include estimated need by date in rx notes. Pharmacy will ship medication directly to the office.  Patient NOT eligible for Prolia  Copay Card. Copay Card can make patient's cost as little as $25. Link to apply: [email or web address]://www.[email or web address].[email or web address]  ** This summary of benefits is an estimation of the patient's out-of-pocket cost. Exact cost may very based on individual plan coverage.

## 2024-10-22 ENCOUNTER — Ambulatory Visit: Admitting: Podiatry

## 2024-10-22 ENCOUNTER — Inpatient Hospital Stay: Admission: RE | Admit: 2024-10-22 | Source: Ambulatory Visit

## 2024-10-23 ENCOUNTER — Ambulatory Visit (INDEPENDENT_AMBULATORY_CARE_PROVIDER_SITE_OTHER)
Admission: RE | Admit: 2024-10-23 | Discharge: 2024-10-23 | Disposition: A | Discharge status: Home / Self Care | Source: Ambulatory Visit | Attending: Internal Medicine | Admitting: Internal Medicine

## 2024-10-23 DIAGNOSIS — M810 Age-related osteoporosis without current pathological fracture: Secondary | ICD-10-CM

## 2024-10-25 ENCOUNTER — Ambulatory Visit: Payer: Self-pay | Admitting: Internal Medicine

## 2024-11-05 NOTE — Telephone Encounter (Signed)
 Attempted to reach patient today but VM was not set up.  My-chart sent to her today.

## 2024-11-09 ENCOUNTER — Other Ambulatory Visit: Payer: Self-pay

## 2024-11-09 ENCOUNTER — Other Ambulatory Visit (HOSPITAL_BASED_OUTPATIENT_CLINIC_OR_DEPARTMENT_OTHER): Payer: Self-pay

## 2024-11-09 DIAGNOSIS — M810 Age-related osteoporosis without current pathological fracture: Secondary | ICD-10-CM

## 2024-11-09 MED ORDER — DENOSUMAB 60 MG/ML ~~LOC~~ SOSY
60.0000 mg | PREFILLED_SYRINGE | Freq: Once | SUBCUTANEOUS | 0 refills | Status: AC
  Filled 2024-11-09: qty 1, 1d supply, fill #0
  Filled 2024-11-09: qty 1, 180d supply, fill #0

## 2024-11-09 NOTE — Progress Notes (Signed)
 Specialty Pharmacy Initial Fill Coordination Note  Regina Ramsey is a 83 y.o. female contacted today regarding initial fill of specialty medication(s) Denosumab  (PROLIA )   Patient requested Courier to Provider Office   Delivery date: 11/12/24   Verified address: [facility] [address]   Medication will be filled on: 11/11/24   Patient is aware of $50 copayment.

## 2024-11-09 NOTE — Progress Notes (Signed)
 Pharmacy Patient Advocate Encounter  Insurance verification completed.   The patient is insured through [facility]   Ran test claim for Prolia . Co-pay is $50.  This test claim was processed through [facility]- copay amounts may vary at other pharmacies due to pharmacy/plan contracts, or as the patient moves through the different stages of their insurance plan.

## 2024-11-11 ENCOUNTER — Other Ambulatory Visit: Payer: Self-pay

## 2024-11-11 NOTE — Progress Notes (Signed)
 Cardiology Office Note   Date:  11/12/2024  ID:  Tabby, Beaston 1941-06-21, MRN [id number] PCP: Geofm Glade PARAS, MD  [facility] Providers Cardiologist:  Stanly DELENA Leavens, MD   History of Present Illness Regina Ramsey is a 83 y.o. female with a past medical history of hypertension and coronary artery disease who was previously a patient of Dr. Mona and Dr. Hiram.  Seen to establish care back in July.  Has a history of hypertension, coronary artery disease without angina and hyperlipidemia.  Her blood pressure is usually in the 150s and has been off metoprolol  for a month due to difficulties in obtaining it.  she continue lisinopril  5 mg twice a day and atorvastatin  80 mg daily.  When she was last into visit us  she was having shortness of breath when she was bending over to assist her husband which resolves about after an hour.  No shortness of breath during other activities such as walking or climbing stairs but she notes that she does not have to bend over during these activities.  Describes herself as not very physically active primarily due to her caregiving responsibilities for her husband.  Medication regimen includes atorvastatin  80 mg daily for hyperlipidemia, lisinopril  5 mg twice a day for hypertension and metoprolol  succinate for coronary artery disease.  She also takes aspirin .  Noted some swelling in her lower extremity which was a new symptom.  Scheduled to see a podiatrist for her foot issues.  History of heart murmur which she states she has had all of her life.  Today, she presents with hx of shortness of breath and for cardiovascular medication management. Lisinopril  dosage was increased from 5 mg to 10 mg daily, with no issues reported on this regimen. Shortness of breath occurred when bending over, particularly during activities like assisting her husband with dressing. She has since avoided bending over and has not experienced further episodes.  An echocardiogram on August 27th showed normal heart pump function with mild diastolic dysfunction. She is on Lipitor but has not had recent cholesterol labs since April of last year. There is no chest pain or current shortness of breath since modifying activities. She has a long-standing heart murmur. Physical activity includes regular movement while caring for her husband. Medications are managed with a pill organizer.  Reports no shortness of breath nor dyspnea on exertion. Reports no chest pain, pressure, or tightness. No edema, orthopnea, PND. Reports no palpitations.   Discussed the use of AI scribe software for clinical note transcription with the patient, who gave verbal consent to proceed.   ROS: Pertinent ROS in HPI  Studies Reviewed     Echocardiogram 08/26/2024 IMPRESSIONS     1. Left ventricular ejection fraction, by estimation, is 60 to 65%. The  left ventricle has normal function. The left ventricle has no regional  wall motion abnormalities. Left ventricular diastolic parameters are  consistent with Grade I diastolic  dysfunction (impaired relaxation). Elevated left atrial pressure.   2. Right ventricular systolic function is normal. The right ventricular  size is normal.   3. The mitral valve is normal in structure. Trivial mitral valve  regurgitation. No evidence of mitral stenosis.   4. The aortic valve is tricuspid. Aortic valve regurgitation is not  visualized. Aortic valve sclerosis is present, with no evidence of aortic  valve stenosis.   5. The inferior vena cava is normal in size with greater than 50%  respiratory variability, suggesting right atrial pressure of 3  mmHg.   Physical Exam VS:  BP 134/62   Pulse 64   Ht 4' 10 (1.473 m)   Wt 130 lb (59 kg)   SpO2 95%   BMI 27.17 kg/m        Wt Readings from Last 3 Encounters:  11/12/24 130 lb (59 kg)  09/08/24 125 lb 12.8 oz (57.1 kg)  07/22/24 129 lb (58.5 kg)    GEN: Well nourished, well developed in  no acute distress NECK: No JVD; No carotid bruits CARDIAC: RRR, + systolic murmurs, rubs, gallops RESPIRATORY:  Clear to auscultation without rales, wheezing or rhonchi  ABDOMEN: Soft, non-tender, non-distended EXTREMITIES:  No edema; No deformity   ASSESSMENT AND PLAN  Hypertension Blood pressure controlled on lisinopril  10 mg once daily. - Continue lisinopril  10 mg once daily. - Monitor blood pressure regularly. - Consider adding second dose if blood pressure increases. (BID dosing)  Hyperlipidemia On Lipitor. Due for cholesterol and liver function tests. - Ordered fasting lipid panel and liver function tests.  Diastolic dysfunction Echocardiogram showed mild diastolic dysfunction. Symptoms managed without medication. - Avoid activities that exacerbate shortness of breath. - Consider low dose diuretic like Lasix if symptoms persist, monitor kidney function.  Leg Edema -has improved since her last visit  Heart Murmur -echo reviewed with mild diastolic dysfunction and mild LA pressure increase - Since there is no shortness of breath, we held off on diuretics today     Dispo: She can follow-up in 6 months with MD  Signed, Regina LOISE Fabry, PA-C

## 2024-11-12 ENCOUNTER — Ambulatory Visit: Attending: Physician Assistant | Admitting: Physician Assistant

## 2024-11-12 ENCOUNTER — Other Ambulatory Visit (HOSPITAL_COMMUNITY): Payer: Self-pay

## 2024-11-12 ENCOUNTER — Other Ambulatory Visit: Payer: Self-pay

## 2024-11-12 VITALS — BP 134/62 | HR 64 | Ht <= 58 in | Wt 130.0 lb

## 2024-11-12 DIAGNOSIS — R011 Cardiac murmur, unspecified: Secondary | ICD-10-CM | POA: Diagnosis not present

## 2024-11-12 DIAGNOSIS — I10 Essential (primary) hypertension: Secondary | ICD-10-CM | POA: Diagnosis not present

## 2024-11-12 DIAGNOSIS — I447 Left bundle-branch block, unspecified: Secondary | ICD-10-CM | POA: Diagnosis not present

## 2024-11-12 DIAGNOSIS — R931 Abnormal findings on diagnostic imaging of heart and coronary circulation: Secondary | ICD-10-CM

## 2024-11-12 DIAGNOSIS — E782 Mixed hyperlipidemia: Secondary | ICD-10-CM

## 2024-11-12 MED ORDER — ASPIRIN EC 81 MG PO TBEC: 81.0000 mg | DELAYED_RELEASE_TABLET | Freq: Every day | ORAL | 3 refills | Status: AC

## 2024-11-12 MED ORDER — METOPROLOL SUCCINATE ER 50 MG PO TB24
50.0000 mg | ORAL_TABLET | Freq: Every day | ORAL | 3 refills | Status: AC
Start: 2024-11-12 — End: ?
  Filled 2024-11-12: qty 90, 90d supply, fill #0

## 2024-11-12 MED ORDER — LISINOPRIL 10 MG PO TABS
10.0000 mg | ORAL_TABLET | Freq: Every day | ORAL | 3 refills | Status: AC
  Filled 2024-11-12 – 2025-01-11 (×3): qty 90, 90d supply, fill #0

## 2024-11-12 MED ORDER — ATORVASTATIN CALCIUM 40 MG PO TABS
80.0000 mg | ORAL_TABLET | Freq: Every day | ORAL | 3 refills | Status: AC
  Filled 2024-11-12: qty 90, 45d supply, fill #0
  Filled 2024-12-20: qty 90, 45d supply, fill #1
  Filled 2025-02-03: qty 90, 45d supply, fill #2

## 2024-11-12 NOTE — Patient Instructions (Addendum)
 medication Instructions:  NO CHANGES  Lab Work: NONE TO BE DONE TODAY.  Testing/Procedures: NONE  Follow-Up: At [facility], you and your health needs are our priority.  As part of our continuing mission to provide you with exceptional heart care, our providers are all part of one team.  This team includes your primary Cardiologist (physician) and Advanced Practice Providers or APPs (Physician Assistants and Nurse Practitioners) who all work together to provide you with the care you need, when you need it.  Your next appointment:   6 MONTHS  Provider:   Stanly DELENA Leavens, MD     Other Instructions: Please check your blood pressure daily for two weeks, then contact the office with your readings  Please contact the office with your readings either by phone, by dropping it off in person, or by sending it through MyChart.   Be sure to check your blood pressure one to two hours after taking your medications.  Avoid the following for 30 minutes before checking your blood pressure: No caffeine No alcohol No eating No smoking  No exercise  Five minutes before checking your blood pressure: Use the restroom Sit up straight in a chair with your back supported and feet flat on the floor Remain quiet and do not talk

## 2024-11-28 ENCOUNTER — Other Ambulatory Visit (HOSPITAL_COMMUNITY): Payer: Self-pay

## 2025-01-11 ENCOUNTER — Other Ambulatory Visit (HOSPITAL_COMMUNITY): Payer: Self-pay

## 2025-01-11 ENCOUNTER — Other Ambulatory Visit: Payer: Self-pay

## 2025-01-19 ENCOUNTER — Other Ambulatory Visit (HOSPITAL_COMMUNITY): Payer: Self-pay

## 2025-01-19 ENCOUNTER — Other Ambulatory Visit: Payer: Self-pay

## 2025-01-19 MED ORDER — PSEUDOEPH-BROMPHEN-DM 30-2-10 MG/5ML PO SYRP
5.0000 mL | ORAL_SOLUTION | ORAL | 0 refills | Status: AC | PRN
  Filled 2025-01-19 – 2025-01-20 (×2): qty 120, 4d supply, fill #0

## 2025-01-19 MED ORDER — METHYLPREDNISOLONE 4 MG PO TBPK
ORAL_TABLET | ORAL | 0 refills | Status: AC
  Filled 2025-01-19 – 2025-01-20 (×2): qty 21, 6d supply, fill #0

## 2025-01-19 MED ORDER — ALBUTEROL SULFATE HFA 108 (90 BASE) MCG/ACT IN AERS
2.0000 | INHALATION_SPRAY | RESPIRATORY_TRACT | 1 refills | Status: AC | PRN
  Filled 2025-01-19: qty 6.7, 17d supply, fill #0
  Filled 2025-01-20: qty 6.7, 16d supply, fill #0

## 2025-01-19 MED ORDER — AZITHROMYCIN 250 MG PO TABS
ORAL_TABLET | ORAL | 0 refills | Status: AC
  Filled 2025-01-19 – 2025-01-20 (×2): qty 6, 5d supply, fill #0

## 2025-01-20 ENCOUNTER — Other Ambulatory Visit (HOSPITAL_COMMUNITY): Payer: Self-pay

## 2025-01-20 ENCOUNTER — Encounter: Payer: Self-pay | Admitting: Pharmacist

## 2025-01-20 ENCOUNTER — Other Ambulatory Visit: Payer: Self-pay

## 2025-01-21 ENCOUNTER — Telehealth: Payer: Self-pay

## 2025-01-21 NOTE — Telephone Encounter (Signed)
 Pt is needing a PA for her Prolia  injec as she is schedule to come in the office for her prolia  in March.

## 2025-01-22 ENCOUNTER — Telehealth: Payer: Self-pay

## 2025-01-22 ENCOUNTER — Other Ambulatory Visit (HOSPITAL_COMMUNITY): Payer: Self-pay

## 2025-01-22 NOTE — Telephone Encounter (Addendum)
 Prolia  VOB initiated via [email or web address]  Next Prolia  inj DUE:    PHARMACY BENEFIT: $50

## 2025-01-22 NOTE — Telephone Encounter (Signed)
 Prolia  BIV in separate encounter. Will route encounter back once benefit verification is complete.    Please follow CAD Authorization workflow and place a CAM order to initiate Prolia  Benefits investigation. Standard turnaround time is 2 weeks.

## 2025-01-25 ENCOUNTER — Other Ambulatory Visit (HOSPITAL_COMMUNITY): Payer: Self-pay

## 2025-01-25 NOTE — Telephone Encounter (Signed)
 SABRA

## 2025-01-25 NOTE — Telephone Encounter (Signed)
 MEDICAL PA SUBMITTED VIA LATENT. KEY: [id number]

## 2025-01-26 NOTE — Telephone Encounter (Signed)
 Pt ready for scheduling for PROLIA  on or after : 02/2025  Option# 1: Buy/Bill (Office supplied medication)  Out-of-pocket cost due at time of clinic visit: $0  Number of injection/visits approved: 2  Primary: [employer] Prolia  co-insurance: 0% Admin fee co-insurance: 0%  Secondary: --- Prolia  co-insurance:  Admin fee co-insurance:   Medical Benefit Details: Date Benefits were checked: 01/22/25 Deductible: NO/ Coinsurance: 0%/ Admin Fee: 0%  Prior Auth: APPROVED PA# [id number] Expiration Date: 12/31/21-12/30/25  # of doses approved: 2 ----------------------------------------------------------------------- Option# 2- Med Obtained from pharmacy:  Pharmacy benefit: Copay $50 (Paid to pharmacy) Admin Fee: 0% (Pay at clinic)  Prior Auth: N/A PA# Expiration Date:   # of doses approved:   If patient wants fill through the pharmacy benefit please send prescription to: [facility], and include estimated need by date in rx notes. Pharmacy will ship medication directly to the office.  Patient NOT eligible for Prolia  Copay Card. Copay Card can make patient's cost as little as $25. Link to apply: [email or web address]  ** This summary of benefits is an estimation of the patient's out-of-pocket cost. Exact cost may very based on individual plan coverage.

## 2025-01-28 NOTE — Telephone Encounter (Signed)
 Pt has been informed of her apptmnt for her Prolia  injection on 03/01/2025 at 10 am.

## 2025-02-03 ENCOUNTER — Other Ambulatory Visit (HOSPITAL_COMMUNITY): Payer: Self-pay

## 2025-03-01 ENCOUNTER — Ambulatory Visit

## 2025-09-14 ENCOUNTER — Ambulatory Visit
